# Patient Record
Sex: Female | Born: 1985
Health system: Southern US, Community
[De-identification: ages and names within clinical notes are randomized; demographics above are authoritative.]

## PROBLEM LIST (undated history)

## (undated) ENCOUNTER — Inpatient Hospital Stay (HOSPITAL_COMMUNITY): Payer: Self-pay

## (undated) DIAGNOSIS — D759 Disease of blood and blood-forming organs, unspecified: Secondary | ICD-10-CM

## (undated) DIAGNOSIS — K219 Gastro-esophageal reflux disease without esophagitis: Secondary | ICD-10-CM

## (undated) DIAGNOSIS — D696 Thrombocytopenia, unspecified: Secondary | ICD-10-CM

## (undated) DIAGNOSIS — E079 Disorder of thyroid, unspecified: Secondary | ICD-10-CM

## (undated) DIAGNOSIS — E039 Hypothyroidism, unspecified: Secondary | ICD-10-CM

## (undated) DIAGNOSIS — O99119 Other diseases of the blood and blood-forming organs and certain disorders involving the immune mechanism complicating pregnancy, unspecified trimester: Secondary | ICD-10-CM

## (undated) DIAGNOSIS — Q379 Unspecified cleft palate with unilateral cleft lip: Secondary | ICD-10-CM

## (undated) HISTORY — DX: Unspecified cleft palate with unilateral cleft lip: Q37.9

## (undated) HISTORY — DX: Disorder of thyroid, unspecified: E07.9

---
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
## 2012-12-25 ENCOUNTER — Other Ambulatory Visit: Payer: Self-pay | Admitting: Obstetrics and Gynecology

## 2012-12-27 ENCOUNTER — Ambulatory Visit (INDEPENDENT_AMBULATORY_CARE_PROVIDER_SITE_OTHER): Payer: Commercial Managed Care - PPO | Admitting: Obstetrics and Gynecology

## 2012-12-27 ENCOUNTER — Encounter: Payer: Self-pay | Admitting: Obstetrics and Gynecology

## 2012-12-27 ENCOUNTER — Encounter (INDEPENDENT_AMBULATORY_CARE_PROVIDER_SITE_OTHER): Payer: Self-pay

## 2012-12-27 ENCOUNTER — Other Ambulatory Visit (HOSPITAL_COMMUNITY)
Admission: RE | Admit: 2012-12-27 | Discharge: 2012-12-27 | Disposition: A | Discharge status: Home / Self Care | Payer: 59 | Source: Ambulatory Visit | Attending: Obstetrics and Gynecology | Admitting: Obstetrics and Gynecology

## 2012-12-27 VITALS — BP 120/82 | Ht 65.0 in | Wt 139.0 lb

## 2012-12-27 DIAGNOSIS — Z3200 Encounter for pregnancy test, result unknown: Secondary | ICD-10-CM

## 2012-12-27 DIAGNOSIS — Z113 Encounter for screening for infections with a predominantly sexual mode of transmission: Secondary | ICD-10-CM | POA: Insufficient documentation

## 2012-12-27 DIAGNOSIS — Z01419 Encounter for gynecological examination (general) (routine) without abnormal findings: Secondary | ICD-10-CM | POA: Insufficient documentation

## 2012-12-27 DIAGNOSIS — Z0000 Encounter for general adult medical examination without abnormal findings: Secondary | ICD-10-CM

## 2012-12-27 DIAGNOSIS — Z3201 Encounter for pregnancy test, result positive: Secondary | ICD-10-CM

## 2012-12-27 NOTE — Progress Notes (Signed)
Patient ID: Kimberly Ramsey, female   DOB: 04/01/1985, 27 y.o.   MRN: [id number] Pt here for annual pap and physical. Pt requested a UPT, it was positive. Pt denies any bleeding or cramping.   Assessment:  Annual Gyn Exam   Plan:  1. pap smear done, 2. return annually or prn 3. If termination done, pt to call for contraception. 3    Annual mammogram advised Subjective:  Kimberly Ramsey is a 27 y.o. female G2P2002 who presents for annual exam. Patient's last menstrual period was 11/13/2012. The patient has complaints today of lmp OCT 6  The following portions of the patient's history were reviewed and updated as appropriate: allergies, current medications, past family history, past medical history, past social history, past surgical history and problem list.  Review of Systems Constitutional: negative Gastrointestinal: negative Genitourinary: neg   Objective:  BP 120/82  Ht 5\' 5"  (1.651 m)  Wt 139 lb (63.05 kg)  BMI 23.13 kg/m2  LMP 11/13/2012   BMI: Body mass index is 23.13 kg/(m^2).  General Appearance: Alert, appropriate appearance for age. No acute distress HEENT: Grossly normal Neck / Thyroid:  Cardiovascular: RRR; normal S1, S2, no murmur Lungs: CTA bilaterally Back: No CVAT Breast Exam: defer by pt,  Gastrointestinal: Soft, non-tender, no masses or organomegaly Pelvic Exam: Cervix: normal appearance Adnexa: normal bimanual exam Uterus: retroverted and 4-6 wk size Rectovaginal: not indicated Lymphatic Exam: Non-palpable nodes in neck, clavicular, axillary, or inguinal regions Skin: no rash or abnormalities Neurologic: Normal gait and speech, no tremor  Psychiatric: Alert and oriented, appropriate affect.  Urinalysis:Not done  Christin Bach. MD Pgr [phone] 2:27 PM

## 2013-01-08 ENCOUNTER — Other Ambulatory Visit: Payer: Self-pay | Admitting: Obstetrics & Gynecology

## 2013-01-08 DIAGNOSIS — O3680X0 Pregnancy with inconclusive fetal viability, not applicable or unspecified: Secondary | ICD-10-CM

## 2013-01-10 ENCOUNTER — Encounter: Payer: Self-pay | Admitting: Obstetrics & Gynecology

## 2013-01-10 ENCOUNTER — Other Ambulatory Visit: Payer: Self-pay | Admitting: Obstetrics & Gynecology

## 2013-01-10 ENCOUNTER — Ambulatory Visit (INDEPENDENT_AMBULATORY_CARE_PROVIDER_SITE_OTHER): Payer: Commercial Managed Care - PPO

## 2013-01-10 DIAGNOSIS — O3680X0 Pregnancy with inconclusive fetal viability, not applicable or unspecified: Secondary | ICD-10-CM

## 2013-01-10 DIAGNOSIS — O26849 Uterine size-date discrepancy, unspecified trimester: Secondary | ICD-10-CM

## 2013-01-10 NOTE — Progress Notes (Signed)
U/S-transabdominal u/s performed, single IUP with +FCA noted, FHR-152 bpm, CRL c/w 7+3wks EDD 08/26/2013,  cx long and closed, bilateral adnexa WNL

## 2013-01-16 ENCOUNTER — Ambulatory Visit: Payer: Commercial Managed Care - PPO | Admitting: Women's Health

## 2013-01-16 ENCOUNTER — Encounter: Payer: Self-pay | Admitting: Women's Health

## 2013-01-16 VITALS — BP 130/68 | Wt 138.0 lb

## 2013-01-16 DIAGNOSIS — Z3480 Encounter for supervision of other normal pregnancy, unspecified trimester: Secondary | ICD-10-CM | POA: Insufficient documentation

## 2013-01-16 DIAGNOSIS — Z1389 Encounter for screening for other disorder: Secondary | ICD-10-CM

## 2013-01-16 DIAGNOSIS — O09291 Supervision of pregnancy with other poor reproductive or obstetric history, first trimester: Secondary | ICD-10-CM

## 2013-01-16 DIAGNOSIS — O09299 Supervision of pregnancy with other poor reproductive or obstetric history, unspecified trimester: Secondary | ICD-10-CM | POA: Insufficient documentation

## 2013-01-16 DIAGNOSIS — O219 Vomiting of pregnancy, unspecified: Secondary | ICD-10-CM

## 2013-01-16 DIAGNOSIS — Z331 Pregnant state, incidental: Secondary | ICD-10-CM

## 2013-01-16 DIAGNOSIS — Z8279 Family history of other congenital malformations, deformations and chromosomal abnormalities: Secondary | ICD-10-CM

## 2013-01-16 DIAGNOSIS — Z87730 Personal history of (corrected) cleft lip and palate: Secondary | ICD-10-CM | POA: Insufficient documentation

## 2013-01-16 DIAGNOSIS — Z3481 Encounter for supervision of other normal pregnancy, first trimester: Secondary | ICD-10-CM

## 2013-01-16 HISTORY — DX: Supervision of pregnancy with other poor reproductive or obstetric history, unspecified trimester: O09.299

## 2013-01-16 LAB — POCT URINALYSIS DIPSTICK
Glucose, UA: NEGATIVE
Leukocytes, UA: NEGATIVE
Nitrite, UA: NEGATIVE
Protein, UA: NEGATIVE

## 2013-01-16 LAB — CBC
HCT: 42.3 % (ref 36.0–46.0)
MCHC: 35.2 g/dL (ref 30.0–36.0)
Platelets: 174 10*3/uL (ref 150–400)
RBC: 5.25 MIL/uL — ABNORMAL HIGH (ref 3.87–5.11)
RDW: 14.5 % (ref 11.5–15.5)
WBC: 9.7 10*3/uL (ref 4.0–10.5)

## 2013-01-16 MED ORDER — DOXYLAMINE-PYRIDOXINE 10-10 MG PO TBEC: 10.0000 mg | DELAYED_RELEASE_TABLET | ORAL | Status: DC

## 2013-01-16 MED ORDER — FOLIC ACID 1 MG PO TABS: 4.0000 mg | ORAL_TABLET | Freq: Every day | ORAL | Status: DC

## 2013-01-16 NOTE — Progress Notes (Signed)
  Subjective:    Kimberly Ramsey is a 27 y.o. G69P2002 African American female at [redacted]w[redacted]d by 7wk u/s, being seen today for her first obstetrical visit.  Her obstetrical history is significant for uncomplicated term svd x 2. 6yo daughter w/ down syndrome- normal echo, no problems. Pt born w/ cleft lip/palate, s/p 3 reconstructive surgeries including removal of bone from Lt hip to repair palate.  She also states she had idiopathic thrombocytopenia during both pregnancies.  Pregnancy history fully reviewed. Had neg pap Nov 2014.   Patient reports daily nausea, occ vomiting. Requests antiemetic.  Denies uti s/s, vb, cramping, abnormal/malodorous vag d/c, vulvovaginal itching irritation.   Filed Vitals:   01/16/13 1522  BP: 130/68  Weight: 138 lb (62.596 kg)    HISTORY: OB History  Gravida Para Term Preterm AB SAB TAB Ectopic Multiple Living  3 2 2       2     # Outcome Date GA Lbr Len/2nd Weight Sex Delivery Anes PTL Lv  3 CUR           2 TRM 2013 [redacted]w[redacted]d  7 lb 11 oz (3.487 kg) F SVD   Y  1 TRM 2008 [redacted]w[redacted]d  7 lb 12 oz (3.515 kg) F SVD   Y     Past Medical History  Diagnosis Date  . Cleft lip and palate    Past Surgical History  Procedure Laterality Date  . Cleft palate repair    . Hip surgery      took bone from hip to fix cleft palate.   Family History  Problem Relation Age of Onset  . Mental illness Mother   . Schizophrenia Mother   . Lupus Father      Exam   System:     Skin: normal coloration and turgor, no rashes    Neurologic: oriented, normal mood   Extremities: normal strength, tone, and muscle mass   HEENT PERRLA   Mouth/Teeth mucous membranes moist   Cardiovascular: regular rate and rhythm   Respiratory:  appears well, vitals normal, no respiratory distress, acyanotic, normal RR   Abdomen: soft, non-tender    Thin prep pap smear not obtained, neg pap Nov 2014    Assessment:    Pregnancy: [id number] Patient Active Problem List   Diagnosis Date Noted  .  Supervision of other normal pregnancy 01/16/2013    Priority: High  . Down syndrome in child of prior pregnancy, currently pregnant 01/16/2013    Priority: High  . H/O cleft lip/palate 01/16/2013    Priority: High      [redacted]w[redacted]d G3P2002 New OB visit Cleft lip/palate s/p 3 reconstructive surgeries Child w/ down syndrome N/V pregnancy     Plan:     Initial labs drawn Continue prenatal vitamins Problem list reviewed and updated Reviewed n/v relief measures and warning s/s to report Rx diclegis, prior auth sent, and 4 samples given.  Reviewed recommended weight gain based on pre-gravid BMI Encouraged well-balanced diet Genetic Screening discussed Integrated Screen: requested Cystic fibrosis screening discussed requested Ultrasound discussed; fetal survey: requested Follow up in 4 weeks for 1st IT/NT and lrob Rx folic acid 4mg  daily   Marge Duncans 01/16/2013 4:04 PM

## 2013-01-16 NOTE — Patient Instructions (Signed)
Nausea & Vomiting  Have saltine crackers or pretzels by your bed and eat a few bites before you raise your head out of bed in the morning  Eat small frequent meals throughout the day instead of large meals  Drink plenty of fluids throughout the day to stay hydrated, just don't drink a lot of fluids with your meals.  This can make your stomach fill up faster making you feel sick  Do not brush your teeth right after you eat  Products with real ginger are good for nausea, like ginger ale and ginger hard candy Make sure it says made with real ginger!  Sucking on sour candy like lemon heads is also good for nausea  If your prenatal vitamins make you nauseated, take them at night so you will sleep through the nausea  If you feel like you need medicine for the nausea & vomiting please let us know  If you are unable to keep any fluids or food down please let us know   Constipation  Drink plenty of fluid, preferably water, throughout the day  Eat foods high in fiber such as fruits, vegetables, and grains  Exercise, such as walking, is a good way to keep your bowels regular  Drink warm fluids, especially warm prune juice, or decaf coffee  Eat a 1/2 cup of real oatmeal (not instant), 1/2 cup applesauce, and 1/2-1 cup warm prune juice every day  If needed, you may take Colace (docusate sodium) stool softener once or twice a day to help keep the stool soft. If you are pregnant, wait until you are out of your first trimester (12-14 weeks of pregnancy)  If you still are having problems with constipation, you may take Miralax once daily as needed to help keep your bowels regular.  If you are pregnant, wait until you are out of your first trimester (12-14 weeks of pregnancy)    Pregnancy - First Trimester During sexual intercourse, millions of sperm go into the vagina. Only 1 sperm will penetrate and fertilize the female egg while it is in the Fallopian tube. One week later, the fertilized egg  implants into the wall of the uterus. An embryo begins to develop into a baby. At 6 to 8 weeks, the eyes and face are formed and the heartbeat can be seen on ultrasound. At the end of 12 weeks (first trimester), all the baby's organs are formed. Now that you are pregnant, you will want to do everything you can to have a healthy baby. Two of the most important things are to get good prenatal care and follow your caregiver's instructions. Prenatal care is all the medical care you receive before the baby's birth. It is given to prevent, find, and treat problems during the pregnancy and childbirth. PRENATAL EXAMS  During prenatal visits, your weight, blood pressure, and urine are checked. This is done to make sure you are healthy and progressing normally during the pregnancy.  A pregnant woman should gain 25 to 35 pounds during the pregnancy. However, if you are overweight or underweight, your caregiver will advise you regarding your weight.  Your caregiver will ask and answer questions for you.  Blood work, cervical cultures, other necessary tests, and a Pap test are done during your prenatal exams. These tests are done to check on your health and the probable health of your baby. Tests are strongly recommended and done for HIV with your permission. This is the virus that causes AIDS. These tests are done because medicines   can be given to help prevent your baby from being born with this infection should you have been infected without knowing it. Blood work is also used to find out your blood type, previous infections, and follow your blood levels (hemoglobin).  Low hemoglobin (anemia) is common during pregnancy. Iron and vitamins are given to help prevent this. Later in the pregnancy, blood tests for diabetes will be done along with any other tests if any problems develop.  You may need other tests to make sure you and the baby are doing well. CHANGES DURING THE FIRST TRIMESTER  Your body goes through  many changes during pregnancy. They vary from person to person. Talk to your caregiver about changes you notice and are concerned about. Changes can include:  Your menstrual period stops.  The egg and sperm carry the genes that determine what you look like. Genes from you and your partner are forming a baby. The female genes determine whether the baby is a boy or a girl.  Your body increases in girth and you may feel bloated.  Feeling sick to your stomach (nauseous) and throwing up (vomiting). If the vomiting is uncontrollable, call your caregiver.  Your breasts will begin to enlarge and become tender.  Your nipples may stick out more and become darker.  The need to urinate more. Painful urination may mean you have a bladder infection.  Tiring easily.  Loss of appetite.  Cravings for certain kinds of food.  At first, you may gain or lose a couple of pounds.  You may have changes in your emotions from day to day (excited to be pregnant or concerned something may go wrong with the pregnancy and baby).  You may have more vivid and strange dreams. HOME CARE INSTRUCTIONS   It is very important to avoid all smoking, alcohol and non-prescribed drugs during your pregnancy. These affect the formation and growth of the baby. Avoid chemicals while pregnant to ensure the delivery of a healthy infant.  Start your prenatal visits by the 12th week of pregnancy. They are usually scheduled monthly at first, then more often in the last 2 months before delivery. Keep your caregiver's appointments. Follow your caregiver's instructions regarding medicine use, blood and lab tests, exercise, and diet.  During pregnancy, you are providing food for you and your baby. Eat regular, well-balanced meals. Choose foods such as meat, fish, milk and other low fat dairy products, vegetables, fruits, and whole-grain breads and cereals. Your caregiver will tell you of the ideal weight gain.  You can help morning  sickness by keeping soda crackers at the bedside. Eat a couple before arising in the morning. You may want to use the crackers without salt on them.  Eating 4 to 5 small meals rather than 3 large meals a day also may help the nausea and vomiting.  Drinking liquids between meals instead of during meals also seems to help nausea and vomiting.  A physical sexual relationship may be continued throughout pregnancy if there are no other problems. Problems may be early (premature) leaking of amniotic fluid from the membranes, vaginal bleeding, or belly (abdominal) pain.  Exercise regularly if there are no restrictions. Check with your caregiver or physical therapist if you are unsure of the safety of some of your exercises. Greater weight gain will occur in the last 2 trimesters of pregnancy. Exercising will help:  Control your weight.  Keep you in shape.  Prepare you for labor and delivery.  Help you lose your pregnancy   weight after you deliver your baby.  Wear a good support or jogging bra for breast tenderness during pregnancy. This may help if worn during sleep too.  Ask when prenatal classes are available. Begin classes when they are offered.  Do not use hot tubs, steam rooms, or saunas.  Wear your seat belt when driving. This protects you and your baby if you are in an accident.  Avoid raw meat, uncooked cheese, cat litter boxes, and soil used by cats throughout the pregnancy. These carry germs that can cause birth defects in the baby.  The first trimester is a good time to visit your dentist for your dental health. Getting your teeth cleaned is okay. Use a softer toothbrush and brush gently during pregnancy.  Ask for help if you have financial, counseling, or nutritional needs during pregnancy. Your caregiver will be able to offer counseling for these needs as well as refer you for other special needs.  Do not take any medicines or herbs unless told by your caregiver.  Inform your  caregiver if there is any mental or physical domestic violence.  Make a list of emergency phone numbers of family, friends, hospital, and police and fire departments.  Write down your questions. Take them to your prenatal visit.  Do not douche.  Do not cross your legs.  If you have to stand for long periods of time, rotate you feet or take small steps in a circle.  You may have more vaginal secretions that may require a sanitary pad. Do not use tampons or scented sanitary pads. MEDICINES AND DRUG USE IN PREGNANCY  Take prenatal vitamins as directed. The vitamin should contain 1 milligram of folic acid. Keep all vitamins out of reach of children. Only a couple vitamins or tablets containing iron may be fatal to a baby or young child when ingested.  Avoid use of all medicines, including herbs, over-the-counter medicines, not prescribed or suggested by your caregiver. Only take over-the-counter or prescription medicines for pain, discomfort, or fever as directed by your caregiver. Do not use aspirin, ibuprofen, or naproxen unless directed by your caregiver.  Let your caregiver also know about herbs you may be using.  Alcohol is related to a number of birth defects. This includes fetal alcohol syndrome. All alcohol, in any form, should be avoided completely. Smoking will cause low birth rate and premature babies.  Street or illegal drugs are very harmful to the baby. They are absolutely forbidden. A baby born to an addicted mother will be addicted at birth. The baby will go through the same withdrawal an adult does.  Let your caregiver know about any medicines that you have to take and for what reason you take them. SEEK MEDICAL CARE IF:  You have any concerns or worries during your pregnancy. It is better to call with your questions if you feel they cannot wait, rather than worry about them. SEEK IMMEDIATE MEDICAL CARE IF:   An unexplained oral temperature above 102 F (38.9 C) develops,  or as your caregiver suggests.  You have leaking of fluid from the vagina (birth canal). If leaking membranes are suspected, take your temperature and inform your caregiver of this when you call.  There is vaginal spotting or bleeding. Notify your caregiver of the amount and how many pads are used.  You develop a bad smelling vaginal discharge with a change in the color.  You continue to feel sick to your stomach (nauseated) and have no relief from remedies suggested. You   vomit blood or coffee ground-like materials.  You lose more than 2 pounds of weight in 1 week.  You gain more than 2 pounds of weight in 1 week and you notice swelling of your face, hands, feet, or legs.  You gain 5 pounds or more in 1 week (even if you do not have swelling of your hands, face, legs, or feet).  You get exposed to German measles and have never had them.  You are exposed to fifth disease or chickenpox.  You develop belly (abdominal) pain. Round ligament discomfort is a common non-cancerous (benign) cause of abdominal pain in pregnancy. Your caregiver still must evaluate this.  You develop headache, fever, diarrhea, pain with urination, or shortness of breath.  You fall or are in a car accident or have any kind of trauma.  There is mental or physical violence in your home. Document Released: 01/19/2001 Document Revised: 10/20/2011 Document Reviewed: 07/23/2008 [employer] Patient Information 2014 [employer].  

## 2013-01-17 LAB — DRUG SCREEN, URINE, NO CONFIRMATION
Barbiturate Quant, Ur: NEGATIVE
Benzodiazepines.: NEGATIVE
Cocaine Metabolites: NEGATIVE
Creatinine,U: 151.1 mg/dL
Methadone: NEGATIVE
Opiate Screen, Urine: NEGATIVE

## 2013-01-17 LAB — SICKLE CELL SCREEN: Sickle Cell Screen: NEGATIVE

## 2013-01-17 LAB — URINALYSIS
Hgb urine dipstick: NEGATIVE
Ketones, ur: 15 mg/dL — AB
Leukocytes, UA: NEGATIVE
Nitrite: NEGATIVE
Protein, ur: NEGATIVE mg/dL
Specific Gravity, Urine: 1.024 (ref 1.005–1.030)
Urobilinogen, UA: 0.2 mg/dL (ref 0.0–1.0)

## 2013-01-17 LAB — OXYCODONE SCREEN, UA, RFLX CONFIRM: Oxycodone Screen, Ur: NEGATIVE ng/mL

## 2013-01-17 LAB — RUBELLA SCREEN: Rubella: 4.21 Index — ABNORMAL HIGH (ref <0.90)

## 2013-01-17 LAB — HEPATITIS B SURFACE ANTIGEN: Hepatitis B Surface Ag: NEGATIVE

## 2013-01-17 LAB — ANTIBODY SCREEN: Antibody Screen: NEGATIVE

## 2013-01-17 LAB — ABO AND RH: Rh Type: POSITIVE

## 2013-01-17 LAB — VARICELLA ZOSTER ANTIBODY, IGG: Varicella IgG: 37.42 Index (ref <135.00)

## 2013-01-17 LAB — RPR

## 2013-01-18 LAB — CYSTIC FIBROSIS DIAGNOSTIC STUDY

## 2013-01-18 LAB — URINE CULTURE: Colony Count: NO GROWTH

## 2013-01-19 ENCOUNTER — Encounter: Payer: Self-pay | Admitting: Women's Health

## 2013-01-19 DIAGNOSIS — O09899 Supervision of other high risk pregnancies, unspecified trimester: Secondary | ICD-10-CM | POA: Insufficient documentation

## 2013-01-19 DIAGNOSIS — Z2839 Other underimmunization status: Secondary | ICD-10-CM | POA: Insufficient documentation

## 2013-01-19 DIAGNOSIS — Z283 Underimmunization status: Secondary | ICD-10-CM

## 2013-01-28 ENCOUNTER — Emergency Department (HOSPITAL_COMMUNITY)
Admission: EM | Admit: 2013-01-28 | Discharge: 2013-01-29 | Disposition: A | Discharge status: Home / Self Care | Payer: 59 | Attending: Emergency Medicine | Admitting: Emergency Medicine

## 2013-01-28 ENCOUNTER — Encounter (HOSPITAL_COMMUNITY): Payer: Self-pay | Admitting: Emergency Medicine

## 2013-01-28 DIAGNOSIS — Z79899 Other long term (current) drug therapy: Secondary | ICD-10-CM | POA: Insufficient documentation

## 2013-01-28 DIAGNOSIS — R42 Dizziness and giddiness: Secondary | ICD-10-CM | POA: Diagnosis not present

## 2013-01-28 DIAGNOSIS — R112 Nausea with vomiting, unspecified: Secondary | ICD-10-CM

## 2013-01-28 DIAGNOSIS — O210 Mild hyperemesis gravidarum: Secondary | ICD-10-CM | POA: Diagnosis present

## 2013-01-28 DIAGNOSIS — R000 Tachycardia, unspecified: Secondary | ICD-10-CM | POA: Insufficient documentation

## 2013-01-28 DIAGNOSIS — Z87798 Personal history of other (corrected) congenital malformations: Secondary | ICD-10-CM | POA: Insufficient documentation

## 2013-01-28 DIAGNOSIS — Z3490 Encounter for supervision of normal pregnancy, unspecified, unspecified trimester: Secondary | ICD-10-CM

## 2013-01-28 LAB — CBC WITH DIFFERENTIAL/PLATELET
Eosinophils Absolute: 0 10*3/uL (ref 0.0–0.7)
HCT: 43.8 % (ref 36.0–46.0)
Hemoglobin: 15.1 g/dL — ABNORMAL HIGH (ref 12.0–15.0)
Lymphocytes Relative: 12 % (ref 12–46)
Lymphs Abs: 1.2 10*3/uL (ref 0.7–4.0)
MCH: 28.9 pg (ref 26.0–34.0)
MCV: 83.7 fL (ref 78.0–100.0)
Monocytes Absolute: 0.6 10*3/uL (ref 0.1–1.0)
Monocytes Relative: 6 % (ref 3–12)
Neutrophils Relative %: 82 % — ABNORMAL HIGH (ref 43–77)
Platelets: 150 10*3/uL (ref 150–400)
RBC: 5.23 MIL/uL — ABNORMAL HIGH (ref 3.87–5.11)
WBC: 10.2 10*3/uL (ref 4.0–10.5)

## 2013-01-28 LAB — URINALYSIS, ROUTINE W REFLEX MICROSCOPIC
Bilirubin Urine: NEGATIVE
Glucose, UA: NEGATIVE mg/dL
Hgb urine dipstick: NEGATIVE
Ketones, ur: >80 mg/dL — AB
Nitrite: NEGATIVE
Specific Gravity, Urine: 1.025 (ref 1.005–1.030)
Urobilinogen, UA: 0.2 mg/dL (ref 0.0–1.0)
pH: 6 (ref 5.0–8.0)

## 2013-01-28 LAB — BASIC METABOLIC PANEL
BUN: 11 mg/dL (ref 6–23)
CO2: 23 mEq/L (ref 19–32)
Chloride: 96 mEq/L (ref 96–112)
Creatinine, Ser: 0.5 mg/dL (ref 0.50–1.10)
Glucose, Bld: 76 mg/dL (ref 70–99)
Potassium: 3.4 mEq/L — ABNORMAL LOW (ref 3.5–5.1)
Sodium: 135 mEq/L (ref 135–145)

## 2013-01-28 MED ORDER — PROMETHAZINE HCL 25 MG PO TABS: 25.0000 mg | ORAL_TABLET | Freq: Four times a day (QID) | ORAL | Status: DC | PRN

## 2013-01-28 MED ORDER — SODIUM CHLORIDE 0.9 % IV SOLN
Freq: Once | INTRAVENOUS | Status: AC
  Administered 2013-01-28: 1000 mL via INTRAVENOUS

## 2013-01-28 MED ORDER — ACETAMINOPHEN 500 MG PO TABS
ORAL_TABLET | ORAL | Status: AC
  Administered 2013-01-28: 1000 mg
  Filled 2013-01-28: qty 2

## 2013-01-28 MED ORDER — ONDANSETRON HCL 4 MG/2ML IJ SOLN
INTRAMUSCULAR | Status: AC
  Administered 2013-01-28: 4 mg
  Filled 2013-01-28: qty 2

## 2013-01-28 MED ORDER — SODIUM CHLORIDE 0.9 % IV SOLN
INTRAVENOUS | Status: DC
  Administered 2013-01-28: 500 mL via INTRAVENOUS

## 2013-01-28 NOTE — ED Notes (Signed)
Heart rate still 120's with any movement on stretcher. ERMD aware and 4th liter up. Pt c/o headache, 4/10

## 2013-01-28 NOTE — ED Provider Notes (Signed)
CSN: [id number]     Arrival date & time 01/28/13  1908 History   First MD Initiated Contact with Patient 01/28/13 1930     Chief Complaint  Patient presents with  . Emesis   (Consider location/radiation/quality/duration/timing/severity/associated sxs/prior Treatment) HPI.... G3 P2 approximately 3 months pregnant presents with vomiting at work today.   She felt lightheaded.  No syncope, dysuria, chest pain, dyspnea. She has had an uncomplicated maternal course thus far. No vaginal bleeding, vaginal discharge.  Recent ultrasound reveals an intrauterine pregnancy.  She is alert and talkative in emergency department.  Past Medical History  Diagnosis Date  . Cleft lip and palate    Past Surgical History  Procedure Laterality Date  . Cleft palate repair    . Hip surgery      took bone from hip to fix cleft palate.   Family History  Problem Relation Age of Onset  . Mental illness Mother   . Schizophrenia Mother   . Lupus Father    History  Substance Use Topics  . Smoking status: Never Smoker   . Smokeless tobacco: Never Used  . Alcohol Use: No   OB History   Grav Para Term Preterm Abortions TAB SAB Ect Mult Living   3 2 2       2      Review of Systems  All other systems reviewed and are negative.    Allergies  Codeine  Home Medications   Current Outpatient Rx  Name  Route  Sig  Dispense  Refill  . Doxylamine-Pyridoxine (DICLEGIS) 10-10 MG TBEC   Oral   Take 10 mg by mouth See admin instructions.   100 tablet   4     2 tabs q hs, if sx persist add 1 tab q am on day 3 ...   . Prenatal Vit-Iron Carbonyl-FA (OB COMPLETE PO)   Oral   Take by mouth daily.         . folic acid (FOLVITE) 1 MG tablet   Oral   Take 4 tablets (4 mg total) by mouth daily.   120 tablet   12   . promethazine (PHENERGAN) 25 MG tablet   Oral   Take 1 tablet (25 mg total) by mouth every 6 (six) hours as needed for nausea or vomiting.   15 tablet   0    BP 130/67  Pulse 123   Temp(Src) 98.2 F (36.8 C)  Ht 5\' 5"  (1.651 m)  Wt 138 lb (62.596 kg)  BMI 22.96 kg/m2  SpO2 100%  LMP 11/13/2012 Physical Exam  Nursing note and vitals reviewed. Constitutional: She is oriented to person, place, and time. She appears well-developed and well-nourished.  Tachycardic, looks well  HENT:  Head: Normocephalic and atraumatic.  Eyes: Conjunctivae and EOM are normal. Pupils are equal, round, and reactive to light.  Neck: Normal range of motion. Neck supple.  Cardiovascular: Regular rhythm and normal heart sounds.   Tachycardic  Pulmonary/Chest: Effort normal and breath sounds normal.  Abdominal: Soft. Bowel sounds are normal.  Musculoskeletal: Normal range of motion.  Neurological: She is alert and oriented to person, place, and time.  Skin: Skin is warm and dry.  Psychiatric: She has a normal mood and affect. Her behavior is normal.    ED Course  Procedures (including critical care time) Labs Review Labs Reviewed - No data to display Imaging Review No results found.  EKG Interpretation   None       MDM   1.  Nausea and vomiting    Patient is alert and talkative. Good color. No acute distress. She feels better after multiple liters of hydration with normal saline. Blood pressures remain normal. Oxygenation 100%. Pulse still in low 100s. No clinical evidence of pulmonary embolism. Recent  normal intrauterine pregnancy via ultrasound    Donnetta Hutching, MD 01/28/13 2158

## 2013-01-28 NOTE — ED Notes (Signed)
Patient states she is less than 3 months pregnant and states that she came to work and began vomiting.  Patient states had a feeling that she was going to pass out.

## 2013-01-28 NOTE — ED Notes (Signed)
Up to bathroom, able to urinate

## 2013-01-29 MED ORDER — SODIUM CHLORIDE 0.9 % IV SOLN
Freq: Once | INTRAVENOUS | Status: AC
  Administered 2013-01-28: 1000 mL via INTRAVENOUS

## 2013-01-29 NOTE — ED Provider Notes (Signed)
Care assumed from Dr. Adriana Simas at shift change. Patient is [redacted] weeks pregnant and is exhibiting what appears to be hyperemesis. She was given several liters of normal saline and antibiotics in the emergency department. She is feeling better however has remained somewhat tachycardic. I am unsure of the significance of this as she feels well and has urinated twice while in the ER. She is having no chest pain and no shortness of breath that makes me think pulmonary embolism. She was ambulated in the emergency department and her oxygen saturations were adequate and heart rate increased to the 120s from her baseline tachycardia of 100-110. At this point I feel as though she is stable for discharge. Her laboratory studies are unremarkable and she agrees to return if she develops any new or concerning symptoms.  Geoffery Lyons, MD 01/29/13 [phone]

## 2013-01-29 NOTE — ED Notes (Signed)
Tolerated walk around nurses station, denies SOB or dizziness, weakness. Heart rate not above 129. Dr Judd Lien aware and pt now ready for discharge.

## 2013-02-08 NOTE — L&D Delivery Note (Signed)
Delivery Note At 6:06 PM a viable female was delivered via Vaginal, Spontaneous Delivery (Presentation: ; Occiput Anterior).  APGAR: 7, 9; weight TBD.   Placenta status: Intact, Spontaneous.  Cord: 3 vessels with the following complications: none.   Anesthesia: None  Episiotomy: None Lacerations: None Suture Repair: na Est. Blood Loss (mL): 300  Mom to postpartum.  Baby to Couplet care / Skin to Skin.  Pt pushed with good maternal effort to deliver a liveborn female via NSVD with spontaneous cry.   Baby placed on maternal abdomen.  Delayed cord clamping performed.  Cord cut by FOB.  Placenta delivered intact with 3V cord via traction and pitocin.  no tears. No complications.  Mom and baby to postpartum.   Tanner Yeley L 08/17/2013, 6:20 PM

## 2013-02-13 ENCOUNTER — Other Ambulatory Visit: Payer: Self-pay | Admitting: Women's Health

## 2013-02-13 ENCOUNTER — Other Ambulatory Visit: Payer: Self-pay | Admitting: Advanced Practice Midwife

## 2013-02-13 ENCOUNTER — Encounter (INDEPENDENT_AMBULATORY_CARE_PROVIDER_SITE_OTHER): Payer: Self-pay

## 2013-02-13 ENCOUNTER — Encounter: Payer: Self-pay | Admitting: Advanced Practice Midwife

## 2013-02-13 ENCOUNTER — Ambulatory Visit (INDEPENDENT_AMBULATORY_CARE_PROVIDER_SITE_OTHER): Payer: Commercial Managed Care - PPO | Admitting: Advanced Practice Midwife

## 2013-02-13 ENCOUNTER — Ambulatory Visit (INDEPENDENT_AMBULATORY_CARE_PROVIDER_SITE_OTHER): Payer: Commercial Managed Care - PPO

## 2013-02-13 VITALS — BP 126/62 | Wt 133.0 lb

## 2013-02-13 DIAGNOSIS — Z331 Pregnant state, incidental: Secondary | ICD-10-CM

## 2013-02-13 DIAGNOSIS — Z8279 Family history of other congenital malformations, deformations and chromosomal abnormalities: Secondary | ICD-10-CM

## 2013-02-13 DIAGNOSIS — O09291 Supervision of pregnancy with other poor reproductive or obstetric history, first trimester: Secondary | ICD-10-CM

## 2013-02-13 DIAGNOSIS — O09299 Supervision of pregnancy with other poor reproductive or obstetric history, unspecified trimester: Secondary | ICD-10-CM

## 2013-02-13 DIAGNOSIS — Z87730 Personal history of (corrected) cleft lip and palate: Secondary | ICD-10-CM

## 2013-02-13 DIAGNOSIS — Z36 Encounter for antenatal screening of mother: Secondary | ICD-10-CM

## 2013-02-13 DIAGNOSIS — Z3481 Encounter for supervision of other normal pregnancy, first trimester: Secondary | ICD-10-CM

## 2013-02-13 DIAGNOSIS — Z1389 Encounter for screening for other disorder: Secondary | ICD-10-CM

## 2013-02-13 LAB — POCT URINALYSIS DIPSTICK
Glucose, UA: NEGATIVE
Ketones, UA: NEGATIVE
Leukocytes, UA: NEGATIVE
NITRITE UA: NEGATIVE
RBC UA: NEGATIVE

## 2013-02-13 NOTE — Progress Notes (Signed)
U/S(12+2wks)-single IUP with +FCA noted, FHR-167 bpm, cx long and closed (3.5cm), bilateral adnexa wnl, CRL c/w dates, NB present,NT-1.3644mm, anterior GR 0 placenta

## 2013-02-13 NOTE — Progress Notes (Signed)
Had IT/NT today.    No c/o at this time.  Routine questions about pregnancy answered.  F/U in 4 weeks for 2nd IT/NT.

## 2013-02-17 LAB — MATERNAL SCREEN, INTEGRATED #1

## 2013-03-14 ENCOUNTER — Other Ambulatory Visit: Payer: Self-pay | Admitting: Women's Health

## 2013-03-14 ENCOUNTER — Encounter (INDEPENDENT_AMBULATORY_CARE_PROVIDER_SITE_OTHER): Payer: Self-pay

## 2013-03-14 ENCOUNTER — Ambulatory Visit (INDEPENDENT_AMBULATORY_CARE_PROVIDER_SITE_OTHER): Payer: Medicaid Other | Admitting: Women's Health

## 2013-03-14 VITALS — BP 110/60 | Wt 134.5 lb

## 2013-03-14 DIAGNOSIS — O09299 Supervision of pregnancy with other poor reproductive or obstetric history, unspecified trimester: Secondary | ICD-10-CM

## 2013-03-14 DIAGNOSIS — Z1389 Encounter for screening for other disorder: Secondary | ICD-10-CM

## 2013-03-14 DIAGNOSIS — Z331 Pregnant state, incidental: Secondary | ICD-10-CM

## 2013-03-14 DIAGNOSIS — Z8279 Family history of other congenital malformations, deformations and chromosomal abnormalities: Secondary | ICD-10-CM

## 2013-03-14 DIAGNOSIS — Z3480 Encounter for supervision of other normal pregnancy, unspecified trimester: Secondary | ICD-10-CM

## 2013-03-14 LAB — POCT URINALYSIS DIPSTICK
Blood, UA: NEGATIVE
Glucose, UA: NEGATIVE
Leukocytes, UA: NEGATIVE
Nitrite, UA: NEGATIVE
Protein, UA: NEGATIVE

## 2013-03-14 MED ORDER — OB COMPLETE 50-1.25 MG PO TABS: ORAL_TABLET | ORAL | Status: DC

## 2013-03-14 NOTE — Progress Notes (Signed)
Denies cramping, lof, vb, uti s/s.  Constipation, reviewed prevention/relief measures.  Reviewed warning s/s to report.  All questions answered. F/U in 4wks for anatomy u/s and visit. 2nd IT today.

## 2013-03-14 NOTE — Patient Instructions (Addendum)
Constipation  Drink plenty of fluid, preferably water, throughout the day  Eat foods high in fiber such as fruits, vegetables, and grains  Exercise, such as walking, is a good way to keep your bowels regular  Drink warm fluids, especially warm prune juice, or decaf coffee  Eat a 1/2 cup of real oatmeal (not instant), 1/2 cup applesauce, and 1/2-1 cup warm prune juice every day  If needed, you may take Colace (docusate sodium) stool softener once or twice a day to help keep the stool soft. If you are pregnant, wait until you are out of your first trimester (12-14 weeks of pregnancy)  If you still are having problems with constipation, you may take Miralax once daily as needed to help keep your bowels regular.  If you are pregnant, wait until you are out of your first trimester (12-14 weeks of pregnancy)    Second Trimester of Pregnancy The second trimester is from week 13 through week 28, months 4 through 6. The second trimester is often a time when you feel your best. Your body has also adjusted to being pregnant, and you begin to feel better physically. Usually, morning sickness has lessened or quit completely, you may have more energy, and you may have an increase in appetite. The second trimester is also a time when the fetus is growing rapidly. At the end of the sixth month, the fetus is about 9 inches long and weighs about 1 pounds. You will likely begin to feel the baby move (quickening) between 18 and 20 weeks of the pregnancy. BODY CHANGES Your body goes through many changes during pregnancy. The changes vary from woman to woman.   Your weight will continue to increase. You will notice your lower abdomen bulging out.  You may begin to get stretch marks on your hips, abdomen, and breasts.  You may develop headaches that can be relieved by medicines approved by your caregiver.  You may urinate more often because the fetus is pressing on your bladder.  You may develop or continue  to have heartburn as a result of your pregnancy.  You may develop constipation because certain hormones are causing the muscles that push waste through your intestines to slow down.  You may develop hemorrhoids or swollen, bulging veins (varicose veins).  You may have back pain because of the weight gain and pregnancy hormones relaxing your joints between the bones in your pelvis and as a result of a shift in weight and the muscles that support your balance.  Your breasts will continue to grow and be tender.  Your gums may bleed and may be sensitive to brushing and flossing.  Dark spots or blotches (chloasma, mask of pregnancy) may develop on your face. This will likely fade after the baby is born.  A dark line from your belly button to the pubic area (linea nigra) may appear. This will likely fade after the baby is born. WHAT TO EXPECT AT YOUR PRENATAL VISITS During a routine prenatal visit:  You will be weighed to make sure you and the fetus are growing normally.  Your blood pressure will be taken.  Your abdomen will be measured to track your baby's growth.  The fetal heartbeat will be listened to.  Any test results from the previous visit will be discussed. Your caregiver may ask you:  How you are feeling.  If you are feeling the baby move.  If you have had any abnormal symptoms, such as leaking fluid, bleeding, severe headaches, or  abdominal cramping.  If you have any questions. Other tests that may be performed during your second trimester include:  Blood tests that check for:  Low iron levels (anemia).  Gestational diabetes (between 24 and 28 weeks).  Rh antibodies.  Urine tests to check for infections, diabetes, or protein in the urine.  An ultrasound to confirm the proper growth and development of the baby.  An amniocentesis to check for possible genetic problems.  Fetal screens for spina bifida and Down syndrome. HOME CARE INSTRUCTIONS   Avoid all  smoking, herbs, alcohol, and unprescribed drugs. These chemicals affect the formation and growth of the baby.  Follow your caregiver's instructions regarding medicine use. There are medicines that are either safe or unsafe to take during pregnancy.  Exercise only as directed by your caregiver. Experiencing uterine cramps is a good sign to stop exercising.  Continue to eat regular, healthy meals.  Wear a good support bra for breast tenderness.  Do not use hot tubs, steam rooms, or saunas.  Wear your seat belt at all times when driving.  Avoid raw meat, uncooked cheese, cat litter boxes, and soil used by cats. These carry germs that can cause birth defects in the baby.  Take your prenatal vitamins.  Try taking a stool softener (if your caregiver approves) if you develop constipation. Eat more high-fiber foods, such as fresh vegetables or fruit and whole grains. Drink plenty of fluids to keep your urine clear or pale yellow.  Take warm sitz baths to soothe any pain or discomfort caused by hemorrhoids. Use hemorrhoid cream if your caregiver approves.  If you develop varicose veins, wear support hose. Elevate your feet for 15 minutes, 3 4 times a day. Limit salt in your diet.  Avoid heavy lifting, wear low heel shoes, and practice good posture.  Rest with your legs elevated if you have leg cramps or low back pain.  Visit your dentist if you have not gone yet during your pregnancy. Use a soft toothbrush to brush your teeth and be gentle when you floss.  A sexual relationship may be continued unless your caregiver directs you otherwise.  Continue to go to all your prenatal visits as directed by your caregiver. SEEK MEDICAL CARE IF:   You have dizziness.  You have mild pelvic cramps, pelvic pressure, or nagging pain in the abdominal area.  You have persistent nausea, vomiting, or diarrhea.  You have a bad smelling vaginal discharge.  You have pain with urination. SEEK IMMEDIATE  MEDICAL CARE IF:   You have a fever.  You are leaking fluid from your vagina.  You have spotting or bleeding from your vagina.  You have severe abdominal cramping or pain.  You have rapid weight gain or loss.  You have shortness of breath with chest pain.  You notice sudden or extreme swelling of your face, hands, ankles, feet, or legs.  You have not felt your baby move in over an hour.  You have severe headaches that do not go away with medicine.  You have vision changes. Document Released: 01/19/2001 Document Revised: 09/27/2012 Document Reviewed: 03/28/2012 [facility] Patient Information 2014 [place], [place].

## 2013-03-19 LAB — MATERNAL SCREEN, INTEGRATED #2
AFP MOM MAT SCREEN: 1.05
AFP, Serum: 46.3 ng/mL
Calculated Gestational Age: 16.7
Crown Rump Length: 62.5 mm
ESTRIOL MOM MAT SCREEN: 1.68
Estriol, Free: 1.73 ng/mL
INHIBIN A MOM MAT SCREEN: 1.09
Inhibin A Dimeric: 188 pg/mL
NT MoM: 1.01
NUCHAL TRANSLUCENCY MAT SCREEN 2: 1.44 mm
NUMBER OF FETUSES MAT SCREEN 2: 1
PAPP-A MAT SCREEN: 1541 ng/mL
PAPP-A MOM MAT SCREEN: 1.02
Rish for ONTD: <1:5000 {titer}
hCG MoM: 1.34
hCG, Serum: 52 IU/mL

## 2013-03-20 ENCOUNTER — Encounter: Payer: Self-pay | Admitting: Women's Health

## 2013-04-10 ENCOUNTER — Ambulatory Visit (INDEPENDENT_AMBULATORY_CARE_PROVIDER_SITE_OTHER): Payer: Medicaid Other

## 2013-04-10 ENCOUNTER — Other Ambulatory Visit: Payer: Self-pay | Admitting: Women's Health

## 2013-04-10 ENCOUNTER — Ambulatory Visit (INDEPENDENT_AMBULATORY_CARE_PROVIDER_SITE_OTHER): Payer: Medicaid Other | Admitting: Women's Health

## 2013-04-10 ENCOUNTER — Encounter: Payer: Self-pay | Admitting: Women's Health

## 2013-04-10 VITALS — BP 108/54 | Wt 140.6 lb

## 2013-04-10 DIAGNOSIS — Z1389 Encounter for screening for other disorder: Secondary | ICD-10-CM

## 2013-04-10 DIAGNOSIS — Z3480 Encounter for supervision of other normal pregnancy, unspecified trimester: Secondary | ICD-10-CM

## 2013-04-10 DIAGNOSIS — Z331 Pregnant state, incidental: Secondary | ICD-10-CM

## 2013-04-10 DIAGNOSIS — O09299 Supervision of pregnancy with other poor reproductive or obstetric history, unspecified trimester: Secondary | ICD-10-CM

## 2013-04-10 DIAGNOSIS — Z87798 Personal history of other (corrected) congenital malformations: Secondary | ICD-10-CM

## 2013-04-10 DIAGNOSIS — O09292 Supervision of pregnancy with other poor reproductive or obstetric history, second trimester: Secondary | ICD-10-CM

## 2013-04-10 DIAGNOSIS — Z8279 Family history of other congenital malformations, deformations and chromosomal abnormalities: Secondary | ICD-10-CM

## 2013-04-10 LAB — POCT URINALYSIS DIPSTICK
Glucose, UA: NEGATIVE
KETONES UA: NEGATIVE
Leukocytes, UA: NEGATIVE
Nitrite, UA: NEGATIVE
PROTEIN UA: NEGATIVE
RBC UA: NEGATIVE

## 2013-04-10 NOTE — Patient Instructions (Signed)
Second Trimester of Pregnancy The second trimester is from week 13 through week 28, months 4 through 6. The second trimester is often a time when you feel your best. Your body has also adjusted to being pregnant, and you begin to feel better physically. Usually, morning sickness has lessened or quit completely, you may have more energy, and you may have an increase in appetite. The second trimester is also a time when the fetus is growing rapidly. At the end of the sixth month, the fetus is about 9 inches long and weighs about 1 pounds. You will likely begin to feel the baby move (quickening) between 18 and 20 weeks of the pregnancy. BODY CHANGES Your body goes through many changes during pregnancy. The changes vary from woman to woman.   Your weight will continue to increase. You will notice your lower abdomen bulging out.  You may begin to get stretch marks on your hips, abdomen, and breasts.  You may develop headaches that can be relieved by medicines approved by your caregiver.  You may urinate more often because the fetus is pressing on your bladder.  You may develop or continue to have heartburn as a result of your pregnancy.  You may develop constipation because certain hormones are causing the muscles that push waste through your intestines to slow down.  You may develop hemorrhoids or swollen, bulging veins (varicose veins).  You may have back pain because of the weight gain and pregnancy hormones relaxing your joints between the bones in your pelvis and as a result of a shift in weight and the muscles that support your balance.  Your breasts will continue to grow and be tender.  Your gums may bleed and may be sensitive to brushing and flossing.  Dark spots or blotches (chloasma, mask of pregnancy) may develop on your face. This will likely fade after the baby is born.  A dark line from your belly button to the pubic area (linea nigra) may appear. This will likely fade after the  baby is born. WHAT TO EXPECT AT YOUR PRENATAL VISITS During a routine prenatal visit:  You will be weighed to make sure you and the fetus are growing normally.  Your blood pressure will be taken.  Your abdomen will be measured to track your baby's growth.  The fetal heartbeat will be listened to.  Any test results from the previous visit will be discussed. Your caregiver may ask you:  How you are feeling.  If you are feeling the baby move.  If you have had any abnormal symptoms, such as leaking fluid, bleeding, severe headaches, or abdominal cramping.  If you have any questions. Other tests that may be performed during your second trimester include:  Blood tests that check for:  Low iron levels (anemia).  Gestational diabetes (between 24 and 28 weeks).  Rh antibodies.  Urine tests to check for infections, diabetes, or protein in the urine.  An ultrasound to confirm the proper growth and development of the baby.  An amniocentesis to check for possible genetic problems.  Fetal screens for spina bifida and Down syndrome. HOME CARE INSTRUCTIONS   Avoid all smoking, herbs, alcohol, and unprescribed drugs. These chemicals affect the formation and growth of the baby.  Follow your caregiver's instructions regarding medicine use. There are medicines that are either safe or unsafe to take during pregnancy.  Exercise only as directed by your caregiver. Experiencing uterine cramps is a good sign to stop exercising.  Continue to eat regular,   healthy meals.  Wear a good support bra for breast tenderness.  Do not use hot tubs, steam rooms, or saunas.  Wear your seat belt at all times when driving.  Avoid raw meat, uncooked cheese, cat litter boxes, and soil used by cats. These carry germs that can cause birth defects in the baby.  Take your prenatal vitamins.  Try taking a stool softener (if your caregiver approves) if you develop constipation. Eat more high-fiber foods,  such as fresh vegetables or fruit and whole grains. Drink plenty of fluids to keep your urine clear or pale yellow.  Take warm sitz baths to soothe any pain or discomfort caused by hemorrhoids. Use hemorrhoid cream if your caregiver approves.  If you develop varicose veins, wear support hose. Elevate your feet for 15 minutes, 3 4 times a day. Limit salt in your diet.  Avoid heavy lifting, wear low heel shoes, and practice good posture.  Rest with your legs elevated if you have leg cramps or low back pain.  Visit your dentist if you have not gone yet during your pregnancy. Use a soft toothbrush to brush your teeth and be gentle when you floss.  A sexual relationship may be continued unless your caregiver directs you otherwise.  Continue to go to all your prenatal visits as directed by your caregiver. SEEK MEDICAL CARE IF:   You have dizziness.  You have mild pelvic cramps, pelvic pressure, or nagging pain in the abdominal area.  You have persistent nausea, vomiting, or diarrhea.  You have a bad smelling vaginal discharge.  You have pain with urination. SEEK IMMEDIATE MEDICAL CARE IF:   You have a fever.  You are leaking fluid from your vagina.  You have spotting or bleeding from your vagina.  You have severe abdominal cramping or pain.  You have rapid weight gain or loss.  You have shortness of breath with chest pain.  You notice sudden or extreme swelling of your face, hands, ankles, feet, or legs.  You have not felt your baby move in over an hour.  You have severe headaches that do not go away with medicine.  You have vision changes. Document Released: 01/19/2001 Document Revised: 09/27/2012 Document Reviewed: 03/28/2012 [employer] Patient Information 2014 [employer].  
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
## 2013-04-10 NOTE — Progress Notes (Signed)
U/S(20+2wks)-active fetus, meas c/w dates, FHR-162 bpm, cx appears closed (3.5cm), bilateral adnexa appears wnl with moderate vascularity noted in Rt adnexa +Doppler flow noted within, anterior Gr 0 placenta, prominent adrenal glands noted cephalic to fetal kidneys bilaterally no prominent Doppler flow noted within, no other obvious abnl noted although would like to reck anatomy at 26-28wks, female fetus, no clubbing of LE, NF WNL (4.4185mm), FL and HL meas c/w dates noted

## 2013-04-11 ENCOUNTER — Other Ambulatory Visit: Payer: Medicaid Other

## 2013-04-11 ENCOUNTER — Encounter: Payer: Medicaid Other | Admitting: Women's Health

## 2013-05-08 ENCOUNTER — Encounter: Payer: Medicaid Other | Admitting: Advanced Practice Midwife

## 2013-05-10 ENCOUNTER — Ambulatory Visit (INDEPENDENT_AMBULATORY_CARE_PROVIDER_SITE_OTHER): Payer: Medicaid Other | Admitting: Advanced Practice Midwife

## 2013-05-10 ENCOUNTER — Encounter: Payer: Self-pay | Admitting: Advanced Practice Midwife

## 2013-05-10 VITALS — BP 120/60 | Wt 146.0 lb

## 2013-05-10 DIAGNOSIS — Z331 Pregnant state, incidental: Secondary | ICD-10-CM

## 2013-05-10 DIAGNOSIS — Z8279 Family history of other congenital malformations, deformations and chromosomal abnormalities: Secondary | ICD-10-CM

## 2013-05-10 DIAGNOSIS — O09299 Supervision of pregnancy with other poor reproductive or obstetric history, unspecified trimester: Secondary | ICD-10-CM

## 2013-05-10 DIAGNOSIS — Z1389 Encounter for screening for other disorder: Secondary | ICD-10-CM

## 2013-05-10 LAB — POCT URINALYSIS DIPSTICK
Blood, UA: NEGATIVE
Glucose, UA: NEGATIVE
Ketones, UA: NEGATIVE
Leukocytes, UA: NEGATIVE
Nitrite, UA: NEGATIVE

## 2013-05-10 NOTE — Progress Notes (Signed)
No c/o at this time. Husband thinks her discharg ehas a "twang" to it.  SSE: normal appearing d/c, no odor.  Routine questions about pregnancy answered.  F/U in 3 weeks for Low-risk ob appt PN2.

## 2013-05-10 NOTE — Patient Instructions (Signed)
1. Before your test, do not eat or drink anything for 8-10 hours prior to your  appointment (a small amount of water is allowed and you may take any medicines you normally take). 2. When you arrive, your blood will be drawn for a 'fasting' blood sugar level.  Then you will be given a sweetened carbonated beverage to drink. You should  complete drinking this beverage within five minutes. After finishing the  beverage, you will have your blood drawn exactly 1 and 2 hours later. Having  your blood drawn on time is an important part of this test. A total of three blood  samples will be done. 3. The test takes approximately 2  hours. During the test, do not have anything to  eat or drink. Do not smoke, chew gum (not even sugarless gum) or use breath mints.  4. During the test you should remain close by and seated as much as possible and  avoid walking around. You may want to bring a book or something else to  occupy your time.  5. After your test, you may eat and drink as normal. You may want to bring a snack  to eat after the test is finished. Your provider will advise you as to the results of  this test and any follow-up if necessary  You will also be retested for syphilis, HIV and blood levels (anemia):  You were already tested in the first trimester, but [facility] recommends retesting.  Additionally, you will be tested for Type 2 Herpes. MOST people do not know that they have genital herpes, as only around 15% of people have outbreaks.  However, it is still transmittable to other people, including the baby (but only during the birth).  If you test positive for Type 2 Herpes, we place you on a medicine called acyclovir the last 6 weeks of your pregnancy to prevent transmission of the virus to the baby during the birth.    If your sugar test is positive for gestational diabetes, you will be given an phone call and further instructions discussed.  We typically do not call patients with positive  herpes results, but will discuss it at your next appointment.  If you wish to know all of your test results before your next appointment, feel free to call the office, or look up your test results on Mychart.  (The range that the lab uses for normal values of the sugar test are not necessarily the range that is used for pregnant women; if your results are within the range, they are definitely normal.  However, if a value is deemed "high" by the lab, it may not be too high for a pregnant woman.  We will need to discuss the normal range if your value(s) fall in the "high" category).     

## 2013-05-15 ENCOUNTER — Encounter (HOSPITAL_COMMUNITY): Payer: Self-pay | Admitting: *Deleted

## 2013-05-15 ENCOUNTER — Inpatient Hospital Stay (HOSPITAL_COMMUNITY): Payer: No Typology Code available for payment source

## 2013-05-15 ENCOUNTER — Inpatient Hospital Stay (HOSPITAL_COMMUNITY)
Admission: AD | Admit: 2013-05-15 | Discharge: 2013-05-15 | Disposition: A | Discharge status: Home / Self Care | Payer: No Typology Code available for payment source | Source: Ambulatory Visit | Attending: Family Medicine | Admitting: Family Medicine

## 2013-05-15 DIAGNOSIS — Z3480 Encounter for supervision of other normal pregnancy, unspecified trimester: Secondary | ICD-10-CM

## 2013-05-15 DIAGNOSIS — O99891 Other specified diseases and conditions complicating pregnancy: Secondary | ICD-10-CM | POA: Insufficient documentation

## 2013-05-15 DIAGNOSIS — Z789 Other specified health status: Secondary | ICD-10-CM

## 2013-05-15 DIAGNOSIS — O9A213 Injury, poisoning and certain other consequences of external causes complicating pregnancy, third trimester: Secondary | ICD-10-CM

## 2013-05-15 DIAGNOSIS — O09299 Supervision of pregnancy with other poor reproductive or obstetric history, unspecified trimester: Secondary | ICD-10-CM

## 2013-05-15 DIAGNOSIS — Z283 Underimmunization status: Secondary | ICD-10-CM

## 2013-05-15 DIAGNOSIS — Y92009 Unspecified place in unspecified non-institutional (private) residence as the place of occurrence of the external cause: Secondary | ICD-10-CM | POA: Insufficient documentation

## 2013-05-15 DIAGNOSIS — O09899 Supervision of other high risk pregnancies, unspecified trimester: Secondary | ICD-10-CM

## 2013-05-15 DIAGNOSIS — Z2839 Other underimmunization status: Secondary | ICD-10-CM

## 2013-05-15 DIAGNOSIS — S5010XA Contusion of unspecified forearm, initial encounter: Secondary | ICD-10-CM | POA: Insufficient documentation

## 2013-05-15 DIAGNOSIS — O9989 Other specified diseases and conditions complicating pregnancy, childbirth and the puerperium: Principal | ICD-10-CM
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
## 2013-05-18 NOTE — MAU Provider Note (Signed)
Attestation of Attending Supervision of Advanced Practitioner (PA/CNM/NP): Evaluation and management procedures were performed by the Advanced Practitioner under my supervision and collaboration.  I have reviewed the Advanced Practitioner's note and chart, and I agree with the management and plan.  Jacob Stinson, DO Attending Physician Faculty Practice, [facility]   

## 2013-05-18 NOTE — SANE Note (Signed)
REFERRAL MADE TO [employer] ON Friday, May 18, 2013, VIA EMAIL.

## 2013-05-29 ENCOUNTER — Other Ambulatory Visit: Payer: Self-pay | Admitting: Obstetrics & Gynecology

## 2013-05-29 DIAGNOSIS — O358XX0 Maternal care for other (suspected) fetal abnormality and damage, not applicable or unspecified: Secondary | ICD-10-CM

## 2013-05-31 ENCOUNTER — Ambulatory Visit (INDEPENDENT_AMBULATORY_CARE_PROVIDER_SITE_OTHER): Payer: Medicaid Other | Admitting: Obstetrics & Gynecology

## 2013-05-31 ENCOUNTER — Ambulatory Visit (INDEPENDENT_AMBULATORY_CARE_PROVIDER_SITE_OTHER): Payer: Medicaid Other

## 2013-05-31 ENCOUNTER — Encounter: Payer: Self-pay | Admitting: Obstetrics & Gynecology

## 2013-05-31 ENCOUNTER — Other Ambulatory Visit: Payer: Medicaid Other

## 2013-05-31 VITALS — BP 100/62 | Wt 145.0 lb

## 2013-05-31 DIAGNOSIS — Z3480 Encounter for supervision of other normal pregnancy, unspecified trimester: Secondary | ICD-10-CM

## 2013-05-31 DIAGNOSIS — Z1389 Encounter for screening for other disorder: Secondary | ICD-10-CM

## 2013-05-31 DIAGNOSIS — Z331 Pregnant state, incidental: Secondary | ICD-10-CM

## 2013-05-31 DIAGNOSIS — O09299 Supervision of pregnancy with other poor reproductive or obstetric history, unspecified trimester: Secondary | ICD-10-CM

## 2013-05-31 DIAGNOSIS — Z8279 Family history of other congenital malformations, deformations and chromosomal abnormalities: Secondary | ICD-10-CM

## 2013-05-31 DIAGNOSIS — O358XX0 Maternal care for other (suspected) fetal abnormality and damage, not applicable or unspecified: Secondary | ICD-10-CM

## 2013-05-31 LAB — CBC
HEMATOCRIT: 39.2 % (ref 36.0–46.0)
Hemoglobin: 13.7 g/dL (ref 12.0–15.0)
MCH: 28.9 pg (ref 26.0–34.0)
MCHC: 34.9 g/dL (ref 30.0–36.0)
MCV: 82.7 fL (ref 78.0–100.0)
Platelets: 101 10*3/uL — ABNORMAL LOW (ref 150–400)
RBC: 4.74 MIL/uL (ref 3.87–5.11)
RDW: 16 % — AB (ref 11.5–15.5)
WBC: 6.8 10*3/uL (ref 4.0–10.5)

## 2013-05-31 LAB — POCT URINALYSIS DIPSTICK
Glucose, UA: NEGATIVE
Ketones, UA: NEGATIVE
Leukocytes, UA: NEGATIVE
Nitrite, UA: NEGATIVE
Protein, UA: NEGATIVE
RBC UA: NEGATIVE

## 2013-05-31 NOTE — Progress Notes (Signed)
Sonogram is reviewed and report done.  Adrenal glands are no longer disproportionate.  PN2 today  BP weight and urine results all reviewed and noted. Patient reports good fetal movement, denies any bleeding and no rupture of membranes symptoms or regular contractions. Patient is without complaints. All questions were answered.

## 2013-05-31 NOTE — Progress Notes (Signed)
U/S(27+4wks)-active fetus, breech presentation, FHR-143 bpm, fluid wnl AFI-12.2cm SDP-4.5cm, female fetus, anterior Gr 2 placenta with multiple lakes noted with flow noted within, again bilateral fetal adrenal glands noted

## 2013-06-01 LAB — RPR

## 2013-06-01 LAB — HIV ANTIBODY (ROUTINE TESTING W REFLEX): HIV: NONREACTIVE

## 2013-06-01 LAB — GLUCOSE TOLERANCE, 2 HOURS W/ 1HR
GLUCOSE, FASTING: 63 mg/dL — AB (ref 70–99)
GLUCOSE: 77 mg/dL (ref 70–170)
Glucose, 2 hour: 58 mg/dL — ABNORMAL LOW (ref 70–139)

## 2013-06-01 LAB — ANTIBODY SCREEN: Antibody Screen: NEGATIVE

## 2013-06-01 LAB — HSV 2 ANTIBODY, IGG: HSV 2 Glycoprotein G Ab, IgG: 0.53 IV

## 2013-06-03 ENCOUNTER — Encounter: Payer: Self-pay | Admitting: Obstetrics & Gynecology

## 2013-06-21 ENCOUNTER — Ambulatory Visit (INDEPENDENT_AMBULATORY_CARE_PROVIDER_SITE_OTHER): Payer: Medicaid Other | Admitting: Advanced Practice Midwife

## 2013-06-21 ENCOUNTER — Encounter: Payer: Self-pay | Admitting: Advanced Practice Midwife

## 2013-06-21 VITALS — BP 100/52 | Wt 150.0 lb

## 2013-06-21 DIAGNOSIS — Z331 Pregnant state, incidental: Secondary | ICD-10-CM

## 2013-06-21 DIAGNOSIS — Z3480 Encounter for supervision of other normal pregnancy, unspecified trimester: Secondary | ICD-10-CM

## 2013-06-21 DIAGNOSIS — Z1389 Encounter for screening for other disorder: Secondary | ICD-10-CM

## 2013-06-21 LAB — POCT URINALYSIS DIPSTICK
Blood, UA: NEGATIVE
Glucose, UA: NEGATIVE
KETONES UA: NEGATIVE
Leukocytes, UA: NEGATIVE
Nitrite, UA: NEGATIVE
Protein, UA: NEGATIVE

## 2013-06-21 NOTE — Progress Notes (Signed)
Passed gtt.  C/O heartburn.  Hasn't tried anything yet.  Tums/rolaids.  Routine questions about pregnancy answered.  F/U in 2 weeks for Low-risk ob appt

## 2013-07-07 ENCOUNTER — Encounter: Payer: Self-pay | Admitting: Women's Health

## 2013-07-07 DIAGNOSIS — O9A319 Physical abuse complicating pregnancy, unspecified trimester: Secondary | ICD-10-CM | POA: Insufficient documentation

## 2013-07-09 ENCOUNTER — Ambulatory Visit (INDEPENDENT_AMBULATORY_CARE_PROVIDER_SITE_OTHER): Payer: Medicaid Other | Admitting: Women's Health

## 2013-07-09 ENCOUNTER — Encounter: Payer: Self-pay | Admitting: Women's Health

## 2013-07-09 VITALS — BP 100/58 | Wt 152.0 lb

## 2013-07-09 DIAGNOSIS — Z1389 Encounter for screening for other disorder: Secondary | ICD-10-CM

## 2013-07-09 DIAGNOSIS — Z3480 Encounter for supervision of other normal pregnancy, unspecified trimester: Secondary | ICD-10-CM

## 2013-07-09 DIAGNOSIS — Z331 Pregnant state, incidental: Secondary | ICD-10-CM

## 2013-07-09 LAB — POCT URINALYSIS DIPSTICK
Glucose, UA: NEGATIVE
KETONES UA: NEGATIVE
Leukocytes, UA: NEGATIVE
Nitrite, UA: NEGATIVE
PROTEIN UA: NEGATIVE
RBC UA: NEGATIVE
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
## 2013-07-09 NOTE — Patient Instructions (Addendum)
Constipation  Drink plenty of fluid, preferably water, throughout the day  Eat foods high in fiber such as fruits, vegetables, and grains  Exercise, such as walking, is a good way to keep your bowels regular  Drink warm fluids, especially warm prune juice, or decaf coffee  Eat a 1/2 cup of real oatmeal (not instant), 1/2 cup applesauce, and 1/2-1 cup warm prune juice every day  If needed, you may take Colace (docusate sodium) stool softener once or twice a day to help keep the stool soft. If you are pregnant, wait until you are out of your first trimester (12-14 weeks of pregnancy)  If you still are having problems with constipation, you may take Miralax once daily as needed to help keep your bowels regular.  If you are pregnant, wait until you are out of your first trimester (12-14 weeks of pregnancy)    Third Trimester of Pregnancy The third trimester is from week 29 through week 42, months 7 through 9. The third trimester is a time when the fetus is growing rapidly. At the end of the ninth month, the fetus is about 20 inches in length and weighs 6 10 pounds.  BODY CHANGES Your body goes through many changes during pregnancy. The changes vary from woman to woman.   Your weight will continue to increase. You can expect to gain 25 35 pounds (11 16 kg) by the end of the pregnancy.  You may begin to get stretch marks on your hips, abdomen, and breasts.  You may urinate more often because the fetus is moving lower into your pelvis and pressing on your bladder.  You may develop or continue to have heartburn as a result of your pregnancy.  You may develop constipation because certain hormones are causing the muscles that push waste through your intestines to slow down.  You may develop hemorrhoids or swollen, bulging veins (varicose veins).  You may have pelvic pain because of the weight gain and pregnancy hormones relaxing your joints between the bones in your pelvis. Back aches may  result from over exertion of the muscles supporting your posture.  Your breasts will continue to grow and be tender. A yellow discharge may leak from your breasts called colostrum.  Your belly button may stick out.  You may feel short of breath because of your expanding uterus.  You may notice the fetus "dropping," or moving lower in your abdomen.  You may have a bloody mucus discharge. This usually occurs a few days to a week before labor begins.  Your cervix becomes thin and soft (effaced) near your due date. WHAT TO EXPECT AT YOUR PRENATAL EXAMS  You will have prenatal exams every 2 weeks until week 36. Then, you will have weekly prenatal exams. During a routine prenatal visit:  You will be weighed to make sure you and the fetus are growing normally.  Your blood pressure is taken.  Your abdomen will be measured to track your baby's growth.  The fetal heartbeat will be listened to.  Any test results from the previous visit will be discussed.  You may have a cervical check near your due date to see if you have effaced. At around 36 weeks, your caregiver will check your cervix. At the same time, your caregiver will also perform a test on the secretions of the vaginal tissue. This test is to determine if a type of bacteria, Group B streptococcus, is present. Your caregiver will explain this further. Your caregiver may ask you:  What your birth plan is.  How you are feeling.  If you are feeling the baby move.  If you have had any abnormal symptoms, such as leaking fluid, bleeding, severe headaches, or abdominal cramping.  If you have any questions. Other tests or screenings that may be performed during your third trimester include:  Blood tests that check for low iron levels (anemia).  Fetal testing to check the health, activity level, and growth of the fetus. Testing is done if you have certain medical conditions or if there are problems during the pregnancy. FALSE  LABOR You may feel small, irregular contractions that eventually go away. These are called Braxton Hicks contractions, or false labor. Contractions may last for hours, days, or even weeks before true labor sets in. If contractions come at regular intervals, intensify, or become painful, it is best to be seen by your caregiver.  SIGNS OF LABOR   Menstrual-like cramps.  Contractions that are 5 minutes apart or less.  Contractions that start on the top of the uterus and spread down to the lower abdomen and back.  A sense of increased pelvic pressure or back pain.  A watery or bloody mucus discharge that comes from the vagina. If you have any of these signs before the 37th week of pregnancy, call your caregiver right away. You need to go to the hospital to get checked immediately. HOME CARE INSTRUCTIONS   Avoid all smoking, herbs, alcohol, and unprescribed drugs. These chemicals affect the formation and growth of the baby.  Follow your caregiver's instructions regarding medicine use. There are medicines that are either safe or unsafe to take during pregnancy.  Exercise only as directed by your caregiver. Experiencing uterine cramps is a good sign to stop exercising.  Continue to eat regular, healthy meals.  Wear a good support bra for breast tenderness.  Do not use hot tubs, steam rooms, or saunas.  Wear your seat belt at all times when driving.  Avoid raw meat, uncooked cheese, cat litter boxes, and soil used by cats. These carry germs that can cause birth defects in the baby.  Take your prenatal vitamins.  Try taking a stool softener (if your caregiver approves) if you develop constipation. Eat more high-fiber foods, such as fresh vegetables or fruit and whole grains. Drink plenty of fluids to keep your urine clear or pale yellow.  Take warm sitz baths to soothe any pain or discomfort caused by hemorrhoids. Use hemorrhoid cream if your caregiver approves.  If you develop varicose  veins, wear support hose. Elevate your feet for 15 minutes, 3 4 times a day. Limit salt in your diet.  Avoid heavy lifting, wear low heal shoes, and practice good posture.  Rest a lot with your legs elevated if you have leg cramps or low back pain.  Visit your dentist if you have not gone during your pregnancy. Use a soft toothbrush to brush your teeth and be gentle when you floss.  A sexual relationship may be continued unless your caregiver directs you otherwise.  Do not travel far distances unless it is absolutely necessary and only with the approval of your caregiver.  Take prenatal classes to understand, practice, and ask questions about the labor and delivery.  Make a trial run to the hospital.  Pack your hospital bag.  Prepare the baby's nursery.  Continue to go to all your prenatal visits as directed by your caregiver. SEEK MEDICAL CARE IF:  You are unsure if you are in labor or  if your water has broken.  You have dizziness.  You have mild pelvic cramps, pelvic pressure, or nagging pain in your abdominal area.  You have persistent nausea, vomiting, or diarrhea.  You have a bad smelling vaginal discharge.  You have pain with urination. SEEK IMMEDIATE MEDICAL CARE IF:   You have a fever.  You are leaking fluid from your vagina.  You have spotting or bleeding from your vagina.  You have severe abdominal cramping or pain.  You have rapid weight loss or gain.  You have shortness of breath with chest pain.  You notice sudden or extreme swelling of your face, hands, ankles, feet, or legs.  You have not felt your baby move in over an hour.  You have severe headaches that do not go away with medicine.  You have vision changes. Document Released: 01/19/2001 Document Revised: 09/27/2012 Document Reviewed: 03/28/2012 [facility] Patient Information 2014 [place], [place].

## 2013-07-24 ENCOUNTER — Encounter: Payer: Medicaid Other | Admitting: Advanced Practice Midwife

## 2013-07-25 ENCOUNTER — Inpatient Hospital Stay (HOSPITAL_COMMUNITY)
Admission: AD | Admit: 2013-07-25 | Discharge: 2013-07-25 | Disposition: A | Discharge status: Home / Self Care | Payer: Medicaid Other | Source: Ambulatory Visit | Attending: Obstetrics & Gynecology | Admitting: Obstetrics & Gynecology

## 2013-07-25 ENCOUNTER — Encounter (HOSPITAL_COMMUNITY): Payer: Self-pay

## 2013-07-25 DIAGNOSIS — R109 Unspecified abdominal pain: Secondary | ICD-10-CM | POA: Insufficient documentation

## 2013-07-25 DIAGNOSIS — Z789 Other specified health status: Secondary | ICD-10-CM

## 2013-07-25 DIAGNOSIS — Z3480 Encounter for supervision of other normal pregnancy, unspecified trimester: Secondary | ICD-10-CM

## 2013-07-25 DIAGNOSIS — O212 Late vomiting of pregnancy: Secondary | ICD-10-CM | POA: Insufficient documentation

## 2013-07-25 DIAGNOSIS — O219 Vomiting of pregnancy, unspecified: Secondary | ICD-10-CM | POA: Diagnosis not present

## 2013-07-25 DIAGNOSIS — Z2839 Other underimmunization status: Secondary | ICD-10-CM

## 2013-07-25 DIAGNOSIS — Z283 Underimmunization status: Secondary | ICD-10-CM

## 2013-07-25 DIAGNOSIS — O4700 False labor before 37 completed weeks of gestation, unspecified trimester: Secondary | ICD-10-CM | POA: Insufficient documentation

## 2013-07-25 DIAGNOSIS — O09899 Supervision of other high risk pregnancies, unspecified trimester: Secondary | ICD-10-CM

## 2013-07-25 DIAGNOSIS — O09299 Supervision of pregnancy with other poor reproductive or obstetric history, unspecified trimester: Secondary | ICD-10-CM

## 2013-07-25 HISTORY — DX: Thrombocytopenia, unspecified: D69.6

## 2013-07-25 HISTORY — DX: Other diseases of the blood and blood-forming organs and certain disorders involving the immune mechanism complicating pregnancy, unspecified trimester: O99.119

## 2013-07-25 HISTORY — DX: Other diseases of the blood and blood-forming organs and certain disorders involving the immune mechanism complicating pregnancy, unspecified trimester: D69.6

## 2013-07-25 LAB — URINALYSIS, ROUTINE W REFLEX MICROSCOPIC
GLUCOSE, UA: NEGATIVE mg/dL
HGB URINE DIPSTICK: NEGATIVE
NITRITE: NEGATIVE
PH: 6.5 (ref 5.0–8.0)
Protein, ur: 30 mg/dL — AB
SPECIFIC GRAVITY, URINE: 1.01 (ref 1.005–1.030)
Urobilinogen, UA: 4 mg/dL — ABNORMAL HIGH (ref 0.0–1.0)

## 2013-07-25 LAB — URINE MICROSCOPIC-ADD ON
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
## 2013-07-25 NOTE — MAU Note (Signed)
Abdominal pain, feels like contractions, 5x per hours since 630am yesterday. Denies VB/LOF/urinary complaints. Positive fetal movement. Vomited x 1 at 230am this morning. Denies nausea.

## 2013-07-25 NOTE — MAU Provider Note (Signed)
None     Chief Complaint:  Abdominal Pain and Emesis During Pregnancy   Kimberly Ramsey is  28 y.o. G3P2002 at 3816w3d presents complaining of Abdominal Pain and Emesis During Pregnancy .  She states irregular, every 15 minutes contractions since yesterday morning associated with no vaginal bleeding, intact membranes, along with active fetal movement. Pt had emesis x1. NO nausea. No other complaints  Obstetrical/Gynecological History: OB History   Grav Para Term Preterm Abortions TAB SAB Ect Mult Living   3 2 2       2      Past Medical History: Past Medical History  Diagnosis Date  . Cleft lip and palate   . Thrombocytopenia complicating pregnancy     Past Surgical History: Past Surgical History  Procedure Laterality Date  . Cleft palate repair    . Hip surgery      took bone from hip to fix cleft palate.    Family History: Family History  Problem Relation Age of Onset  . Mental illness Mother   . Schizophrenia Mother   . Lupus Father     Social History: History  Substance Use Topics  . Smoking status: Never Smoker   . Smokeless tobacco: Never Used  . Alcohol Use: No    Allergies:  Allergies  Allergen Reactions  . Codeine Nausea And Vomiting    Meds:  Prescriptions prior to admission  Medication Sig Dispense Refill  . Prenatal Vit-Fe Fumarate-FA (PRENATAL MULTIVITAMIN) TABS tablet Take 1 tablet by mouth daily at 12 noon.        Review of Systems -   Review of Systems  no complaints other than ctx  Physical Exam  Blood pressure 82/47, pulse 114, temperature 98.2 F (36.8 C), temperature source Oral, resp. rate 18, height 5\' 5"  (1.651 m), weight 68.584 kg (151 lb 3.2 oz), last menstrual period 11/13/2012, SpO2 99.00%. GENERAL: Well-developed, well-nourished female in no acute distress.  ABDOMEN: Soft, nontender, nondistended, gravid.  EXTREMITIES: Nontender, no edema, 2+ distal pulses.   Presentation: cephalic FHT:  Baseline rate 150 bpm    Variability moderate  Accelerations present   Decelerations none Contractions: None seen   Labs: Results for orders placed during the hospital encounter of 07/25/13 (from the past 24 hour(s))  URINALYSIS, ROUTINE W REFLEX MICROSCOPIC   Collection Time    07/25/13  3:32 AM      Result Value Ref Range   Color, Urine YELLOW  YELLOW   APPearance CLEAR  CLEAR   Specific Gravity, Urine 1.010  1.005 - 1.030   pH 6.5  5.0 - 8.0   Glucose, UA NEGATIVE  NEGATIVE mg/dL   Hgb urine dipstick NEGATIVE  NEGATIVE   Bilirubin Urine SMALL (*) NEGATIVE   Ketones, ur >80 (*) NEGATIVE mg/dL   Protein, ur 30 (*) NEGATIVE mg/dL   Urobilinogen, UA 4.0 (*) 0.0 - 1.0 mg/dL   Nitrite NEGATIVE  NEGATIVE   Leukocytes, UA SMALL (*) NEGATIVE  URINE MICROSCOPIC-ADD ON   Collection Time    07/25/13  3:32 AM      Result Value Ref Range   Squamous Epithelial / LPF FEW (*) RARE   WBC, UA 7-10  <3 WBC/hpf   RBC / HPF 0-2  <3 RBC/hpf   Bacteria, UA FEW (*) RARE   Urine-Other AMORPHOUS URATES/PHOSPHATES     Imaging Studies:  No results found.  Assessment: Kimberly Ramsey is  28 y.o. G3P2002 at 11016w3d presents with likely contractions given hx of and presentation. Otherwise  stable and far apart. Pt given option to stay for extended monitoring vs go home and return for worsening symptoms of labor. Pt elected to go home. Reassuring fetus. PTL precautions reviewed. [facility]  ODOM, MICHAEL RYAN 6/17/20154:19 AM

## 2013-07-26 ENCOUNTER — Encounter: Payer: Self-pay | Admitting: Advanced Practice Midwife

## 2013-07-26 ENCOUNTER — Ambulatory Visit (INDEPENDENT_AMBULATORY_CARE_PROVIDER_SITE_OTHER): Payer: Medicaid Other | Admitting: Advanced Practice Midwife

## 2013-07-26 ENCOUNTER — Telehealth: Payer: Self-pay | Admitting: *Deleted

## 2013-07-26 VITALS — BP 100/60 | Wt 147.0 lb

## 2013-07-26 DIAGNOSIS — Z331 Pregnant state, incidental: Secondary | ICD-10-CM

## 2013-07-26 DIAGNOSIS — Z1389 Encounter for screening for other disorder: Secondary | ICD-10-CM

## 2013-07-26 DIAGNOSIS — Z3480 Encounter for supervision of other normal pregnancy, unspecified trimester: Secondary | ICD-10-CM

## 2013-07-26 DIAGNOSIS — Z3483 Encounter for supervision of other normal pregnancy, third trimester: Secondary | ICD-10-CM

## 2013-07-26 MED ORDER — ONDANSETRON 4 MG PO TBDP: 4.0000 mg | ORAL_TABLET | Freq: Four times a day (QID) | ORAL | Status: DC | PRN

## 2013-07-26 NOTE — Progress Notes (Signed)
Pt unable to void, Pt states that she has been having contractions every 12 minutes since Tuesday at work. Pt states that the contractions will stop for a little while but then come right back. Pt states that she has been having nausea, vomiting and diarrhea that started last night. Pt denies any gush of fluid.

## 2013-07-26 NOTE — Telephone Encounter (Signed)
Pt states missed her appt this past Tuesday, now having multiple complaints, contractions, dizziness, diarrhea, N/V. Pt told to be here today at 1:30 to be seen by provider. Encouraged pt to have someone drive her to her appt due to dizzy spells.

## 2013-07-26 NOTE — Progress Notes (Signed)
[id number] 5313w4d Estimated Date of Delivery: 08/26/13  Blood pressure 100/60, weight 147 lb (66.679 kg), last menstrual period 11/13/2012.   BP weight and urine results all reviewed and noted.  Please refer to the obstetrical flow sheet for the fundal height and fetal heart rate documentation:  Patient reports good fetal movement, denies any bleeding and no rupture of membranes symptoms or regular contractions. Patient has been having off and on contractions since Tuesday night,  [facility]  Having vomiting and diarrhea.  All questions were answered.  Plan:  Continued routine obstetrical care, Zofran and immodium  Follow up in 1 weeks for OB appointment, GBS/GC/CHL

## 2013-07-27 NOTE — MAU Provider Note (Signed)
Attestation of Attending Supervision of Obstetric Fellow: Evaluation and management procedures were performed by the Obstetric Fellow under my supervision and collaboration.  I have reviewed the Obstetric Fellow's note and chart, and I agree with the management and plan.  UGONNA  ANYANWU, MD, FACOG Attending Obstetrician & Gynecologist Faculty Practice, [facility]   

## 2013-07-31 ENCOUNTER — Ambulatory Visit (INDEPENDENT_AMBULATORY_CARE_PROVIDER_SITE_OTHER): Payer: Medicaid Other | Admitting: Women's Health

## 2013-07-31 VITALS — BP 110/60 | Wt 152.0 lb

## 2013-07-31 DIAGNOSIS — D696 Thrombocytopenia, unspecified: Secondary | ICD-10-CM

## 2013-07-31 DIAGNOSIS — Z3483 Encounter for supervision of other normal pregnancy, third trimester: Secondary | ICD-10-CM

## 2013-07-31 DIAGNOSIS — Z1389 Encounter for screening for other disorder: Secondary | ICD-10-CM

## 2013-07-31 DIAGNOSIS — Z331 Pregnant state, incidental: Secondary | ICD-10-CM

## 2013-07-31 DIAGNOSIS — D689 Coagulation defect, unspecified: Secondary | ICD-10-CM

## 2013-07-31 DIAGNOSIS — O99119 Other diseases of the blood and blood-forming organs and certain disorders involving the immune mechanism complicating pregnancy, unspecified trimester: Secondary | ICD-10-CM

## 2013-07-31 DIAGNOSIS — Z3480 Encounter for supervision of other normal pregnancy, unspecified trimester: Secondary | ICD-10-CM

## 2013-07-31 LAB — POCT URINALYSIS DIPSTICK
Glucose, UA: NEGATIVE
Ketones, UA: NEGATIVE
Leukocytes, UA: NEGATIVE
NITRITE UA: NEGATIVE
RBC UA: NEGATIVE

## 2013-07-31 MED ORDER — HYDROCORTISONE ACE-PRAMOXINE 1-1 % RE FOAM: 1.0000 | Freq: Two times a day (BID) | RECTAL | Status: DC | PRN

## 2013-07-31 NOTE — Addendum Note (Signed)
Addended by: Cheral MarkerBOOKER, KIMBERLY R on: 07/31/2013 05:16 PM   Modules accepted: Orders

## 2013-07-31 NOTE — Patient Instructions (Signed)
Preterm Labor Information Preterm labor is when labor starts at less than 37 weeks of pregnancy. The normal length of a pregnancy is 39 to 41 weeks. CAUSES Often, there is no identifiable underlying cause as to why a woman goes into preterm labor. One of the most common known causes of preterm labor is infection. Infections of the uterus, cervix, vagina, amniotic sac, bladder, kidney, or even the lungs (pneumonia) can cause labor to start. Other suspected causes of preterm labor include:   Urogenital infections, such as yeast infections and bacterial vaginosis.   Uterine abnormalities (uterine shape, uterine septum, fibroids, or bleeding from the placenta).   A cervix that has been operated on (it may fail to stay closed).   Malformations in the fetus.   Multiple gestations (twins, triplets, and so on).   Breakage of the amniotic sac.  RISK FACTORS  Having a previous history of preterm labor.   Having premature rupture of membranes (PROM).   Having a placenta that covers the opening of the cervix (placenta previa).   Having a placenta that separates from the uterus (placental abruption).   Having a cervix that is too weak to hold the fetus in the uterus (incompetent cervix).   Having too much fluid in the amniotic sac (polyhydramnios).   Taking illegal drugs or smoking while pregnant.   Not gaining enough weight while pregnant.   Being younger than 21 and older than 26 years old.   Having a low socioeconomic status.   Being African American. SYMPTOMS Signs and symptoms of preterm labor include:   Menstrual-like cramps, abdominal pain, or back pain.  Uterine contractions that are regular, as frequent as six in an hour, regardless of their intensity (may be mild or painful).  Contractions that start on the top of the uterus and spread down to the lower abdomen and back.   A sense of increased pelvic pressure.   A watery or bloody mucus discharge that  comes from the vagina.  TREATMENT Depending on the length of the pregnancy and other circumstances, your health care provider may suggest bed rest. If necessary, there are medicines that can be given to stop contractions and to mature the fetal lungs. If labor happens before 34 weeks of pregnancy, a prolonged hospital stay may be recommended. Treatment depends on the condition of both you and the fetus.  WHAT SHOULD YOU DO IF YOU THINK YOU ARE IN PRETERM LABOR? Call your health care provider right away. You will need to go to the hospital to get checked immediately. HOW CAN YOU PREVENT PRETERM LABOR IN FUTURE PREGNANCIES? You should:   Stop smoking if you smoke.  Maintain healthy weight gain and avoid chemicals and drugs that are not necessary.  Be watchful for any type of infection.  Inform your health care provider if you have a known history of preterm labor. Document Released: 04/17/2003 Document Revised: 09/27/2012 Document Reviewed: 02/28/2012 [facility] Patient Information 2015 [place], [place]. This information is not intended to replace advice given to you by your health care provider. Make sure you discuss any questions you have with your health care provider.  Levonorgestrel intrauterine device (IUD)-Mirena What is this medicine? LEVONORGESTREL IUD (LEE voe nor jes trel) is a contraceptive (birth control) device. The device is placed inside the uterus by a healthcare professional. It is used to prevent pregnancy and can also be used to treat heavy bleeding that occurs during your period. Depending on the device, it can be used for 3 to 5  years. This medicine may be used for other purposes; ask your health care provider or pharmacist if you have questions. COMMON BRAND NAME(S): Gretta CoolMirena, Skyla What should I tell my health care provider before I take this medicine? They need to know if you have any of these conditions: -abnormal Pap smear -cancer of the breast, uterus, or  cervix -diabetes -endometritis -genital or pelvic infection now or in the past -have more than one sexual partner or your partner has more than one partner -heart disease -history of an ectopic or tubal pregnancy -immune system problems -IUD in place -liver disease or tumor -problems with blood clots or take blood-thinners -use intravenous drugs -uterus of unusual shape -vaginal bleeding that has not been explained -an unusual or allergic reaction to levonorgestrel, other hormones, silicone, or polyethylene, medicines, foods, dyes, or preservatives -pregnant or trying to get pregnant -breast-feeding How should I use this medicine? This device is placed inside the uterus by a health care professional. Talk to your pediatrician regarding the use of this medicine in children. Special care may be needed. Overdosage: If you think you have taken too much of this medicine contact a poison control center or emergency room at once. NOTE: This medicine is only for you. Do not share this medicine with others. What if I miss a dose? This does not apply. What may interact with this medicine? Do not take this medicine with any of the following medications: -amprenavir -bosentan -fosamprenavir This medicine may also interact with the following medications: -aprepitant -barbiturate medicines for inducing sleep or treating seizures -bexarotene -griseofulvin -medicines to treat seizures like carbamazepine, ethotoin, felbamate, oxcarbazepine, phenytoin, topiramate -modafinil -pioglitazone -rifabutin -rifampin -rifapentine -some medicines to treat HIV infection like atazanavir, indinavir, lopinavir, nelfinavir, tipranavir, ritonavir -St. John's wort -warfarin This list may not describe all possible interactions. Give your health care provider a list of all the medicines, herbs, non-prescription drugs, or dietary supplements you use. Also tell them if you smoke, drink alcohol, or use illegal  drugs. Some items may interact with your medicine. What should I watch for while using this medicine? Visit your doctor or health care professional for regular check ups. See your doctor if you or your partner has sexual contact with others, becomes HIV positive, or gets a sexual transmitted disease. This product does not protect you against HIV infection (AIDS) or other sexually transmitted diseases. You can check the placement of the IUD yourself by reaching up to the top of your vagina with clean fingers to feel the threads. Do not pull on the threads. It is a good habit to check placement after each menstrual period. Call your doctor right away if you feel more of the IUD than just the threads or if you cannot feel the threads at all. The IUD may come out by itself. You may become pregnant if the device comes out. If you notice that the IUD has come out use a backup birth control method like condoms and call your health care provider. Using tampons will not change the position of the IUD and are okay to use during your period. What side effects may I notice from receiving this medicine? Side effects that you should report to your doctor or health care professional as soon as possible: -allergic reactions like skin rash, itching or hives, swelling of the face, lips, or tongue -fever, flu-like symptoms -genital sores -high blood pressure -no menstrual period for 6 weeks during use -pain, swelling, warmth in the leg -pelvic pain or tenderness -  severe or sudden headache -signs of pregnancy -stomach cramping -sudden shortness of breath -trouble with balance, talking, or walking -unusual vaginal bleeding, discharge -yellowing of the eyes or skin Side effects that usually do not require medical attention (report to your doctor or health care professional if they continue or are bothersome): -acne -breast pain -change in sex drive or performance -changes in weight -cramping, dizziness, or  faintness while the device is being inserted -headache -irregular menstrual bleeding within first 3 to 6 months of use -nausea This list may not describe all possible side effects. Call your doctor for medical advice about side effects. You may report side effects to FDA at 1-800-FDA-1088. Where should I keep my medicine? This does not apply. NOTE: This sheet is a summary. It may not cover all possible information. If you have questions about this medicine, talk to your doctor, pharmacist, or health care provider.  2015, Elsevier/Gold Standard. (2011-02-25 13:54:04)  Etonogestrel implant- Nexplanon What is this medicine? ETONOGESTREL (et oh noe JES trel) is a contraceptive (birth control) device. It is used to prevent pregnancy. It can be used for up to 3 years. This medicine may be used for other purposes; ask your health care provider or pharmacist if you have questions. COMMON BRAND NAME(S): Implanon, Nexplanon What should I tell my health care provider before I take this medicine? They need to know if you have any of these conditions: -abnormal vaginal bleeding -blood vessel disease or blood clots -cancer of the breast, cervix, or liver -depression -diabetes -gallbladder disease -headaches -heart disease or recent heart attack -high blood pressure -high cholesterol -kidney disease -liver disease -renal disease -seizures -tobacco smoker -an unusual or allergic reaction to etonogestrel, other hormones, anesthetics or antiseptics, medicines, foods, dyes, or preservatives -pregnant or trying to get pregnant -breast-feeding How should I use this medicine? This device is inserted just under the skin on the inner side of your upper arm by a health care professional. Talk to your pediatrician regarding the use of this medicine in children. Special care may be needed. Overdosage: If you think you've taken too much of this medicine contact a poison control center or emergency room at  once. Overdosage: If you think you have taken too much of this medicine contact a poison control center or emergency room at once. NOTE: This medicine is only for you. Do not share this medicine with others. What if I miss a dose? This does not apply. What may interact with this medicine? Do not take this medicine with any of the following medications: -amprenavir -bosentan -fosamprenavir This medicine may also interact with the following medications: -barbiturate medicines for inducing sleep or treating seizures -certain medicines for fungal infections like ketoconazole and itraconazole -griseofulvin -medicines to treat seizures like carbamazepine, felbamate, oxcarbazepine, phenytoin, topiramate -modafinil -phenylbutazone -rifampin -some medicines to treat HIV infection like atazanavir, indinavir, lopinavir, nelfinavir, tipranavir, ritonavir -St. John's wort This list may not describe all possible interactions. Give your health care provider a list of all the medicines, herbs, non-prescription drugs, or dietary supplements you use. Also tell them if you smoke, drink alcohol, or use illegal drugs. Some items may interact with your medicine. What should I watch for while using this medicine? This product does not protect you against HIV infection (AIDS) or other sexually transmitted diseases. You should be able to feel the implant by pressing your fingertips over the skin where it was inserted. Tell your doctor if you cannot feel the implant. What side effects may I notice  from receiving this medicine? Side effects that you should report to your doctor or health care professional as soon as possible: -allergic reactions like skin rash, itching or hives, swelling of the face, lips, or tongue -breast lumps -changes in vision -confusion, trouble speaking or understanding -dark urine -depressed mood -general ill feeling or flu-like symptoms -light-colored stools -loss of appetite,  nausea -right upper belly pain -severe headaches -severe pain, swelling, or tenderness in the abdomen -shortness of breath, chest pain, swelling in a leg -signs of pregnancy -sudden numbness or weakness of the face, arm or leg -trouble walking, dizziness, loss of balance or coordination -unusual vaginal bleeding, discharge -unusually weak or tired -yellowing of the eyes or skin Side effects that usually do not require medical attention (Report these to your doctor or health care professional if they continue or are bothersome.): -acne -breast pain -changes in weight -cough -fever or chills -headache -irregular menstrual bleeding -itching, burning, and vaginal discharge -pain or difficulty passing urine -sore throat This list may not describe all possible side effects. Call your doctor for medical advice about side effects. You may report side effects to FDA at 1-800-FDA-1088. Where should I keep my medicine? This drug is given in a hospital or clinic and will not be stored at home. NOTE: This sheet is a summary. It may not cover all possible information. If you have questions about this medicine, talk to your doctor, pharmacist, or health care provider.  2015, Elsevier/Gold Standard. (2011-08-02 15:37:45)

## 2013-07-31 NOTE — Progress Notes (Addendum)
Low-risk OB appointment [id number] 2860w2d Estimated Date of Delivery: 08/26/13 Blood pressure 110/60, weight 152 lb (68.947 kg), last menstrual period 11/13/2012.  BP, weight, and urine reviewed.  Refer to obstetrical flow sheet for FH & FHR.  Reports good fm.  Denies regular uc's, lof, vb, or uti s/s. N/V/D lasted about 5d, much better now! Wasn't able to make it to waterbirth class d/t illness, so no longer planning on waterbirth as next class is right before her EDB. Reports tender hemorrhoid. Rx proctofoam.  Mirena vs. Nexplanon, discussed, info given Reviewed ptl s/s, fkc. Plan:  Continue routine obstetrical care, check platelets today- was 101 on 4/23 F/U in 1wk for OB appointment, gbs

## 2013-08-01 ENCOUNTER — Telehealth: Payer: Self-pay | Admitting: Obstetrics and Gynecology

## 2013-08-01 LAB — CBC
HCT: 40.9 % (ref 36.0–46.0)
HEMOGLOBIN: 14.3 g/dL (ref 12.0–15.0)
MCH: 28.7 pg (ref 26.0–34.0)
MCHC: 35 g/dL (ref 30.0–36.0)
MCV: 82.1 fL (ref 78.0–100.0)
RBC: 4.98 MIL/uL (ref 3.87–5.11)
RDW: 16.2 % — ABNORMAL HIGH (ref 11.5–15.5)
WBC: 6.9 10*3/uL (ref 4.0–10.5)

## 2013-08-06 DIAGNOSIS — Z029 Encounter for administrative examinations, unspecified: Secondary | ICD-10-CM

## 2013-08-07 ENCOUNTER — Ambulatory Visit (INDEPENDENT_AMBULATORY_CARE_PROVIDER_SITE_OTHER): Payer: Medicaid Other | Admitting: Obstetrics & Gynecology

## 2013-08-07 ENCOUNTER — Encounter: Payer: Self-pay | Admitting: Obstetrics & Gynecology

## 2013-08-07 VITALS — BP 110/60 | Wt 149.0 lb

## 2013-08-07 DIAGNOSIS — Z3483 Encounter for supervision of other normal pregnancy, third trimester: Secondary | ICD-10-CM

## 2013-08-07 DIAGNOSIS — Z331 Pregnant state, incidental: Secondary | ICD-10-CM

## 2013-08-07 DIAGNOSIS — Z1389 Encounter for screening for other disorder: Secondary | ICD-10-CM

## 2013-08-07 DIAGNOSIS — Z3480 Encounter for supervision of other normal pregnancy, unspecified trimester: Secondary | ICD-10-CM

## 2013-08-07 LAB — POCT URINALYSIS DIPSTICK
Blood, UA: NEGATIVE
Glucose, UA: NEGATIVE
KETONES UA: NEGATIVE
Nitrite, UA: NEGATIVE
Protein, UA: NEGATIVE

## 2013-08-07 LAB — OB RESULTS CONSOLE GC/CHLAMYDIA
Chlamydia: NEGATIVE
Gonorrhea: NEGATIVE

## 2013-08-07 NOTE — Addendum Note (Signed)
Addended by: Criss AlvinePULLIAM, CHRYSTAL G on: 08/07/2013 12:19 PM   Modules accepted: Orders

## 2013-08-07 NOTE — Progress Notes (Signed)
[id number] 2958w2d Estimated Date of Delivery: 08/26/13  Blood pressure 110/60, weight 149 lb (67.586 kg), last menstrual period 11/13/2012.   BP weight and urine results all reviewed and noted.  Please refer to the obstetrical flow sheet for the fundal height and fetal heart rate documentation:  Patient reports good fetal movement, denies any bleeding and no rupture of membranes symptoms or regular contractions. Patient is without complaints. All questions were answered.  Plan:  Continued routine obstetrical care,   Follow up in 1 weeks for OB appointment,

## 2013-08-08 LAB — GC/CHLAMYDIA PROBE AMP
CT Probe RNA: NEGATIVE
GC Probe RNA: NEGATIVE

## 2013-08-08 LAB — STREP B DNA PROBE: STREP GROUP B AG: NOT DETECTED

## 2013-08-11 ENCOUNTER — Encounter: Payer: Self-pay | Admitting: Obstetrics & Gynecology

## 2013-08-14 ENCOUNTER — Encounter: Payer: Self-pay | Admitting: Advanced Practice Midwife

## 2013-08-14 ENCOUNTER — Ambulatory Visit (INDEPENDENT_AMBULATORY_CARE_PROVIDER_SITE_OTHER): Payer: Medicaid Other | Admitting: Advanced Practice Midwife

## 2013-08-14 VITALS — BP 112/58 | Wt 153.0 lb

## 2013-08-14 DIAGNOSIS — Z87720 Personal history of (corrected) congenital malformations of eye: Secondary | ICD-10-CM

## 2013-08-14 DIAGNOSIS — Z1389 Encounter for screening for other disorder: Secondary | ICD-10-CM

## 2013-08-14 DIAGNOSIS — Z331 Pregnant state, incidental: Secondary | ICD-10-CM

## 2013-08-14 DIAGNOSIS — Z87790 Personal history of (corrected) congenital malformations of face and neck: Secondary | ICD-10-CM

## 2013-08-14 DIAGNOSIS — Z3483 Encounter for supervision of other normal pregnancy, third trimester: Secondary | ICD-10-CM

## 2013-08-14 DIAGNOSIS — Z87721 Personal history of (corrected) congenital malformations of ear: Secondary | ICD-10-CM

## 2013-08-14 DIAGNOSIS — Z3480 Encounter for supervision of other normal pregnancy, unspecified trimester: Secondary | ICD-10-CM

## 2013-08-14 DIAGNOSIS — D693 Immune thrombocytopenic purpura: Secondary | ICD-10-CM

## 2013-08-14 LAB — POCT URINALYSIS DIPSTICK
Glucose, UA: NEGATIVE
Ketones, UA: NEGATIVE
Leukocytes, UA: NEGATIVE
NITRITE UA: NEGATIVE
Protein, UA: NEGATIVE
RBC UA: NEGATIVE

## 2013-08-14 NOTE — Progress Notes (Signed)
[id number] 4110w2d Estimated Date of Delivery: 08/26/13  Blood pressure 112/58, weight 153 lb (69.4 kg), last menstrual period 11/13/2012.   BP weight and urine results all reviewed and noted.  Please refer to the obstetrical flow sheet for the fundal height and fetal heart rate documentation:  Patient reports good fetal movement, denies any bleeding and no rupture of membranes symptoms or regular contractions. Patient is without complaints. All questions were answered.  Plan:  Continued routine obstetrical care, recheck CBC (idiopathic thrombocytopenia)  Follow up in 1 weeks for OB appointment,

## 2013-08-15 DIAGNOSIS — D693 Immune thrombocytopenic purpura: Secondary | ICD-10-CM | POA: Insufficient documentation

## 2013-08-15 LAB — CBC
HCT: 41.3 % (ref 36.0–46.0)
Hemoglobin: 14.5 g/dL (ref 12.0–15.0)
MCH: 29.2 pg (ref 26.0–34.0)
MCHC: 35.1 g/dL (ref 30.0–36.0)
MCV: 83.1 fL (ref 78.0–100.0)
Platelets: 75 10*3/uL — ABNORMAL LOW (ref 150–400)
RBC: 4.97 MIL/uL (ref 3.87–5.11)
RDW: 16.3 % — AB (ref 11.5–15.5)
WBC: 7.2 10*3/uL (ref 4.0–10.5)

## 2013-08-17 ENCOUNTER — Inpatient Hospital Stay (HOSPITAL_COMMUNITY)
Admission: AD | Admit: 2013-08-17 | Discharge: 2013-08-19 | DRG: 775 | Disposition: A | Discharge status: Home / Self Care | Payer: Medicaid Other | Source: Ambulatory Visit | Attending: Obstetrics & Gynecology | Admitting: Obstetrics & Gynecology

## 2013-08-17 ENCOUNTER — Encounter (HOSPITAL_COMMUNITY): Payer: Self-pay | Admitting: *Deleted

## 2013-08-17 DIAGNOSIS — Z87720 Personal history of (corrected) congenital malformations of eye: Secondary | ICD-10-CM

## 2013-08-17 DIAGNOSIS — Z2839 Other underimmunization status: Secondary | ICD-10-CM

## 2013-08-17 DIAGNOSIS — Z818 Family history of other mental and behavioral disorders: Secondary | ICD-10-CM

## 2013-08-17 DIAGNOSIS — O9912 Other diseases of the blood and blood-forming organs and certain disorders involving the immune mechanism complicating childbirth: Principal | ICD-10-CM

## 2013-08-17 DIAGNOSIS — O09899 Supervision of other high risk pregnancies, unspecified trimester: Secondary | ICD-10-CM

## 2013-08-17 DIAGNOSIS — Z87721 Personal history of (corrected) congenital malformations of ear: Secondary | ICD-10-CM

## 2013-08-17 DIAGNOSIS — Z87790 Personal history of (corrected) congenital malformations of face and neck: Secondary | ICD-10-CM

## 2013-08-17 DIAGNOSIS — O479 False labor, unspecified: Secondary | ICD-10-CM | POA: Diagnosis present

## 2013-08-17 DIAGNOSIS — D689 Coagulation defect, unspecified: Secondary | ICD-10-CM

## 2013-08-17 DIAGNOSIS — IMO0001 Reserved for inherently not codable concepts without codable children: Secondary | ICD-10-CM

## 2013-08-17 DIAGNOSIS — Z283 Underimmunization status: Secondary | ICD-10-CM

## 2013-08-17 DIAGNOSIS — D693 Immune thrombocytopenic purpura: Secondary | ICD-10-CM

## 2013-08-17 DIAGNOSIS — Z3490 Encounter for supervision of normal pregnancy, unspecified, unspecified trimester: Secondary | ICD-10-CM

## 2013-08-17 DIAGNOSIS — Z3483 Encounter for supervision of other normal pregnancy, third trimester: Secondary | ICD-10-CM

## 2013-08-17 LAB — CBC
HCT: 43 % (ref 36.0–46.0)
HEMOGLOBIN: 15 g/dL (ref 12.0–15.0)
MCH: 29.7 pg (ref 26.0–34.0)
MCHC: 34.9 g/dL (ref 30.0–36.0)
MCV: 85.1 fL (ref 78.0–100.0)
Platelets: 67 10*3/uL — ABNORMAL LOW (ref 150–400)
RBC: 5.05 MIL/uL (ref 3.87–5.11)
RDW: 16.3 % — ABNORMAL HIGH (ref 11.5–15.5)
WBC: 6 10*3/uL (ref 4.0–10.5)

## 2013-08-17 LAB — ABO/RH: ABO/RH(D): O POS

## 2013-08-17 LAB — TYPE AND SCREEN
ABO/RH(D): O POS
Antibody Screen: NEGATIVE

## 2013-08-17 LAB — RPR

## 2013-08-17 MED ORDER — SIMETHICONE 80 MG PO CHEW: 80.0000 mg | CHEWABLE_TABLET | ORAL | Status: DC | PRN

## 2013-08-17 MED ORDER — FENTANYL CITRATE 0.05 MG/ML IJ SOLN: 100.0000 ug | INTRAMUSCULAR | Status: DC | PRN

## 2013-08-17 MED ORDER — ONDANSETRON HCL 4 MG/2ML IJ SOLN
4.0000 mg | INTRAMUSCULAR | Status: DC | PRN
Start: 2013-08-17 — End: 2013-08-19

## 2013-08-17 MED ORDER — IBUPROFEN 600 MG PO TABS: 600.0000 mg | ORAL_TABLET | Freq: Four times a day (QID) | ORAL | Status: DC | PRN

## 2013-08-17 MED ORDER — FENTANYL 2.5 MCG/ML BUPIVACAINE 1/10 % EPIDURAL INFUSION (WH - ANES)
14.0000 mL/h | INTRAMUSCULAR | Status: DC | PRN
Start: 2013-08-17 — End: 2013-08-17

## 2013-08-17 MED ORDER — OXYTOCIN BOLUS FROM INFUSION
500.0000 mL | INTRAVENOUS | Status: DC
  Administered 2013-08-17: 500 mL via INTRAVENOUS

## 2013-08-17 MED ORDER — ONDANSETRON HCL 4 MG PO TABS
4.0000 mg | ORAL_TABLET | ORAL | Status: DC | PRN
Start: 2013-08-17 — End: 2013-08-19

## 2013-08-17 MED ORDER — WITCH HAZEL-GLYCERIN EX PADS
1.0000 "application " | MEDICATED_PAD | CUTANEOUS | Status: DC | PRN
  Administered 2013-08-18: 1 via TOPICAL

## 2013-08-17 MED ORDER — TERBUTALINE SULFATE 1 MG/ML IJ SOLN: 0.2500 mg | Freq: Once | INTRAMUSCULAR | Status: DC | PRN

## 2013-08-17 MED ORDER — IBUPROFEN 600 MG PO TABS: 600.0000 mg | ORAL_TABLET | Freq: Four times a day (QID) | ORAL | Status: DC

## 2013-08-17 MED ORDER — FLEET ENEMA 7-19 GM/118ML RE ENEM: 1.0000 | ENEMA | RECTAL | Status: DC | PRN

## 2013-08-17 MED ORDER — LACTATED RINGERS IV SOLN: 500.0000 mL | Freq: Once | INTRAVENOUS | Status: DC

## 2013-08-17 MED ORDER — OXYTOCIN 40 UNITS IN LACTATED RINGERS INFUSION - SIMPLE MED: 62.5000 mL/h | INTRAVENOUS | Status: DC

## 2013-08-17 MED ORDER — LIDOCAINE HCL (PF) 1 % IJ SOLN
30.0000 mL | INTRAMUSCULAR | Status: DC | PRN
  Filled 2013-08-17: qty 30

## 2013-08-17 MED ORDER — ACETAMINOPHEN 325 MG PO TABS: 650.0000 mg | ORAL_TABLET | ORAL | Status: DC | PRN

## 2013-08-17 MED ORDER — PHENYLEPHRINE 40 MCG/ML (10ML) SYRINGE FOR IV PUSH (FOR BLOOD PRESSURE SUPPORT)
80.0000 ug | PREFILLED_SYRINGE | INTRAVENOUS | Status: DC | PRN
  Filled 2013-08-17: qty 2

## 2013-08-17 MED ORDER — LACTATED RINGERS IV SOLN: 500.0000 mL | INTRAVENOUS | Status: DC | PRN

## 2013-08-17 MED ORDER — SENNOSIDES-DOCUSATE SODIUM 8.6-50 MG PO TABS
2.0000 | ORAL_TABLET | ORAL | Status: DC
  Administered 2013-08-18 (×2): 2 via ORAL
  Filled 2013-08-17 (×2): qty 2

## 2013-08-17 MED ORDER — LANOLIN HYDROUS EX OINT: TOPICAL_OINTMENT | CUTANEOUS | Status: DC | PRN

## 2013-08-17 MED ORDER — EPHEDRINE 5 MG/ML INJ
10.0000 mg | INTRAVENOUS | Status: DC | PRN
  Filled 2013-08-17: qty 2

## 2013-08-17 MED ORDER — DIPHENHYDRAMINE HCL 25 MG PO CAPS: 25.0000 mg | ORAL_CAPSULE | Freq: Four times a day (QID) | ORAL | Status: DC | PRN

## 2013-08-17 MED ORDER — FENTANYL CITRATE 0.05 MG/ML IJ SOLN
50.0000 ug | INTRAMUSCULAR | Status: DC | PRN
  Administered 2013-08-17: 50 ug via INTRAVENOUS
  Filled 2013-08-17 (×3): qty 2

## 2013-08-17 MED ORDER — OXYCODONE-ACETAMINOPHEN 5-325 MG PO TABS
1.0000 | ORAL_TABLET | ORAL | Status: DC | PRN
  Administered 2013-08-17 – 2013-08-19 (×4): 1 via ORAL
  Filled 2013-08-17 (×4): qty 1

## 2013-08-17 MED ORDER — TETANUS-DIPHTH-ACELL PERTUSSIS 5-2.5-18.5 LF-MCG/0.5 IM SUSP
0.5000 mL | Freq: Once | INTRAMUSCULAR | Status: AC
  Administered 2013-08-18: 0.5 mL via INTRAMUSCULAR
  Filled 2013-08-17: qty 0.5

## 2013-08-17 MED ORDER — BENZOCAINE-MENTHOL 20-0.5 % EX AERO: 1.0000 "application " | INHALATION_SPRAY | CUTANEOUS | Status: DC | PRN

## 2013-08-17 MED ORDER — OXYTOCIN 40 UNITS IN LACTATED RINGERS INFUSION - SIMPLE MED
1.0000 m[IU]/min | INTRAVENOUS | Status: DC
  Administered 2013-08-17: 2 m[IU]/min via INTRAVENOUS
  Filled 2013-08-17: qty 1000

## 2013-08-17 MED ORDER — MEASLES, MUMPS & RUBELLA VAC ~~LOC~~ INJ
0.5000 mL | INJECTION | Freq: Once | SUBCUTANEOUS | Status: DC
  Filled 2013-08-17: qty 0.5

## 2013-08-17 MED ORDER — CITRIC ACID-SODIUM CITRATE 334-500 MG/5ML PO SOLN: 30.0000 mL | ORAL | Status: DC | PRN

## 2013-08-17 MED ORDER — DIPHENHYDRAMINE HCL 50 MG/ML IJ SOLN: 12.5000 mg | INTRAMUSCULAR | Status: DC | PRN

## 2013-08-17 MED ORDER — LACTATED RINGERS IV SOLN
INTRAVENOUS | Status: DC
  Administered 2013-08-17 (×2): via INTRAVENOUS

## 2013-08-17 MED ORDER — ONDANSETRON HCL 4 MG/2ML IJ SOLN: 4.0000 mg | Freq: Four times a day (QID) | INTRAMUSCULAR | Status: DC | PRN

## 2013-08-17 MED ORDER — ZOLPIDEM TARTRATE 5 MG PO TABS: 5.0000 mg | ORAL_TABLET | Freq: Every evening | ORAL | Status: DC | PRN

## 2013-08-17 MED ORDER — MISOPROSTOL 200 MCG PO TABS
ORAL_TABLET | ORAL | Status: AC
  Filled 2013-08-17: qty 5

## 2013-08-17 MED ORDER — DIBUCAINE 1 % RE OINT
1.0000 "application " | TOPICAL_OINTMENT | RECTAL | Status: DC | PRN
  Administered 2013-08-18: 1 via RECTAL
  Filled 2013-08-17: qty 28

## 2013-08-17 MED ORDER — PRENATAL MULTIVITAMIN CH
1.0000 | ORAL_TABLET | Freq: Every day | ORAL | Status: DC
  Administered 2013-08-18 – 2013-08-19 (×2): 1 via ORAL
  Filled 2013-08-17 (×2): qty 1

## 2013-08-17 NOTE — MAU Note (Signed)
PT  SAYS SHE  W[facility] Tuesday  VE 3-4 CM.  GBS- NEG.  DENIES HSV AND MRSA.   HURT BAD   SINCE 0200

## 2013-08-17 NOTE — Progress Notes (Signed)
Kimberly Ramsey is a 28 y.o. G3P2002 at 373w5d admitted for active labor  Subjective: Pt. Does not desire epidural at this time. She is feeling contractions q5 min. She has agreed to receive pitocin for augmentation of labor.   Objective: BP 99/65  Pulse 99  Temp(Src) 97.4 F (36.3 C) (Axillary)  Resp 20  Ht 5\' 5"  (1.651 m)  Wt 69.4 kg (153 lb)  BMI 25.46 kg/m2  SpO2 98%  LMP 11/13/2012      FHT:  FHR: 140's bpm, variability: moderate,  accelerations:  Present,  decelerations:  Absent UC:   regular, every 5 minutes SVE:   Dilation: 6 Effacement (%): 50 Station: -2 Exam by:: Melancon, MD  Labs: Lab Results  Component Value Date   WBC 6.0 08/17/2013   HGB 15.0 08/17/2013   HCT 43.0 08/17/2013   MCV 85.1 08/17/2013   PLT 67* 08/17/2013    Assessment / Plan: Augmentation of labor, progressing well  Labor: Progressing on Pitocin, will continue to increase then AROM s/p attempted AROM. Will continue pit and then recheck.  Preeclampsia:  no signs or symptoms of toxicity Fetal Wellbeing:  Category I Pain Control:  Fentanyl I/D:  n/a Anticipated MOD:  NSVD Thrombocytopenia: Plts. 66 this am.   Melancon, Caleb G 08/17/2013, 10:52 AM  I have read and agree with above.   Kimberly Ramsey, Redmond BasemanKELI L, MD

## 2013-08-17 NOTE — Progress Notes (Signed)
   Kimberly Ramsey is a 28 y.o. G3P2002 at 3618w5d  admitted for active labor  Subjective: Breathing with contractions  Objective: Filed Vitals:   08/17/13 0304 08/17/13 0355 08/17/13 0400 08/17/13 0635  BP: 113/74 117/68  112/81  Pulse: 103 88  93  Temp: 98.7 F (37.1 C) 98 F (36.7 C)    TempSrc: Oral Oral    Resp: 20 18    Height:   5\' 5"  (1.651 m)   Weight:   69.4 kg (153 lb)   SpO2: 98%         FHT:  FHR: 140 bpm, variability: moderate,  accelerations:  Present,  decelerations:  Absent UC:   irregular, every 1-3 minutes SVE:   Dilation: 6 Effacement (%): 70 Station: -1 Exam by:: Cres-Dishmon,CNM  Labs: Lab Results  Component Value Date   WBC 6.0 08/17/2013   HGB 15.0 08/17/2013   HCT 43.0 08/17/2013   MCV 85.1 08/17/2013   PLT 67* 08/17/2013    Assessment / Plan: Spontaneous labor, progressing normally Thrombocytopenia, does not want epidural. Labor: Progressing normally Fetal Wellbeing:  Category I Pain Control:  Fentanyl Anticipated MOD:  NSVD  CRESENZO-DISHMAN,Katie Moch 08/17/2013, 6:44 AM
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
## 2013-08-17 NOTE — Progress Notes (Signed)
Kimberly Ramsey is a 28 y.o. G3P2002 at 3783w5d admitted for active labor  Subjective: Stalled out labor at last check. Now on pit and in a better pattern. +FM.   Objective: BP 112/65  Pulse 90  Temp(Src) 97.1 F (36.2 C) (Axillary)  Resp 20  Ht 5\' 5"  (1.651 m)  Wt 69.4 kg (153 lb)  BMI 25.46 kg/m2  SpO2 98%  LMP 11/13/2012     FHT:  FHR: 145 bpm, variability: moderate,  accelerations:  Present,  decelerations:  Absent UC:   regular, every 2-3 minutes SVE:   Dilation: 6 Effacement (%): 50 Station: -2 Exam by:: Dr. Reola Ramsey   Labs: Lab Results  Component Value Date   WBC 6.0 08/17/2013   HGB 15.0 08/17/2013   HCT 43.0 08/17/2013   MCV 85.1 08/17/2013   PLT 67* 08/17/2013    Assessment / Plan: Augmentation of protracted active labor  Labor: now on pit. AROM performed with return of scant amount of clear fluid Fetal Wellbeing:  Category I Pain Control:  Labor support without medications I/D:  n/a Anticipated MOD:  NSVD  Kimberly Ramsey L 08/17/2013, 4:33 PM

## 2013-08-18 LAB — CBC
HCT: 41.7 % (ref 36.0–46.0)
Hemoglobin: 14.3 g/dL (ref 12.0–15.0)
MCH: 29.4 pg (ref 26.0–34.0)
MCHC: 34.3 g/dL (ref 30.0–36.0)
MCV: 85.8 fL (ref 78.0–100.0)
Platelets: 76 10*3/uL — ABNORMAL LOW (ref 150–400)
RBC: 4.86 MIL/uL (ref 3.87–5.11)
RDW: 16.4 % — AB (ref 11.5–15.5)
WBC: 11.7 10*3/uL — ABNORMAL HIGH (ref 4.0–10.5)

## 2013-08-18 NOTE — Progress Notes (Signed)
Post Partum Day 1 Subjective: no complaints, up ad lib, voiding and tolerating PO Breast feeding without difficulty   Objective: Blood pressure 96/55, pulse 97, temperature 97.8 F (36.6 C), temperature source Oral, resp. rate 20, height 5\' 5"  (1.651 m), weight 69.4 kg (153 lb), last menstrual period 11/13/2012, SpO2 100.00%, unknown if currently breastfeeding.  Physical Exam:  General: alert, cooperative and no distress Lochia: appropriate Uterine Fundus: firm Incision: n/a DVT Evaluation: No evidence of DVT seen on physical exam. No significant calf/ankle edema.   Recent Labs  08/17/13 0330 08/18/13 0546  HGB 15.0 14.3  HCT 43.0 41.7    Assessment/Plan: Plan for discharge tomorrow, Breastfeeding, Lactation consult and Contraception mirena F/up at [facility] in 4-6 weeks   LOS: 1 day   Anselm LisMarsh, Melanie 08/18/2013, 7:35 AM   I spoke with and examined patient and agree with resident's note and plan of care.  Tawana ScaleMichael Ryan Jakobi Thetford, MD OB Fellow 08/18/2013 11:16 AM

## 2013-08-18 NOTE — Lactation Note (Signed)
This note was copied from the chart of Kimberly Tedi Leabo. Lactation Consultation Note Moms 3rd baby. BF 1st baby for 9 months, BF 2nd baby for 8 months. Denied BF difficulties. Has good everted nipples, hand expression taught. Denies pain during BF, states "its going good". Mom encouraged to feed baby 8-12 times/24 hours and with feeding cues. Mom encouraged to feed baby w/feeding cues. Educated about newborn behavior. Referred to Baby and Me Book in Breastfeeding section Pg. 22-23 for position options and Proper latch demonstration.Encouraged to call for assistance if needed and to verify proper latch.WH/LC brochure given w/resources, support groups and LC services. Patient Name: Kimberly Ramsey Today's Date: 08/18/2013     Maternal Data    Feeding    LATCH Score/Interventions                      Lactation Tools Discussed/Used     Consult Status      Kimberly Ramsey, Kimberly Ramsey G 08/18/2013, 10:44 AM

## 2013-08-19 MED ORDER — ACETAMINOPHEN 325 MG PO TABS: 650.0000 mg | ORAL_TABLET | ORAL | Status: DC | PRN

## 2013-08-19 NOTE — Lactation Note (Signed)
This note was copied from the chart of Kimberly Hinley Uno. Lactation Consultation Note  P3 Ex BF, denies soreness or problems. Mom encouraged to feed baby 8-12 times/24 hours and with feeding cues.  Reviewed engorgement and encouraged her to call if she needs assistance.  Patient Name: Kimberly Ramsey NWGNF'AToday's Date: 08/19/2013 Reason for consult: Follow-up assessment   Maternal Data    Feeding    LATCH Score/Interventions                      Lactation Tools Discussed/Used     Consult Status Consult Status: Complete    Hardie PulleyBerkelhammer, Ruth Boschen 08/19/2013, 12:33 PM

## 2013-08-19 NOTE — Discharge Instructions (Signed)

## 2013-08-19 NOTE — Discharge Summary (Signed)
`````  Attestation of Attending Supervision of Advanced Practitioner: Evaluation and management procedures were performed by the PA/NP/CNM/OB Fellow under my supervision/collaboration. Chart reviewed and agree with management and plan.  Azel Gumina V 08/19/2013 9:08 AM

## 2013-08-19 NOTE — Discharge Summary (Signed)
  Obstetric Discharge Summary Reason for Admission: onset of labor Prenatal Procedures: none Intrapartum Procedures: spontaneous vaginal delivery Postpartum Procedures: none Complications-Operative and Postpartum: none  Hospital Course:  Pt was admitted on 7/10 and slowly progressed to delivery at 1820 that evening. Note below for details of delivery. Pt did well PP. Pt was complicated with ITP and improved to 76. Stable for discharge. Meeting all milestones without bleeding. Pt is breast feeding and plans mirena for contraception at [facility]  Delivery Note  At 6:06 PM a viable female was delivered via Vaginal, Spontaneous Delivery (Presentation: ; Occiput Anterior). APGAR: 7, 9; weight TBD.  Placenta status: Intact, Spontaneous. Cord: 3 vessels with the following complications: none.  Anesthesia: None  Episiotomy: None  Lacerations: None  Suture Repair: na  Est. Blood Loss (mL): 300  Mom to postpartum. Baby to Couplet care / Skin to Skin.  Pt pushed with good maternal effort to deliver a liveborn female via NSVD with spontaneous cry. Baby placed on maternal abdomen. Delayed cord clamping performed. Cord cut by FOB. Placenta delivered intact with 3V cord via traction and pitocin. no tears. No complications. Mom and baby to postpartum.  BECK, KELI L  08/17/2013, 6:20 PM     H/H: Lab Results  Component Value Date/Time   HGB 14.3 08/18/2013  5:46 AM   HCT 41.7 08/18/2013  5:46 AM    Filed Vitals:   08/19/13 0539  BP: 116/76  Pulse: 64  Temp: 98 F (36.7 C)  Resp: 18    Physical Exam: VSS NAD Abd: Appropriately tender, ND, Fundus @U   But very firm, slightly mobile in abdomen No c/c/e, Neg homan's sign, neg cords Lochia Appropriate  Discharge Diagnoses: Term Pregnancy-delivered  Discharge Information: Activity: pelvic rest Diet: routine  Medications: PNV and APAP Breast feeding:  Yes Condition: stable Instructions: refer to handout Discharge to: home      Medication  List         acetaminophen 325 MG tablet  Commonly known as:  TYLENOL  Take 2 tablets (650 mg total) by mouth every 4 (four) hours as needed for mild pain.     prenatal multivitamin Tabs tablet  Take 1 tablet by mouth daily at 12 noon.           Follow-up Information   [facility] In 4 weeks. (For Postpartum Visit and mirena placement)    Specialty:  Obstetrics and Gynecology   Contact information:   [address] [place] [address] [phone]      Tawana ScaleODOM, Lynessa Almanzar RYAN 08/19/2013,8:45 AM
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
## 2013-08-21 ENCOUNTER — Encounter: Payer: Medicaid Other | Admitting: Advanced Practice Midwife

## 2013-08-22 NOTE — Progress Notes (Signed)
Post discharge chart review completed.  

## 2013-09-19 ENCOUNTER — Ambulatory Visit (INDEPENDENT_AMBULATORY_CARE_PROVIDER_SITE_OTHER): Payer: Medicaid Other | Admitting: Women's Health

## 2013-09-19 ENCOUNTER — Encounter: Payer: Self-pay | Admitting: Women's Health

## 2013-09-19 VITALS — BP 116/66 | Ht 65.0 in | Wt 131.0 lb

## 2013-09-19 DIAGNOSIS — Z3009 Encounter for other general counseling and advice on contraception: Secondary | ICD-10-CM

## 2013-09-19 DIAGNOSIS — D696 Thrombocytopenia, unspecified: Secondary | ICD-10-CM

## 2013-09-19 DIAGNOSIS — O9279 Other disorders of lactation: Secondary | ICD-10-CM

## 2013-09-19 DIAGNOSIS — L989 Disorder of the skin and subcutaneous tissue, unspecified: Secondary | ICD-10-CM

## 2013-09-19 DIAGNOSIS — O99119 Other diseases of the blood and blood-forming organs and certain disorders involving the immune mechanism complicating pregnancy, unspecified trimester: Principal | ICD-10-CM

## 2013-09-19 LAB — CBC
HCT: 47.7 % — ABNORMAL HIGH (ref 36.0–46.0)
Hemoglobin: 16.6 g/dL — ABNORMAL HIGH (ref 12.0–15.0)
MCH: 28.3 pg (ref 26.0–34.0)
MCHC: 34.8 g/dL (ref 30.0–36.0)
MCV: 81.4 fL (ref 78.0–100.0)
Platelets: 140 10*3/uL — ABNORMAL LOW (ref 150–400)
RBC: 5.86 MIL/uL — AB (ref 3.87–5.11)
RDW: 14.3 % (ref 11.5–15.5)
WBC: 5.3 10*3/uL (ref 4.0–10.5)

## 2013-09-19 MED ORDER — CEPHALEXIN 500 MG PO CAPS: 500.0000 mg | ORAL_CAPSULE | Freq: Four times a day (QID) | ORAL | Status: DC

## 2013-09-19 NOTE — Patient Instructions (Signed)
No sex until after Mirena placed  Tips To Increase Milk Supply  Lots of water! Enough so that your urine is clear  Plenty of calories, if you're not getting enough calories, your milk supply can decrease  Breastfeed/pump often, every 2-3 hours x 20-2630mins  Fenugreek 3 pills 3 times a day, this may make your urine smell like maple syrup  Mother's Milk Tea  Lactation cookies, google for the recipe  Real oatmeal   Levonorgestrel intrauterine device (IUD)-Mirena What is this medicine? LEVONORGESTREL IUD (LEE voe nor jes trel) is a contraceptive (birth control) device. The device is placed inside the uterus by a healthcare professional. It is used to prevent pregnancy and can also be used to treat heavy bleeding that occurs during your period. Depending on the device, it can be used for 3 to 5 years. This medicine may be used for other purposes; ask your health care provider or pharmacist if you have questions. COMMON BRAND NAME(S): Elveria RoyalsLILETTA, Mirena, Skyla What should I tell my health care provider before I take this medicine? They need to know if you have any of these conditions: -abnormal Pap smear -cancer of the breast, uterus, or cervix -diabetes -endometritis -genital or pelvic infection now or in the past -have more than one sexual partner or your partner has more than one partner -heart disease -history of an ectopic or tubal pregnancy -immune system problems -IUD in place -liver disease or tumor -problems with blood clots or take blood-thinners -use intravenous drugs -uterus of unusual shape -vaginal bleeding that has not been explained -an unusual or allergic reaction to levonorgestrel, other hormones, silicone, or polyethylene, medicines, foods, dyes, or preservatives -pregnant or trying to get pregnant -breast-feeding How should I use this medicine? This device is placed inside the uterus by a health care professional. Talk to your pediatrician regarding the use of  this medicine in children. Special care may be needed. Overdosage: If you think you have taken too much of this medicine contact a poison control center or emergency room at once. NOTE: This medicine is only for you. Do not share this medicine with others. What if I miss a dose? This does not apply. What may interact with this medicine? Do not take this medicine with any of the following medications: -amprenavir -bosentan -fosamprenavir This medicine may also interact with the following medications: -aprepitant -barbiturate medicines for inducing sleep or treating seizures -bexarotene -griseofulvin -medicines to treat seizures like carbamazepine, ethotoin, felbamate, oxcarbazepine, phenytoin, topiramate -modafinil -pioglitazone -rifabutin -rifampin -rifapentine -some medicines to treat HIV infection like atazanavir, indinavir, lopinavir, nelfinavir, tipranavir, ritonavir -St. John's wort -warfarin This list may not describe all possible interactions. Give your health care provider a list of all the medicines, herbs, non-prescription drugs, or dietary supplements you use. Also tell them if you smoke, drink alcohol, or use illegal drugs. Some items may interact with your medicine. What should I watch for while using this medicine? Visit your doctor or health care professional for regular check ups. See your doctor if you or your partner has sexual contact with others, becomes HIV positive, or gets a sexual transmitted disease. This product does not protect you against HIV infection (AIDS) or other sexually transmitted diseases. You can check the placement of the IUD yourself by reaching up to the top of your vagina with clean fingers to feel the threads. Do not pull on the threads. It is a good habit to check placement after each menstrual period. Call your doctor right away if you  feel more of the IUD than just the threads or if you cannot feel the threads at all. The IUD may come out by  itself. You may become pregnant if the device comes out. If you notice that the IUD has come out use a backup birth control method like condoms and call your health care provider. Using tampons will not change the position of the IUD and are okay to use during your period. What side effects may I notice from receiving this medicine? Side effects that you should report to your doctor or health care professional as soon as possible: -allergic reactions like skin rash, itching or hives, swelling of the face, lips, or tongue -fever, flu-like symptoms -genital sores -high blood pressure -no menstrual period for 6 weeks during use -pain, swelling, warmth in the leg -pelvic pain or tenderness -severe or sudden headache -signs of pregnancy -stomach cramping -sudden shortness of breath -trouble with balance, talking, or walking -unusual vaginal bleeding, discharge -yellowing of the eyes or skin Side effects that usually do not require medical attention (report to your doctor or health care professional if they continue or are bothersome): -acne -breast pain -change in sex drive or performance -changes in weight -cramping, dizziness, or faintness while the device is being inserted -headache -irregular menstrual bleeding within first 3 to 6 months of use -nausea This list may not describe all possible side effects. Call your doctor for medical advice about side effects. You may report side effects to FDA at 1-800-FDA-1088. Where should I keep my medicine? This does not apply. NOTE: This sheet is a summary. It may not cover all possible information. If you have questions about this medicine, talk to your doctor, pharmacist, or health care provider.  2015, Elsevier/Gold Standard. (2011-02-25 13:54:04)

## 2013-09-19 NOTE — Progress Notes (Signed)
Patient ID: Kimberly Ramsey Koob, female   DOB: 07/06/1985, 28 y.o.   MRN: [id number] Subjective:    Kimberly Ramsey Paiva is a 28 y.o. [id number] African American female who presents for a postpartum visit. She is 4 weeks postpartum following a spontaneous vaginal delivery at 38.5 gestational weeks. Anesthesia: none. I have fully reviewed the prenatal and intrapartum course. Postpartum course has been uncomplicated. Baby's course has been uncomplicated. Baby is feeding by breast and bottle d/t decreased milk supply. Bleeding no bleeding. Bowel function is normal. Bladder function is normal. Patient is not sexually active. Last sexual activity: prior to birth of baby. Contraception method is abstinence and desires mirena. Postpartum depression screening: negative. Score 1.  Last pap Nov 2014 and was neg. 2 open sores, 1 on mons and 1 on upper arm, she thought they were bug bites. One on mons drained foul smelling d/c.   The following portions of the patient's history were reviewed and updated as appropriate: allergies, current medications, past medical history, past surgical history and problem list.  Review of Systems Pertinent items are noted in HPI.   Filed Vitals:   09/19/13 1408  BP: 116/66  Height: 5\' 5"  (1.651 m)  Weight: 131 lb (59.421 kg)   No LMP recorded.  Objective:   General:  alert, cooperative and no distress   Breasts:  deferred, no complaints  Lungs: clear to auscultation bilaterally  Extremities Ulcerated lesion Lt upper arm  Heart:  regular rate and rhythm  Abdomen: soft, nontender   Vulva: Normal, ulcerated lesion on Lt mons, culture obtained  Vagina: normal vagina  Cervix:  closed  Corpus: Well-involuted  Adnexa:  Non-palpable  Rectal Exam: no hemorrhoids        Assessment:   Postpartum exam 4 wks s/p SVD Breast/bottlefeeding Decreased milk supply 2 ulcerated lesions, 1 on mons and 1 on upper arm Depression screening Contraception counseling  Gestational  thrombocytopenia  Plan:  Rx keflex QID x 7d for skin lesions Contraception: abstinence until after mirena Follow up in: asap for mirena placement or earlier if needed Printed info on ways to increase milk supply given Recheck platelets today  Marge DuncansBooker, Keller Mikels Randall CNM, [facility] 09/19/2013 2:51 PM

## 2013-09-22 LAB — WOUND CULTURE
GRAM STAIN: NONE SEEN
GRAM STAIN: NONE SEEN
GRAM STAIN: NONE SEEN
ORGANISM ID, BACTERIA: NO GROWTH

## 2013-10-02 ENCOUNTER — Ambulatory Visit: Payer: Self-pay | Admitting: Advanced Practice Midwife

## 2013-10-03 ENCOUNTER — Ambulatory Visit (INDEPENDENT_AMBULATORY_CARE_PROVIDER_SITE_OTHER): Payer: Medicaid Other | Admitting: Advanced Practice Midwife

## 2013-10-03 ENCOUNTER — Encounter: Payer: Self-pay | Admitting: Advanced Practice Midwife

## 2013-10-03 VITALS — BP 110/64 | Ht 65.0 in | Wt 134.0 lb

## 2013-10-03 DIAGNOSIS — Z3202 Encounter for pregnancy test, result negative: Secondary | ICD-10-CM

## 2013-10-03 DIAGNOSIS — Z30430 Encounter for insertion of intrauterine contraceptive device: Secondary | ICD-10-CM

## 2013-10-03 DIAGNOSIS — Z0000 Encounter for general adult medical examination without abnormal findings: Secondary | ICD-10-CM | POA: Insufficient documentation

## 2013-10-03 HISTORY — DX: Encounter for insertion of intrauterine contraceptive device: Z30.430

## 2013-10-03 LAB — POCT URINE PREGNANCY: PREG TEST UR: NEGATIVE

## 2013-10-03 NOTE — Progress Notes (Addendum)
Kimberly Ramsey is a 28 y.o. year old African American female G3P3 who presents for placement of a Mirena IUD. She has not been sexually active since delivery and her pregnancy test today is negative.  The risks and benefits of the method and placement have been thouroughly reviewed with the patient and all questions were answered.  Specifically the patient is aware of failure rate of 02/998, expulsion of the IUD and of possible perforation.  The patient is aware of irregular bleeding due to the method and understands the incidence of irregular bleeding diminishes with time.  Time out was performed.  A Tostenson speculum was placed.  The cervix was prepped using Betadine. The uterus was found to be retroflexed and it sounded to 8 cm.  The cervix was grasped with a tenaculum and the IUD was inserted to 8 cm.  It was pulled back 1 cm and the IUD was disengaged.  The strings were trimmed to 3 cm.  Sonogram was performed and the proper placement of the IUD was verified.  The patient was instructed on signs and symptoms of infection and to check for the strings after each menses or each month.  The patient is to refrain from intercourse for 3 days.  The patient is scheduled for a return appointment after her first menses or 4 weeks.  CRESENZO-DISHMAN,Rontae Inglett 10/03/2013 3:01 PM

## 2013-10-31 ENCOUNTER — Encounter: Payer: Self-pay | Admitting: Advanced Practice Midwife

## 2013-10-31 ENCOUNTER — Ambulatory Visit (INDEPENDENT_AMBULATORY_CARE_PROVIDER_SITE_OTHER): Payer: Medicaid Other | Admitting: Advanced Practice Midwife

## 2013-10-31 VITALS — BP 120/70 | Wt 140.4 lb

## 2013-10-31 DIAGNOSIS — Z30431 Encounter for routine checking of intrauterine contraceptive device: Secondary | ICD-10-CM

## 2013-10-31 NOTE — Progress Notes (Signed)
Family [facility] Clinic Visit  Patient name: Kimberly Ramsey MRN [id number]  Date of birth: Nov 14, 1985  CC & HPI:  Kimberly Ramsey is a 28 y.o. African American female presenting today for IUD check up.  She had Mirena placed a month ago and has not had any problems. She has minimal bleeding, and is not able to check her strings.  Pertinent History Reviewed:  Medical & Surgical Hx:   Past Medical History  Diagnosis Date  . Cleft lip and palate   . Thrombocytopenia complicating pregnancy    Past Surgical History  Procedure Laterality Date  . Cleft palate repair    . Hip surgery      took bone from hip to fix cleft palate.   Medications: Reviewed & Updated - see associated section Social History: Reviewed -  reports that she has never smoked. She has never used smokeless tobacco.  Objective Findings:  Vitals: Wt 140 lb 6.4 oz (63.685 kg)  Physical Examination: General appearance - alert, well appearing, and in no distress Mental status - alert, oriented to person, place, and time Pelvic - SSE:  Normal dc, strings visible and tucked behind cx  No results found for this or any previous visit (from the past 24 hour(s)).   Assessment & Plan:  A:   IUD check up P:     F/U prn   CRESENZO-DISHMAN,Monigue Spraggins CNM 10/31/2013 11:38 AM

## 2013-12-10 ENCOUNTER — Encounter: Payer: Self-pay | Admitting: Advanced Practice Midwife

## 2014-02-08 ENCOUNTER — Encounter (HOSPITAL_COMMUNITY): Payer: Self-pay

## 2014-02-08 ENCOUNTER — Emergency Department (HOSPITAL_COMMUNITY): Payer: Medicaid Other

## 2014-02-08 ENCOUNTER — Emergency Department (HOSPITAL_COMMUNITY)
Admission: EM | Admit: 2014-02-08 | Discharge: 2014-02-08 | Disposition: A | Discharge status: Home / Self Care | Payer: Medicaid Other | Attending: Emergency Medicine | Admitting: Emergency Medicine

## 2014-02-08 DIAGNOSIS — Z3202 Encounter for pregnancy test, result negative: Secondary | ICD-10-CM | POA: Diagnosis not present

## 2014-02-08 DIAGNOSIS — R059 Cough, unspecified: Secondary | ICD-10-CM

## 2014-02-08 DIAGNOSIS — Z792 Long term (current) use of antibiotics: Secondary | ICD-10-CM | POA: Insufficient documentation

## 2014-02-08 DIAGNOSIS — R05 Cough: Secondary | ICD-10-CM

## 2014-02-08 DIAGNOSIS — B349 Viral infection, unspecified: Secondary | ICD-10-CM | POA: Diagnosis not present

## 2014-02-08 DIAGNOSIS — J029 Acute pharyngitis, unspecified: Secondary | ICD-10-CM | POA: Diagnosis present

## 2014-02-08 LAB — URINALYSIS, ROUTINE W REFLEX MICROSCOPIC
Bilirubin Urine: NEGATIVE
GLUCOSE, UA: NEGATIVE mg/dL
Ketones, ur: NEGATIVE mg/dL
LEUKOCYTES UA: NEGATIVE
Nitrite: NEGATIVE
Protein, ur: NEGATIVE mg/dL
Specific Gravity, Urine: 1.02 (ref 1.005–1.030)
UROBILINOGEN UA: 0.2 mg/dL (ref 0.0–1.0)
pH: 6.5 (ref 5.0–8.0)

## 2014-02-08 LAB — URINE MICROSCOPIC-ADD ON

## 2014-02-08 LAB — RAPID STREP SCREEN (MED CTR MEBANE ONLY): STREPTOCOCCUS, GROUP A SCREEN (DIRECT): NEGATIVE

## 2014-02-08 LAB — PREGNANCY, URINE: Preg Test, Ur: NEGATIVE

## 2014-02-08 MED ORDER — KETOROLAC TROMETHAMINE 60 MG/2ML IM SOLN
60.0000 mg | Freq: Once | INTRAMUSCULAR | Status: AC
  Administered 2014-02-08: 60 mg via INTRAMUSCULAR
  Filled 2014-02-08: qty 2

## 2014-02-08 NOTE — ED Notes (Signed)
Pt c/o headache, sore throat, and fever x 6 days.

## 2014-02-08 NOTE — ED Provider Notes (Signed)
CSN: [id number]     Arrival date & time 02/08/14  1015 History  This chart was scribed for Kimberly Octave, MD by Ronney Lion, ED Scribe. This patient was seen in room APA03/APA03 and the patient's care was started at 10:40 AM.    Chief Complaint  Patient presents with  . Headache  . Sore Throat   Patient is a 29 y.o. female presenting with pharyngitis. The history is provided by the patient. No language interpreter was used.  Sore Throat     HPI Comments: Kimberly Ramsey is a 29 y.o. female who presents to the Emergency Department complaining of constant sore throat and constant, waxing and waning, pounding headache that began 6 days ago. She initially had a fever of 101 that has since resolved. She endorses difficulty swallowing, rhinorrhea, productive cough, nausea, abdominal pain that has since resolved, and SOB this morning that has since resolved. Patient has tried Excedrin, Tylenol, Theraflu, and caffeine with no relief. She denies a history of migraines. She denies any health problems or daily medications. 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 She denies vomiting. Patient is currently on Mirena for [facility]; she denies a possibility of pregnancy. She currently does not have a PCP.   Past Medical History  Diagnosis Date  . Cleft lip and palate   . Thrombocytopenia complicating pregnancy    Past Surgical History  Procedure Laterality Date  . Cleft palate repair    . Hip surgery      took bone from hip to fix cleft palate.   Family History  Problem Relation Age of Onset  . Mental illness Mother   . Schizophrenia Mother   . Lupus Father    History  Substance Use Topics  . Smoking status: Never Smoker   . Smokeless tobacco: Never Used  . Alcohol Use: No   OB History    Gravida Para Term Preterm AB TAB SAB Ectopic Multiple Living   Review of Systems  A complete 10  system review of systems was obtained and all systems are negative except as noted in the HPI and PMH.    Allergies  Codeine  Home Medications   Prior to Admission medications   Medication Sig Start Date End Date Taking? Authorizing Provider  levonorgestrel (MIRENA) 20 MCG/24HR IUD 1 each by Intrauterine route once.   Yes Historical Provider, MD  acetaminophen (TYLENOL) 325 MG tablet Take 2 tablets (650 mg total) by mouth every 4 (four) hours as needed for mild pain. 08/19/13   Minta Balsam, MD  cephALEXin (KEFLEX) 500 MG capsule Take 1 capsule (500 mg total) by mouth 4 (four) times daily. X 7 days Patient not taking: Reported on 02/08/2014 09/19/13   Marge Duncans, CNM   BP 113/81 mmHg  Pulse 99  Temp(Src) 98.3 F (36.8 C) (Oral)  Resp 20  Ht  (1.651 m)  Wt 130 lb (58.968 kg)  BMI 21.63 kg/m2  SpO2 99%  LMP 02/01/2014  Breastfeeding? Yes Physical Exam  Constitutional: She is oriented to person, place, and time. She appears well-developed and well-nourished. No distress.  HENT:  Head: Normocephalic and atraumatic.  Mouth/Throat: Oropharynx is clear and moist. No oropharyngeal exudate.  Mild erythema of throat. TMs normal.   Eyes: Conjunctivae and EOM are normal. Pupils are equal, round, and reactive  to light.  Neck: Normal range of motion. Neck supple.  No meningismus.  Cardiovascular: Normal rate, regular rhythm, normal heart sounds and intact distal pulses.   No murmur heard. Pulmonary/Chest: Effort normal and breath sounds normal. No respiratory distress.  Abdominal: Soft. There is no tenderness. There is no rebound and no guarding.  Musculoskeletal: Normal range of motion. She exhibits no edema or tenderness.  Neurological: She is alert and oriented to person, place, and time. No cranial nerve deficit. She exhibits normal muscle tone. Coordination normal.  No ataxia on finger to nose bilaterally. No pronator drift. 5/5 strength throughout. CN 2-12 intact.  Negative Romberg. Equal grip strength. Sensation intact. Gait is normal.   Skin: Skin is warm.  Psychiatric: She has a normal mood and affect. Her behavior is normal.  Nursing note and vitals reviewed.   ED Course  Procedures (including critical care time)  DIAGNOSTIC STUDIES: Oxygen Saturation is 99% on room air, normal by my interpretation.    COORDINATION OF CARE: 10:43 AM - Discussed treatment plan with pt at bedside which includes XR chest, strep test, and UA, and pt agreed to plan.   11:27 AM-Patient informed of negative test results. Discussed treatment plan which includes Tylenol and Motrin at home with pt at bedside and pt agreed to plan.     Labs Review Labs Reviewed  URINALYSIS, ROUTINE W REFLEX MICROSCOPIC - Abnormal; Notable for the following:    Hgb urine dipstick TRACE (*)    All other components within normal limits  RAPID STREP SCREEN  CULTURE, GROUP A STREP  PREGNANCY, URINE  URINE MICROSCOPIC-ADD ON    Imaging Review Dg Chest 2 View  02/08/2014   CLINICAL DATA:  Cough, sore throat, headache and fever for 1 day.  EXAM: CHEST  2 VIEW  COMPARISON:  None.  FINDINGS: The heart size and mediastinal contours are within normal limits. Both lungs are clear. The visualized skeletal structures are unremarkable.  IMPRESSION: Normal chest x-ray.   Electronically Signed   By: Loralie Champagne M.D.   On: 02/08/2014 11:08     EKG Interpretation None      MDM   Final diagnoses:  Cough  Viral syndrome    Six-day history of headache, sore throat, body aches, fever on the first day.  Nonfocal neuro exam. No meningismus.  Rapid strep negative. Chest x-ray negative. Headache improved with toradol in ED.  Discussed oral hydration at home, supportive care for viral syndrome, follow up with PCP.  BP 113/81 mmHg  Pulse 99  Temp(Src) 98.3 F (36.8 C) (Oral)  Resp 20  Ht  (1.651 m)  Wt 130 lb (58.968 kg)  BMI 21.63 kg/m2  SpO2 99%  LMP 02/01/2014   Breastfeeding? Yes  I personally performed the services described in this documentation, which was scribed in my presence. The recorded information has been reviewed and is accurate.    Kimberly Octave, MD 02/08/14 [phone]

## 2014-02-08 NOTE — Discharge Instructions (Signed)

## 2014-02-10 LAB — CULTURE, GROUP A STREP

## 2014-10-08 ENCOUNTER — Encounter: Payer: Self-pay | Admitting: Women's Health

## 2014-10-08 ENCOUNTER — Ambulatory Visit (INDEPENDENT_AMBULATORY_CARE_PROVIDER_SITE_OTHER): Payer: Medicaid Other | Admitting: Women's Health

## 2014-10-08 VITALS — BP 104/60 | HR 68 | Ht 64.25 in | Wt 148.0 lb

## 2014-10-08 DIAGNOSIS — Z01419 Encounter for gynecological examination (general) (routine) without abnormal findings: Secondary | ICD-10-CM

## 2014-10-08 DIAGNOSIS — Z309 Encounter for contraceptive management, unspecified: Secondary | ICD-10-CM

## 2014-10-08 DIAGNOSIS — Z113 Encounter for screening for infections with a predominantly sexual mode of transmission: Secondary | ICD-10-CM

## 2014-10-08 NOTE — Progress Notes (Signed)
Patient ID: Kimberly Ramsey, female   DOB: 08-22-1985, 29 y.o.   MRN: [id number] Subjective:   Kimberly Ramsey is a 29 y.o. G61P3003 African American female here for a routine FP [facility] well-woman exam.  No LMP recorded.   Irregular periods w/ IUD.  Current complaints: none PCP: none       STD screen per [facility]  Social History: Sexual: heterosexual Marital Status: dating Living situation: with daughters Occupation: Health and safety inspector at [employer] Tobacco/alcohol: no tobacco or etoh Illicit drugs: no history of illicit drug use  The following portions of the patient's history were reviewed and updated as appropriate: allergies, current medications, past family history, past medical history, past social history, past surgical history and problem list.  Past Medical History Past Medical History  Diagnosis Date  . Cleft lip and palate   . Thrombocytopenia complicating pregnancy     Past Surgical History Past Surgical History  Procedure Laterality Date  . Cleft palate repair    . Hip surgery      took bone from hip to fix cleft palate.    Gynecologic History G3P3003  No LMP recorded. Contraception: IUD Last Pap: 2014. Results were: normal Last mammogram: never. Results were: n/a Last TCS: never  Obstetric History OB History  Gravida Para Term Preterm AB SAB TAB Ectopic Multiple Living  # Outcome Date GA Lbr Len/2nd Weight Sex Delivery Anes PTL Lv  3 Term 08/17/13 [redacted]w[redacted]d 15:59 / 00:07 7 lb 12 oz (3.515 kg) F Vag-Spont None  Y  2 Term 2013 [redacted]w[redacted]d  7 lb 11 oz (3.487 kg) F Vag-Spont   Y  1 Term 2008 [redacted]w[redacted]d  7 lb 12 oz (3.515 kg) F Vag-Spont   Y      Current Medications Current Outpatient Prescriptions on File Prior to Visit  Medication Sig Dispense Refill  . levonorgestrel (MIRENA) 20 MCG/24HR IUD 1 each by Intrauterine route once.    Marland Kitchen acetaminophen (TYLENOL) 325 MG tablet Take 2 tablets (650 mg total) by mouth every 4 (four) hours as needed for mild pain.  (Patient not taking: Reported on 10/08/2014) 90 tablet 1  . cephALEXin (KEFLEX) 500 MG capsule Take 1 capsule (500 mg total) by mouth 4 (four) times daily. X 7 days (Patient not taking: Reported on 02/08/2014) 28 capsule 0   No current facility-administered medications on file prior to visit.    Review of Systems Patient denies any headaches, blurred vision, shortness of breath, chest pain, abdominal pain, problems with bowel movements, urination, or intercourse.  Objective:  BP 104/60 mmHg  Pulse 68  Ht 5' 4.25" (1.632 m)  Wt 148 lb (67.132 kg)  BMI 25.21 kg/m2  Breastfeeding? No Physical Exam  General:  Well developed, well nourished, no acute distress. She is alert and oriented x3. Skin:  Warm and dry Neck:  Midline trachea, no thyromegaly or nodules Cardiovascular: Regular rate and rhythm, no murmur heard Lungs:  Effort normal, all lung fields clear to auscultation bilaterally Breasts:  No dominant palpable mass, retraction, or nipple discharge Abdomen:  Soft, non tender, no hepatosplenomegaly or masses Pelvic:  External genitalia is normal in appearance.  The vagina is normal in appearance. The cervix is bulbous, no CMT, Mirena strings visible.  Thin prep pap is not done. Uterus is felt to be normal size, shape, and contour.  No adnexal masses or tenderness noted. Extremities:  No swelling or varicosities noted Psych:  She has  a normal mood and affect  Assessment:   Healthy well-woman FP [facility]aid exam  Plan:  GC/CT from urine, HIV, RPR, HSV2 today  F/U 83yr for pap & physical, or sooner if needed Mammogram @29yo  or sooner if problems Colonoscopy @29yo  or sooner if problems  Marge Duncans CNM, WHNP-BC 10/08/2014 12:01 PM

## 2014-10-09 LAB — HSV 2 ANTIBODY, IGG: HSV 2 GLYCOPROTEIN G AB, IGG: 3.73 {index} — AB (ref 0.00–0.90)

## 2014-10-09 LAB — RPR: RPR Ser Ql: NONREACTIVE

## 2014-10-09 LAB — HIV ANTIBODY (ROUTINE TESTING W REFLEX): HIV SCREEN 4TH GENERATION: NONREACTIVE

## 2014-10-10 LAB — GC/CHLAMYDIA PROBE AMP
Chlamydia trachomatis, NAA: NEGATIVE
Neisseria gonorrhoeae by PCR: NEGATIVE

## 2014-10-15 ENCOUNTER — Telehealth: Payer: Self-pay | Admitting: Women's Health

## 2014-10-15 ENCOUNTER — Encounter: Payer: Self-pay | Admitting: Women's Health

## 2014-10-15 DIAGNOSIS — R768 Other specified abnormal immunological findings in serum: Secondary | ICD-10-CM | POA: Insufficient documentation

## 2014-10-15 NOTE — Telephone Encounter (Signed)
Pt notified of labs including +HSV2. Questions answered.  Cheral Marker, CNM, [facility] 10/15/2014 1:09 PM

## 2014-10-15 NOTE — Telephone Encounter (Signed)
LM for pt to return call, need to notify of labs.  Cheral Marker, CNM, [facility] 10/15/2014 9:55 AM

## 2014-10-16 ENCOUNTER — Telehealth: Payer: Self-pay | Admitting: *Deleted

## 2014-10-16 ENCOUNTER — Telehealth: Payer: Self-pay | Admitting: Women's Health

## 2014-10-16 NOTE — Telephone Encounter (Signed)
Pt called with several questions about HSV, all questions answered.

## 2014-10-16 NOTE — Telephone Encounter (Signed)
Pt had questions about HSV, all questions answered.

## 2014-10-23 ENCOUNTER — Telehealth: Payer: Self-pay | Admitting: Women's Health

## 2014-10-23 ENCOUNTER — Other Ambulatory Visit: Payer: Self-pay | Admitting: Women's Health

## 2014-10-23 MED ORDER — VALACYCLOVIR HCL 1 G PO TABS: 2000.0000 mg | ORAL_TABLET | Freq: Two times a day (BID) | ORAL | Status: DC

## 2014-10-23 NOTE — Telephone Encounter (Signed)
Pt states she was returning Kim's call, she had talked to her earlier about a prescription.  Informed her that there was a RX for Valtrex sent to [facility].

## 2014-10-23 NOTE — Progress Notes (Signed)
Pt called, has 2 fever blisters on her lip that just started. Rx valtrex 2gm bid x 1 day w/ 1RF.  Cheral Marker, CNM, [facility] 10/23/2014 9:11 AM

## 2015-01-25 ENCOUNTER — Encounter (HOSPITAL_COMMUNITY): Payer: Self-pay | Admitting: *Deleted

## 2015-01-25 ENCOUNTER — Emergency Department (HOSPITAL_COMMUNITY)
Admission: EM | Admit: 2015-01-25 | Discharge: 2015-01-25 | Disposition: A | Discharge status: Home / Self Care | Payer: PRIVATE HEALTH INSURANCE | Attending: Emergency Medicine | Admitting: Emergency Medicine

## 2015-01-25 DIAGNOSIS — Z792 Long term (current) use of antibiotics: Secondary | ICD-10-CM | POA: Insufficient documentation

## 2015-01-25 DIAGNOSIS — Y999 Unspecified external cause status: Secondary | ICD-10-CM | POA: Diagnosis not present

## 2015-01-25 DIAGNOSIS — Z87730 Personal history of (corrected) cleft lip and palate: Secondary | ICD-10-CM | POA: Diagnosis not present

## 2015-01-25 DIAGNOSIS — S3992XA Unspecified injury of lower back, initial encounter: Secondary | ICD-10-CM | POA: Diagnosis present

## 2015-01-25 DIAGNOSIS — Y9389 Activity, other specified: Secondary | ICD-10-CM | POA: Diagnosis not present

## 2015-01-25 DIAGNOSIS — S39012A Strain of muscle, fascia and tendon of lower back, initial encounter: Secondary | ICD-10-CM | POA: Diagnosis not present

## 2015-01-25 DIAGNOSIS — Y9289 Other specified places as the place of occurrence of the external cause: Secondary | ICD-10-CM | POA: Insufficient documentation

## 2015-01-25 DIAGNOSIS — X500XXA Overexertion from strenuous movement or load, initial encounter: Secondary | ICD-10-CM | POA: Diagnosis not present

## 2015-01-25 MED ORDER — IBUPROFEN 800 MG PO TABS
800.0000 mg | ORAL_TABLET | Freq: Once | ORAL | Status: AC
  Administered 2015-01-25: 800 mg via ORAL
  Filled 2015-01-25: qty 1

## 2015-01-25 MED ORDER — METHOCARBAMOL 500 MG PO TABS: 500.0000 mg | ORAL_TABLET | Freq: Three times a day (TID) | ORAL | Status: DC

## 2015-01-25 MED ORDER — METHOCARBAMOL 500 MG PO TABS
500.0000 mg | ORAL_TABLET | Freq: Once | ORAL | Status: AC
  Administered 2015-01-25: 500 mg via ORAL
  Filled 2015-01-25: qty 1

## 2015-01-25 MED ORDER — IBUPROFEN 800 MG PO TABS: 800.0000 mg | ORAL_TABLET | Freq: Three times a day (TID) | ORAL | Status: DC

## 2015-01-25 NOTE — ED Notes (Signed)
Pt went to push a large cart and the cart didn't move but she felt something pop lower right back and radiated up her back

## 2015-01-25 NOTE — ED Provider Notes (Signed)
CSN: [id number]     Arrival date & time 01/25/15  1749 History   First MD Initiated Contact with Patient 01/25/15 1758     Chief Complaint  Patient presents with  . 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  Pt states that while she was attempting to push a heavy food cart she felt a "pop" followed by a sharp pain to her right low back.  Injury occurred just prior to ED arrival.   Pain is worse with movement, improves at rest.  Pain radiates toward her right mid back.  She denies numbness or weakness of the upper or lower extremities, abd pain, fall, urine or bowel changes.  She has not tried any therapies or medications prior to arrival.     Past Medical History  Diagnosis Date  . Cleft lip and palate   . Thrombocytopenia complicating pregnancy [facility](HCC)    Past Surgical History  Procedure Laterality Date  . Cleft palate repair    . Hip surgery      took bone from hip to fix cleft palate.   Family History  Problem Relation Age of Onset  . Mental illness Mother   . Schizophrenia Mother   . Lupus Father    Social History  Substance Use Topics  . Smoking status: Never Smoker   . Smokeless tobacco: Never Used  . Alcohol Use: No   OB History    Gravida Para Term Preterm AB TAB SAB Ectopic Multiple Living   3 3 3       3      Review of Systems  Constitutional: Negative for fever.  Respiratory: Negative for shortness of breath.   Gastrointestinal: Negative for vomiting, abdominal pain and constipation.  Genitourinary: Negative for dysuria, hematuria, flank pain, decreased urine volume and difficulty urinating.  Musculoskeletal: Positive for back pain. Negative for joint swelling.  Skin: Negative for rash.   Neurological: Negative for weakness and numbness.  All other systems reviewed and are negative.     Allergies  Codeine  Home Medications   Prior to Admission medications   Medication Sig Start Date End Date Taking? Authorizing Provider  acetaminophen (TYLENOL) 325 MG tablet Take 2 tablets (650 mg total) by mouth every 4 (four) hours as needed for mild pain. Patient not taking: Reported on 10/08/2014 08/19/13   Minta BalsamMichael R Odom, MD  cephALEXin (KEFLEX) 500 MG capsule Take 1 capsule (500 mg total) by mouth 4 (four) times daily. X 7 days Patient not taking: Reported on 02/08/2014 09/19/13   Cheral MarkerKimberly R Booker, CNM  levonorgestrel (MIRENA) 20 MCG/24HR IUD 1 each by Intrauterine route once.    Historical Provider, MD  valACYclovir (VALTREX) 1000 MG tablet Take 2 tablets (2,000 mg total) by mouth 2 (two) times daily. X 1 day 10/23/14   Cheral MarkerKimberly R Booker, CNM   BP 119/90 mmHg  Pulse 82  Temp(Src) 98.1 F (36.7 C) (Oral)  Resp 18  Ht 5\' 5"  (1.651 m)  Wt 58.968 kg  BMI 21.63 kg/m2  SpO2 100%  LMP 01/20/2015   Physical Exam  Constitutional: She is oriented to person, place, and time. She appears well-developed and well-nourished. No distress.  HENT:  Head: Normocephalic and atraumatic.  Neck: Normal range of motion. Neck supple.  Cardiovascular: Normal rate, regular rhythm, normal heart sounds and intact distal pulses.   No murmur heard. Pulmonary/Chest: Effort normal and breath sounds normal. No respiratory distress. She exhibits no tenderness.  Abdominal: Soft. She exhibits no distension. There is no tenderness.  Musculoskeletal: She exhibits tenderness. She exhibits no edema.       Lumbar back: She exhibits tenderness and pain. She exhibits normal range of motion, no swelling, no deformity, no laceration and normal pulse.  Localized ttp of the right lumbar paraspinal muscles.  No spinal tenderness.  DP pulses are brisk and symmetrical.  Distal sensation intact.  Hip Flexors/Extensors are  intact.  Pt has 5/5 strength against resistance of bilateral upper and lower extremities.     Neurological: She is alert and oriented to person, place, and time. She has normal strength. No sensory deficit. She exhibits normal muscle tone. Coordination and gait normal.  Reflex Scores:      Patellar reflexes are 2+ on the right side and 2+ on the left side.      Achilles reflexes are 2+ on the right side and 2+ on the left side. Skin: Skin is warm and dry. No rash noted.  Nursing note and vitals reviewed.   ED Course  Procedures (including critical care time) Labs Review Labs Reviewed - No data to display  Imaging Review No results found. I have personally reviewed and evaluated these images and lab results as part of my medical decision-making.    MDM   Final diagnoses:  Lumbar strain, initial encounter    Pt is well appearing, vitals stable, ambulates with a steady gait.  No focal neuro deficits.  No concerning sx's for emergent neurological or infectious process.  Likely musculoskeletal injury.  No reported trauma to indicate need for imaging at this time.  Pt agrees to symptomatic tx and close PMD f/u if needed.  Appears stable for d/c    Pauline Aus, PA-C 01/25/15 1824  Samuel Jester, DO 01/27/15 0001

## 2015-01-25 NOTE — Discharge Instructions (Signed)
Muscle Strain °A muscle strain (pulled muscle) happens when a muscle is stretched beyond normal length. It happens when a sudden, violent force stretches your muscle too far. Usually, a few of the fibers in your muscle are torn. Muscle strain is common in athletes. Recovery usually takes 1-2 weeks. Complete healing takes 5-6 weeks.  °HOME CARE  °· Follow the PRICE method of treatment to help your injury get better. Do this the first 2-3 days after the injury: °¨ Protect. Protect the muscle to keep it from getting injured again. °¨ Rest. Limit your activity and rest the injured body part. °¨ Ice. Put ice in a plastic bag. Place a towel between your skin and the bag. Then, apply the ice and leave it on from 15-20 minutes each hour. After the third day, switch to moist heat packs. °¨ Compression. Use a splint or elastic bandage on the injured area for comfort. Do not put it on too tightly. °¨ Elevate. Keep the injured body part above the level of your heart. °· Only take medicine as told by your doctor. °· Warm up before doing exercise to prevent future muscle strains. °GET HELP IF:  °· You have more pain or puffiness (swelling) in the injured area. °· You feel numbness, tingling, or notice a loss of strength in the injured area. °MAKE SURE YOU:  °· Understand these instructions. °· Will watch your condition. °· Will get help right away if you are not doing well or get worse. °  °This information is not intended to replace advice given to you by your health care provider. Make sure you discuss any questions you have with your health care provider. °  °Document Released: 11/04/2007 Document Revised: 11/15/2012 Document Reviewed: 08/24/2012 °Elsevier Interactive Patient Education ©2016 Elsevier Inc. ° °

## 2015-03-04 DIAGNOSIS — Z0389 Encounter for observation for other suspected diseases and conditions ruled out: Secondary | ICD-10-CM | POA: Diagnosis not present

## 2015-03-04 DIAGNOSIS — Z0000 Encounter for general adult medical examination without abnormal findings: Secondary | ICD-10-CM | POA: Diagnosis not present

## 2015-03-04 DIAGNOSIS — R5383 Other fatigue: Secondary | ICD-10-CM | POA: Diagnosis not present

## 2015-03-04 DIAGNOSIS — Z13220 Encounter for screening for lipoid disorders: Secondary | ICD-10-CM | POA: Diagnosis not present

## 2015-03-04 DIAGNOSIS — E559 Vitamin D deficiency, unspecified: Secondary | ICD-10-CM | POA: Diagnosis not present

## 2015-03-19 DIAGNOSIS — E559 Vitamin D deficiency, unspecified: Secondary | ICD-10-CM | POA: Diagnosis not present

## 2015-06-12 DIAGNOSIS — E785 Hyperlipidemia, unspecified: Secondary | ICD-10-CM | POA: Diagnosis not present

## 2015-06-12 DIAGNOSIS — E559 Vitamin D deficiency, unspecified: Secondary | ICD-10-CM | POA: Diagnosis not present

## 2015-06-12 DIAGNOSIS — R5383 Other fatigue: Secondary | ICD-10-CM | POA: Diagnosis not present

## 2015-06-12 DIAGNOSIS — R51 Headache: Secondary | ICD-10-CM | POA: Diagnosis not present

## 2015-10-14 ENCOUNTER — Ambulatory Visit (INDEPENDENT_AMBULATORY_CARE_PROVIDER_SITE_OTHER): Payer: Medicaid Other | Admitting: Women's Health

## 2015-10-14 ENCOUNTER — Other Ambulatory Visit (HOSPITAL_COMMUNITY)
Admission: RE | Admit: 2015-10-14 | Discharge: 2015-10-14 | Disposition: A | Discharge status: Home / Self Care | Payer: 59 | Source: Ambulatory Visit | Attending: Obstetrics & Gynecology | Admitting: Obstetrics & Gynecology

## 2015-10-14 ENCOUNTER — Encounter: Payer: Self-pay | Admitting: Women's Health

## 2015-10-14 VITALS — BP 118/82 | HR 68 | Ht 64.25 in | Wt 146.0 lb

## 2015-10-14 DIAGNOSIS — Z3049 Encounter for surveillance of other contraceptives: Secondary | ICD-10-CM | POA: Diagnosis not present

## 2015-10-14 DIAGNOSIS — N6489 Other specified disorders of breast: Secondary | ICD-10-CM | POA: Diagnosis not present

## 2015-10-14 DIAGNOSIS — Z01419 Encounter for gynecological examination (general) (routine) without abnormal findings: Secondary | ICD-10-CM | POA: Insufficient documentation

## 2015-10-14 DIAGNOSIS — Z113 Encounter for screening for infections with a predominantly sexual mode of transmission: Secondary | ICD-10-CM

## 2015-10-14 DIAGNOSIS — N6310 Unspecified lump in the right breast, unspecified quadrant: Secondary | ICD-10-CM | POA: Insufficient documentation
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
## 2015-10-15 LAB — CYTOLOGY - PAP

## 2015-10-15 LAB — HIV ANTIBODY (ROUTINE TESTING W REFLEX): HIV SCREEN 4TH GENERATION: NONREACTIVE

## 2015-10-15 LAB — RPR: RPR Ser Ql: NONREACTIVE

## 2015-10-21 ENCOUNTER — Ambulatory Visit (HOSPITAL_COMMUNITY)
Admission: RE | Admit: 2015-10-21 | Discharge: 2015-10-21 | Disposition: A | Discharge status: Home / Self Care | Payer: 59 | Source: Ambulatory Visit | Attending: Women's Health | Admitting: Women's Health

## 2015-10-21 ENCOUNTER — Ambulatory Visit (HOSPITAL_COMMUNITY): Payer: 59

## 2015-10-21 DIAGNOSIS — N63 Unspecified lump in breast: Secondary | ICD-10-CM | POA: Insufficient documentation

## 2015-10-21 DIAGNOSIS — N6310 Unspecified lump in the right breast, unspecified quadrant: Secondary | ICD-10-CM

## 2016-03-04 DIAGNOSIS — Z0389 Encounter for observation for other suspected diseases and conditions ruled out: Secondary | ICD-10-CM | POA: Diagnosis not present

## 2016-03-04 DIAGNOSIS — E559 Vitamin D deficiency, unspecified: Secondary | ICD-10-CM | POA: Diagnosis not present

## 2016-03-04 DIAGNOSIS — R030 Elevated blood-pressure reading, without diagnosis of hypertension: Secondary | ICD-10-CM | POA: Diagnosis not present

## 2016-03-04 DIAGNOSIS — Z0000 Encounter for general adult medical examination without abnormal findings: Secondary | ICD-10-CM | POA: Diagnosis not present

## 2016-03-17 DIAGNOSIS — E559 Vitamin D deficiency, unspecified: Secondary | ICD-10-CM | POA: Diagnosis not present

## 2016-03-17 DIAGNOSIS — R030 Elevated blood-pressure reading, without diagnosis of hypertension: Secondary | ICD-10-CM | POA: Diagnosis not present

## 2016-03-17 DIAGNOSIS — R946 Abnormal results of thyroid function studies: Secondary | ICD-10-CM | POA: Diagnosis not present

## 2016-04-20 ENCOUNTER — Ambulatory Visit: Payer: Medicaid Other | Admitting: "Endocrinology

## 2016-05-04 ENCOUNTER — Encounter: Payer: Self-pay | Admitting: "Endocrinology

## 2016-05-04 ENCOUNTER — Ambulatory Visit (INDEPENDENT_AMBULATORY_CARE_PROVIDER_SITE_OTHER): Payer: 59 | Admitting: "Endocrinology

## 2016-05-04 VITALS — BP 114/74 | HR 92 | Ht 64.25 in | Wt 151.0 lb

## 2016-05-04 DIAGNOSIS — R7989 Other specified abnormal findings of blood chemistry: Secondary | ICD-10-CM

## 2016-05-04 DIAGNOSIS — R946 Abnormal results of thyroid function studies: Secondary | ICD-10-CM | POA: Diagnosis not present

## 2016-05-04 LAB — TSH

## 2016-05-04 LAB — T4, FREE: Free T4: 1.2 ng/dL (ref 0.8–1.8)

## 2016-05-04 NOTE — Progress Notes (Signed)
Subjective:    Patient ID: Kimberly Ramsey, female    DOB: 03/20/1985, PCP Kimberly SingerGOSRANI,NIMISH C, MD   Past Medical History:  Diagnosis Date  . Cleft lip and palate   . Thrombocytopenia complicating pregnancy [facility](HCC)    Past Surgical History:  Procedure Laterality Date  . CLEFT PALATE REPAIR    . HIP SURGERY     took bone from hip to fix cleft palate.   Social History   Social History  . Marital status: Single    Spouse name: N/A  . Number of children: N/A  . Years of education: N/A   Social History Main Topics  . Smoking status: Never Smoker  . Smokeless tobacco: Never Used  . Alcohol use No  . Drug use: No  . Sexual activity: Not Currently    Birth control/ protection: None   Other Topics Concern  . Not on file   Social History Narrative  . No narrative on file   Outpatient Encounter Prescriptions as of 05/04/2016  Medication Sig  . [DISCONTINUED] ibuprofen (ADVIL,MOTRIN) 800 MG tablet Take 1 tablet (800 mg total) by mouth 3 (three) times daily. Take with food (Patient not taking: Reported on 10/14/2015)  . [DISCONTINUED] levonorgestrel (MIRENA) 20 MCG/24HR IUD 1 each by Intrauterine route once.  . [DISCONTINUED] methocarbamol (ROBAXIN) 500 MG tablet Take 1 tablet (500 mg total) by mouth 3 (three) times daily. (Patient not taking: Reported on 10/14/2015)  . [DISCONTINUED] valACYclovir (VALTREX) 1000 MG tablet Take 2 tablets (2,000 mg total) by mouth 2 (two) times daily. X 1 day   No facility-administered encounter medications on file as of 05/04/2016.    ALLERGIES: Allergies  Allergen Reactions  . Codeine Nausea And Vomiting    VACCINATION STATUS: Immunization History  Administered Date(s) Administered  . Tdap 08/18/2013    HPI  31 year old female patient with medical history as above. She is being seen in consultation for abnormal thyroid function test requested by Dr. Karilyn CotaGosrani. - She is verbally reporting that she was told she may have overactive thyroid from  [name]' disease. Her labs are not available to review today. She denies recent weight loss, palpitations, heat intolerance, tremors. She has normal menstrual cycles, mother of 3 children. She denies any family history of thyroid dysfunction. - She denies dysphagia, shortness of breath, voice change.   Review of Systems  Constitutional: no weight gain/loss, no fatigue, no subjective hyperthermia, no subjective hypothermia Eyes: no blurry vision, no xerophthalmia ENT: no sore throat, no nodules palpated in throat, no dysphagia/odynophagia, no hoarseness Cardiovascular: no Chest Pain, no Shortness of Breath, no palpitations, no leg swelling Respiratory: no cough, no SOB Gastrointestinal: no Nausea/Vomiting/Diarhhea Musculoskeletal: no muscle/joint aches Skin: no rashes Neurological: no tremors, no numbness, no tingling, no dizziness Psychiatric: no depression, no anxiety  Objective:    BP 114/74   Pulse 92   Ht 5' 4.25" (1.632 m)   Wt 151 lb (68.5 kg)   BMI 25.72 kg/m   Wt Readings from Last 3 Encounters:  05/04/16 151 lb (68.5 kg)  10/14/15 146 lb (66.2 kg)  01/25/15 130 lb (59 kg)    Physical Exam  Constitutional: Appropriate weight for hight, not in acute distress, normal state of mind Eyes: PERRLA, EOMI, no exophthalmos ENT:  + Repaired cleft lip,  moist mucous membranes, no thyromegaly, no cervical lymphadenopathy Cardiovascular: normal precordial activity, Regular Rate and Rhythm, no Murmur/Rubs/Gallops Respiratory:  adequate breathing efforts, no gross chest deformity, Clear to auscultation bilaterally Gastrointestinal: abdomen soft, Non -  tender, No distension, Bowel Sounds present Musculoskeletal: no gross deformities, strength intact in all four extremities Skin: moist, warm, no rashes Neurological: no tremor with outstretched hands, Deep tendon reflexes normal in all four extremities.   CMP     Component Value Date/Time   NA 135 01/28/2013 2234   K 3.4 (L)  01/28/2013 2234   CL 96 01/28/2013 2234   CO2 23 01/28/2013 2234   GLUCOSE 76 01/28/2013 2234   BUN 11 01/28/2013 2234   CREATININE 0.50 01/28/2013 2234   CALCIUM 10.0 01/28/2013 2234   GFRNONAA >90 01/28/2013 2234   GFRAA >90 01/28/2013 2234   No available thyroid function test to review.   Assessment & Plan:   1. Abnormal thyroid blood test - Patient is being seen at the kind request of Dr. Karilyn Cota. - She is not particularly symptomatic of hyperthyroidism at this time, and her labs suggesting hyperthyroidism are not available to review today. -We will request for a copy of her prior labs. - I will proceed to obtain new set of thyroid function tests including TSH/free T4, and antithyroid antibodies. She will return in 1 week for reevaluation for treatment decision if necessary.  - I advised patient to maintain close follow up with Kimberly Singer, MD for primary care needs. Follow up plan: Return in about 1 week (around 05/11/2016) for labs today.  Marquis Lunch, MD Phone: [phone]  Fax: [phone]   05/04/2016, 11:19 AM

## 2016-05-05 LAB — THYROGLOBULIN ANTIBODY: Thyroglobulin Ab: 2 IU/mL — ABNORMAL HIGH (ref <2)

## 2016-05-05 LAB — THYROID PEROXIDASE ANTIBODY: Thyroperoxidase Ab SerPl-aCnc: 155 IU/mL — ABNORMAL HIGH (ref <9)

## 2016-05-14 ENCOUNTER — Ambulatory Visit (INDEPENDENT_AMBULATORY_CARE_PROVIDER_SITE_OTHER): Payer: 59 | Admitting: "Endocrinology

## 2016-05-14 ENCOUNTER — Encounter: Payer: Self-pay | Admitting: "Endocrinology

## 2016-05-14 VITALS — BP 124/76 | HR 84 | Ht 64.25 in | Wt 151.0 lb

## 2016-05-14 DIAGNOSIS — E0590 Thyrotoxicosis, unspecified without thyrotoxic crisis or storm: Secondary | ICD-10-CM | POA: Diagnosis not present

## 2016-05-14 NOTE — Progress Notes (Signed)
Subjective:    Patient ID: Kimberly Ramsey, female    DOB: 1985-08-10, PCP Wilson Singer, MD   Past Medical History:  Diagnosis Date  . Cleft lip and palate   . Thrombocytopenia complicating pregnancy [facility])    Past Surgical History:  Procedure Laterality Date  . CLEFT PALATE REPAIR    . HIP SURGERY     took bone from hip to fix cleft palate.   Social History   Social History  . Marital status: Single    Spouse name: N/A  . Number of children: N/A  . Years of education: N/A   Social History Main Topics  . Smoking status: Never Smoker  . Smokeless tobacco: Never Used  . Alcohol use No  . Drug use: No  . Sexual activity: Not Currently    Birth control/ protection: None   Other Topics Concern  . None   Social History Narrative  . None   No outpatient encounter prescriptions on file as of 05/14/2016.   No facility-administered encounter medications on file as of 05/14/2016.    ALLERGIES: Allergies  Allergen Reactions  . Codeine Nausea And Vomiting    VACCINATION STATUS: Immunization History  Administered Date(s) Administered  . Tdap 08/18/2013    HPI  31 year old female patient with medical history as above. She is being seen in f/u for abnormal thyroid function test requested by Dr. Karilyn Cota. - She was sent for thyroid function tests during her last visit. She returns to discuss her results. - She denies recent weight loss, palpitations, heat intolerance, tremors. She has normal menstrual cycles, mother of 3 children. She denies any family history of thyroid dysfunction. - She denies dysphagia, shortness of breath, voice change.   Review of Systems  Constitutional: no weight gain/loss, no fatigue, no subjective hyperthermia, no subjective hypothermia Eyes: no blurry vision, no xerophthalmia ENT: no sore throat, no nodules palpated in throat, no dysphagia/odynophagia, no hoarseness Cardiovascular: no Chest Pain, no Shortness of Breath, no  palpitations, no leg swelling Respiratory: no cough, no SOB Gastrointestinal: no Nausea/Vomiting/Diarhhea Musculoskeletal: no muscle/joint aches Skin: no rashes Neurological: no tremors, no numbness, no tingling, no dizziness Psychiatric: no depression, no anxiety  Objective:    BP 124/76   Pulse 84   Ht 5' 4.25" (1.632 m)   Wt 151 lb (68.5 kg)   BMI 25.72 kg/m   Wt Readings from Last 3 Encounters:  05/14/16 151 lb (68.5 kg)  05/04/16 151 lb (68.5 kg)  10/14/15 146 lb (66.2 kg)    Physical Exam  Constitutional: Appropriate weight for hight, not in acute distress, normal state of mind Eyes: PERRLA, EOMI, no exophthalmos ENT:  + Repaired cleft lip,  moist mucous membranes, no thyromegaly, no cervical lymphadenopathy Cardiovascular: normal precordial activity, Regular Rate and Rhythm, no Murmur/Rubs/Gallops Respiratory:  adequate breathing efforts, no gross chest deformity, Clear to auscultation bilaterally Gastrointestinal: abdomen soft, Non -tender, No distension, Bowel Sounds present Musculoskeletal: no gross deformities, strength intact in all four extremities Skin: moist, warm, no rashes Neurological: no tremor with outstretched hands, Deep tendon reflexes normal in all four extremities.   CMP     Component Value Date/Time   NA 135 01/28/2013 2234   K 3.4 (L) 01/28/2013 2234   CL 96 01/28/2013 2234   CO2 23 01/28/2013 2234   GLUCOSE 76 01/28/2013 2234   BUN 11 01/28/2013 2234   CREATININE 0.50 01/28/2013 2234   CALCIUM 10.0 01/28/2013 2234   GFRNONAA >90 01/28/2013 2234   GFRAA >  90 01/28/2013 2234   No available thyroid function test to review.   Assessment & Plan:   1. Hyperthyroidism 2. Elevated antithyroid autoantibodies - I have discussed her repeat labs with her. 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 She will need confirmatory thyroid uptake and scan which will be done as soon as possible. - If her scan results agree with her lab work,  she will be considered for I-131 thyroid ablation. She will return in 1 week for reevaluation.  - I advised patient to maintain close follow up with Wilson Singer, MD for primary care needs. Follow up plan: Return in about 1 week (around 05/21/2016) for thyroid uptake and scan.  Marquis Lunch, MD Phone: [phone]  Fax: [phone]   05/14/2016, 11:21 AM

## 2016-05-21 ENCOUNTER — Ambulatory Visit: Payer: 59 | Admitting: "Endocrinology

## 2016-05-26 ENCOUNTER — Encounter (HOSPITAL_COMMUNITY): Admission: RE | Admit: 2016-05-26 | Payer: 59 | Source: Ambulatory Visit

## 2016-05-27 ENCOUNTER — Encounter (HOSPITAL_COMMUNITY): Payer: 59

## 2016-05-31 ENCOUNTER — Encounter: Payer: Self-pay | Admitting: Women's Health

## 2016-05-31 ENCOUNTER — Ambulatory Visit (INDEPENDENT_AMBULATORY_CARE_PROVIDER_SITE_OTHER): Payer: 59 | Admitting: Women's Health

## 2016-05-31 VITALS — BP 122/70 | HR 68 | Ht 65.0 in | Wt 151.0 lb

## 2016-05-31 DIAGNOSIS — B9689 Other specified bacterial agents as the cause of diseases classified elsewhere: Secondary | ICD-10-CM

## 2016-05-31 DIAGNOSIS — N760 Acute vaginitis: Secondary | ICD-10-CM | POA: Diagnosis not present

## 2016-05-31 DIAGNOSIS — Z113 Encounter for screening for infections with a predominantly sexual mode of transmission: Secondary | ICD-10-CM

## 2016-05-31 LAB — POCT WET PREP (WET MOUNT)
Clue Cells Wet Prep Whiff POC: POSITIVE
TRICHOMONAS WET PREP HPF POC: ABSENT

## 2016-05-31 MED ORDER — METRONIDAZOLE 500 MG PO TABS: 500.0000 mg | ORAL_TABLET | Freq: Two times a day (BID) | ORAL | 0 refills | Status: DC

## 2016-05-31 NOTE — Progress Notes (Signed)
   Family [facility] Clinic Visit  Patient name: Kimberly Ramsey MRN [id number]  Date of birth: [birth year]  CC & HPI:  Kimberly Ramsey is a 31 y.o. G24P3003 African American female presenting today for Mirena IUD removal. Just recently dx w/ hyperthyroidism and low Vit D levels. States she was told by MD that it could be from IUD. Also has occ headaches, discussed this could potentially be r/t IUD. Mirena was placed 10/03/13. No problems w/ IUD, would like to keep if possible. Felt like strings were a little longer recently. Had period w/ bad cramps this month. +d/c w/ odor, itching/irritation. Wants STD screen, thinks partner may be cheating on her.  No LMP recorded. Patient is not currently having periods (Reason: IUD). The current method of family planning is IUD. Last pap 10/14/15, neg  Pertinent History Reviewed:  Medical & Surgical Hx:   Past medical, surgical, family, and social history reviewed in electronic medical record Medications: Reviewed & Updated - see associated section Allergies: Reviewed in electronic medical record  Objective Findings:  Vitals: BP 122/70 (BP Location: Right Arm, Patient Position: Sitting, Cuff Size: Normal)   Pulse 68   Ht  (1.651 m)   Wt 151 lb (68.5 kg)   BMI 25.13 kg/m  Body mass index is 25.13 kg/m.  Physical Examination: General appearance - alert, well appearing, and in no distress Pelvic - IUD strings normal length, tucked behind cx @ 10 o'clock, no part of IUD visible White thin malodorous d/c  Results for orders placed or performed in visit on 05/31/16 (from the past 24 hour(s))  POCT Wet Prep Mellody Drown [place])   Collection Time: 05/31/16  2:27 PM  Result Value Ref Range   Source Wet Prep POC vaginal    WBC, Wet Prep HPF POC few    Bacteria Wet Prep HPF POC None (A) Few   BACTERIA WET PREP MORPHOLOGY POC     Clue Cells Wet Prep HPF POC Many (A) None   Clue Cells Wet Prep Whiff POC Positive Whiff    Yeast Wet Prep HPF POC None    KOH Wet  Prep POC     Trichomonas Wet Prep HPF POC Absent Absent     Assessment & Plan:  A:   BV  Recent dx hyperthyroidism/low Vit D  IUD not removed  STD screen  P:  Rx metronidazole  BID x 7d for BV, no sex or etoh while taking   Discussed per Up to Date and discussion w/ Dr. Emelda Fear, Mirena IUD has no correlation w/ thyroid imbalances or Vit D levels- pt wishes to leave IUD in  GC/CT, HIV, RPR today  Return for after 9/5 for physical.  Marge Duncans CNM, [facility] 05/31/2016 2:27 PM

## 2016-05-31 NOTE — Patient Instructions (Signed)
Bacterial Vaginosis Bacterial vaginosis is a vaginal infection that occurs when the normal balance of bacteria in the vagina is disrupted. It results from an overgrowth of certain bacteria. This is the most common vaginal infection among women ages 15-44. Because bacterial vaginosis increases your risk for STIs (sexually transmitted infections), getting treated can help reduce your risk for chlamydia, gonorrhea, herpes, and HIV (human immunodeficiency virus). Treatment is also important for preventing complications in pregnant women, because this condition can cause an early (premature) delivery. What are the causes? This condition is caused by an increase in harmful bacteria that are normally present in small amounts in the vagina. However, the reason that the condition develops is not fully understood. What increases the risk? The following factors may make you more likely to develop this condition:  Having a new sexual partner or multiple sexual partners.  Having unprotected sex.  Douching.  Having an intrauterine device (IUD).  Smoking.  Drug and alcohol abuse.  Taking certain antibiotic medicines.  Being pregnant.  You cannot get bacterial vaginosis from toilet seats, bedding, swimming pools, or contact with objects around you. What are the signs or symptoms? Symptoms of this condition include:  Grey or white vaginal discharge. The discharge can also be watery or foamy.  A fish-like odor with discharge, especially after sexual intercourse or during menstruation.  Itching in and around the vagina.  Burning or pain with urination.  Some women with bacterial vaginosis have no signs or symptoms. How is this diagnosed? This condition is diagnosed based on:  Your medical history.  A physical exam of the vagina.  Testing a sample of vaginal fluid under a microscope to look for a large amount of bad bacteria or abnormal cells. Your health care provider may use a cotton swab  or a small wooden spatula to collect the sample.  How is this treated? This condition is treated with antibiotics. These may be given as a pill, a vaginal cream, or a medicine that is put into the vagina (suppository). If the condition comes back after treatment, a second round of antibiotics may be needed. Follow these instructions at home: Medicines  Take over-the-counter and prescription medicines only as told by your health care provider.  Take or use your antibiotic as told by your health care provider. Do not stop taking or using the antibiotic even if you start to feel better. General instructions  If you have a female sexual partner, tell her that you have a vaginal infection. She should see her health care provider and be treated if she has symptoms. If you have a female sexual partner, he does not need treatment.  During treatment: ? Avoid sexual activity until you finish treatment. ? Do not douche. ? Avoid alcohol as directed by your health care provider. ? Avoid breastfeeding as directed by your health care provider.  Drink enough water and fluids to keep your urine clear or pale yellow.  Keep the area around your vagina and rectum clean. ? Wash the area daily with warm water. ? Wipe yourself from front to back after using the toilet.  Keep all follow-up visits as told by your health care provider. This is important. How is this prevented?  Do not douche.  Wash the outside of your vagina with warm water only.  Use protection when having sex. This includes latex condoms and dental dams.  Limit how many sexual partners you have. To help prevent bacterial vaginosis, it is best to have sex with just   one partner (monogamous).  Make sure you and your sexual partner are tested for STIs.  Wear cotton or cotton-lined underwear.  Avoid wearing tight pants and pantyhose, especially during summer.  Limit the amount of alcohol that you drink.  Do not use any products that  contain nicotine or tobacco, such as cigarettes and e-cigarettes. If you need help quitting, ask your health care provider.  Do not use illegal drugs. Where to find more information:  Centers for Disease Control and Prevention: www.cdc.gov/std  American Sexual Health Association (ASHA): www.ashastd.org  U.S. Department of Health and Human Services, Office on Women's Health: www.womenshealth.gov/ or https://www.womenshealth.gov/a-z-topics/bacterial-vaginosis Contact a health care provider if:  Your symptoms do not improve, even after treatment.  You have more discharge or pain when urinating.  You have a fever.  You have pain in your abdomen.  You have pain during sex.  You have vaginal bleeding between periods. Summary  Bacterial vaginosis is a vaginal infection that occurs when the normal balance of bacteria in the vagina is disrupted.  Because bacterial vaginosis increases your risk for STIs (sexually transmitted infections), getting treated can help reduce your risk for chlamydia, gonorrhea, herpes, and HIV (human immunodeficiency virus). Treatment is also important for preventing complications in pregnant women, because the condition can cause an early (premature) delivery.  This condition is treated with antibiotic medicines. These may be given as a pill, a vaginal cream, or a medicine that is put into the vagina (suppository). This information is not intended to replace advice given to you by your health care provider. Make sure you discuss any questions you have with your health care provider. Document Released: 01/25/2005 Document Revised: 10/11/2015 Document Reviewed: 10/11/2015 Elsevier Interactive Patient Education  2017 [employer]vier Inc.  

## 2016-06-01 LAB — HIV ANTIBODY (ROUTINE TESTING W REFLEX): HIV Screen 4th Generation wRfx: NONREACTIVE

## 2016-06-01 LAB — RPR: RPR Ser Ql: NONREACTIVE

## 2016-06-02 LAB — GC/CHLAMYDIA PROBE AMP
Chlamydia trachomatis, NAA: NEGATIVE
Neisseria gonorrhoeae by PCR: NEGATIVE

## 2016-06-10 ENCOUNTER — Telehealth: Payer: Self-pay

## 2016-06-10 NOTE — Telephone Encounter (Signed)
Pt notified of NM appt 

## 2016-06-21 DIAGNOSIS — E559 Vitamin D deficiency, unspecified: Secondary | ICD-10-CM | POA: Diagnosis not present

## 2016-06-21 DIAGNOSIS — E785 Hyperlipidemia, unspecified: Secondary | ICD-10-CM | POA: Diagnosis not present

## 2016-06-21 DIAGNOSIS — R946 Abnormal results of thyroid function studies: Secondary | ICD-10-CM | POA: Diagnosis not present

## 2016-06-21 DIAGNOSIS — R030 Elevated blood-pressure reading, without diagnosis of hypertension: Secondary | ICD-10-CM | POA: Diagnosis not present

## 2016-06-23 ENCOUNTER — Encounter (HOSPITAL_COMMUNITY): Admission: RE | Admit: 2016-06-23 | Payer: 59 | Source: Ambulatory Visit

## 2016-06-24 ENCOUNTER — Encounter (HOSPITAL_COMMUNITY): Payer: 59

## 2016-07-02 ENCOUNTER — Ambulatory Visit: Payer: 59 | Admitting: "Endocrinology

## 2016-07-02 ENCOUNTER — Encounter: Payer: Self-pay | Admitting: "Endocrinology

## 2016-08-05 ENCOUNTER — Other Ambulatory Visit: Payer: Self-pay | Admitting: Women's Health

## 2016-08-05 DIAGNOSIS — E782 Mixed hyperlipidemia: Secondary | ICD-10-CM | POA: Diagnosis not present

## 2016-08-05 DIAGNOSIS — I10 Essential (primary) hypertension: Secondary | ICD-10-CM | POA: Diagnosis not present

## 2016-08-05 DIAGNOSIS — D509 Iron deficiency anemia, unspecified: Secondary | ICD-10-CM | POA: Diagnosis not present

## 2016-08-05 DIAGNOSIS — E039 Hypothyroidism, unspecified: Secondary | ICD-10-CM | POA: Diagnosis not present

## 2016-08-10 ENCOUNTER — Other Ambulatory Visit: Payer: Self-pay | Admitting: Women's Health

## 2016-08-13 ENCOUNTER — Ambulatory Visit (INDEPENDENT_AMBULATORY_CARE_PROVIDER_SITE_OTHER): Payer: 59 | Admitting: Women's Health

## 2016-08-13 ENCOUNTER — Encounter: Payer: Self-pay | Admitting: Women's Health

## 2016-08-13 VITALS — BP 110/60 | HR 76 | Wt 154.0 lb

## 2016-08-13 DIAGNOSIS — Z113 Encounter for screening for infections with a predominantly sexual mode of transmission: Secondary | ICD-10-CM

## 2016-08-13 DIAGNOSIS — N760 Acute vaginitis: Secondary | ICD-10-CM | POA: Diagnosis not present

## 2016-08-13 DIAGNOSIS — B9689 Other specified bacterial agents as the cause of diseases classified elsewhere: Secondary | ICD-10-CM | POA: Diagnosis not present

## 2016-08-13 DIAGNOSIS — N898 Other specified noninflammatory disorders of vagina: Secondary | ICD-10-CM

## 2016-08-13 LAB — POCT WET PREP (WET MOUNT)
Clue Cells Wet Prep Whiff POC: POSITIVE
TRICHOMONAS WET PREP HPF POC: ABSENT

## 2016-08-13 MED ORDER — FLUCONAZOLE 150 MG PO TABS: 150.0000 mg | ORAL_TABLET | Freq: Once | ORAL | 0 refills | Status: AC

## 2016-08-13 NOTE — Progress Notes (Signed)
   [facility] Visit  Patient name: Kimberly Ramsey [place] MRN [id number]  Date of birth: 03/24/1985  CC & HPI:  Kimberly Ramsey Rasor is a 31 y.o. 553P3003 African American female presenting today for report of thick white vaginal d/c w/ vulvar itching. No odor. Wants to be checked for gc/ct as well.  Patient's last menstrual period was 07/26/2016. The current method of family planning is IUD. Last pap 10/14/15, neg  Pertinent History Reviewed:  Medical & Surgical Hx:   Past medical, surgical, family, and social history reviewed in electronic medical record Medications: Reviewed & Updated - see associated section Allergies: Reviewed in electronic medical record  Objective Findings:  Vitals: BP 110/60   Pulse 76   Wt 154 lb (69.9 kg)   LMP 07/26/2016   BMI 25.63 kg/m  Body mass index is 25.63 kg/m.  Physical Examination: General appearance - alert, well appearing, and in no distress Pelvic - cx appears normal, IUD strings visible/appropriate length, mod amt thick white slightly malodorous d/c  Results for orders placed or performed in visit on 08/13/16 (from the past 24 hour(s))  POCT Wet Prep Mellody Drown([place])   Collection Time: 08/13/16 12:43 PM  Result Value Ref Range   Source Wet Prep POC vaginal    WBC, Wet Prep HPF POC none    Bacteria Wet Prep HPF POC None (A) Few   BACTERIA WET PREP MORPHOLOGY POC     Clue Cells Wet Prep HPF POC Many (A) None   Clue Cells Wet Prep Whiff POC Positive Whiff    Yeast Wet Prep HPF POC None    KOH Wet Prep POC     Trichomonas Wet Prep HPF POC Absent Absent     Assessment & Plan:  A:   BV  STD check  P:  Rx metronidazole 500mg  BID x 7d for BV, no sex or etoh while taking   Rx diflucan in case still itching after metronidazole as d/c had thicker appearance more c/w yeast than BV, although slide was + BV  GC/CT today  Return for after 9/5 for , Pap & physical.  Marge DuncansBooker, [place] Andy Randall CNM, [facility] 08/13/2016 12:43 PM

## 2016-08-13 NOTE — Addendum Note (Signed)
Addended by: Federico FlakeNES, PEGGY A on: 08/13/2016 12:47 PM   Modules accepted: Orders

## 2016-08-15 LAB — GC/CHLAMYDIA PROBE AMP
CHLAMYDIA, DNA PROBE: NEGATIVE
NEISSERIA GONORRHOEAE BY PCR: NEGATIVE

## 2016-09-15 ENCOUNTER — Emergency Department (HOSPITAL_COMMUNITY): Payer: 59

## 2016-09-15 ENCOUNTER — Emergency Department (HOSPITAL_COMMUNITY)
Admission: EM | Admit: 2016-09-15 | Discharge: 2016-09-15 | Disposition: A | Discharge status: Home / Self Care | Payer: 59 | Attending: Emergency Medicine | Admitting: Emergency Medicine

## 2016-09-15 ENCOUNTER — Encounter (HOSPITAL_COMMUNITY): Payer: Self-pay | Admitting: Emergency Medicine

## 2016-09-15 DIAGNOSIS — E039 Hypothyroidism, unspecified: Secondary | ICD-10-CM | POA: Diagnosis not present

## 2016-09-15 DIAGNOSIS — R0602 Shortness of breath: Secondary | ICD-10-CM | POA: Insufficient documentation

## 2016-09-15 DIAGNOSIS — Z87730 Personal history of (corrected) cleft lip and palate: Secondary | ICD-10-CM | POA: Insufficient documentation

## 2016-09-15 DIAGNOSIS — Z79899 Other long term (current) drug therapy: Secondary | ICD-10-CM | POA: Diagnosis not present

## 2016-09-15 DIAGNOSIS — R079 Chest pain, unspecified: Secondary | ICD-10-CM | POA: Diagnosis not present

## 2016-09-15 DIAGNOSIS — R1011 Right upper quadrant pain: Secondary | ICD-10-CM | POA: Diagnosis not present

## 2016-09-15 LAB — CBC
HEMATOCRIT: 46.7 % — AB (ref 36.0–46.0)
Hemoglobin: 15.9 g/dL — ABNORMAL HIGH (ref 12.0–15.0)
MCH: 28.8 pg (ref 26.0–34.0)
MCHC: 34 g/dL (ref 30.0–36.0)
MCV: 84.6 fL (ref 78.0–100.0)
Platelets: 163 10*3/uL (ref 150–400)
RBC: 5.52 MIL/uL — ABNORMAL HIGH (ref 3.87–5.11)
RDW: 14.6 % (ref 11.5–15.5)
WBC: 6.3 10*3/uL (ref 4.0–10.5)

## 2016-09-15 LAB — COMPREHENSIVE METABOLIC PANEL
ALK PHOS: 109 U/L (ref 38–126)
ALT: 16 U/L (ref 14–54)
AST: 18 U/L (ref 15–41)
Albumin: 4.4 g/dL (ref 3.5–5.0)
Anion gap: 7 (ref 5–15)
BILIRUBIN TOTAL: 0.6 mg/dL (ref 0.3–1.2)
BUN: 12 mg/dL (ref 6–20)
CALCIUM: 9.6 mg/dL (ref 8.9–10.3)
CHLORIDE: 104 mmol/L (ref 101–111)
CO2: 30 mmol/L (ref 22–32)
CREATININE: 0.78 mg/dL (ref 0.44–1.00)
Glucose, Bld: 87 mg/dL (ref 65–99)
Potassium: 3.9 mmol/L (ref 3.5–5.1)
Sodium: 141 mmol/L (ref 135–145)
TOTAL PROTEIN: 8.2 g/dL — AB (ref 6.5–8.1)

## 2016-09-15 LAB — TROPONIN I: Troponin I: <0.03 ng/mL (ref <0.03)

## 2016-09-15 NOTE — ED Triage Notes (Signed)
Pt c/o sudden onset of sob, chest pain and abd pain.

## 2016-09-15 NOTE — Discharge Instructions (Signed)
Return here tomorrow for ultrasound.  Bland diet.

## 2016-09-15 NOTE — ED Provider Notes (Signed)
[facility]-EMER[facility]ENCY DEPT Provider Note   CSN: [id number] Arrival date & time: 09/15/16  4098     History   Chief Complaint Chief Complaint  Patient presents with  . Shortness of Breath    HPI Kimberly Ramsey is a 31 y.o. female.  The history is provided by the patient. No language interpreter was used.  Shortness of Breath  This is a new problem. The average episode lasts 2 hours. The problem occurs continuously.The current episode started 3 to 5 hours ago. The problem has been resolved. Pertinent negatives include no fever. It is unknown what precipitated the problem. She has tried nothing for the symptoms. The treatment provided no relief. She has had no prior hospitalizations. She has had no prior ICU admissions. Associated medical issues do not include asthma, PE or past MI.  Pt reports she ate at [employer].  Pt reports she began having severe pain in right upper abdomen about an hour afterwards.  Pt reports pain was severe and went through to her back.  Pt reports pain made it hard to breath.  Pt reports all symptoms have resolved now.   Past Medical History:  Diagnosis Date  . Cleft lip and palate   . Hypothyroid   . Thrombocytopenia complicating pregnancy [facility])     Patient Active Problem List   Diagnosis Date Noted  . Hyperthyroidism 05/14/2016  . Breast mass, right 10/14/2015  . HSV-2 seropositive 10/15/2014  . Encounter for insertion of mirena IUD 10/03/2013  . Down syndrome in child of prior pregnancy, currently pregnant 01/16/2013  . H/O cleft lip/palate 01/16/2013    Past Surgical History:  Procedure Laterality Date  . CLEFT PALATE REPAIR    . HIP SURGERY     took bone from hip to fix cleft palate.    OB History    Gravida Para Term Preterm AB Living   3 3 3     3    SAB TAB Ectopic Multiple Live Births           3       Home Medications    Prior to Admission medications   Medication Sig Start Date End Date Taking? Authorizing Provider  Vitamin D,  Ergocalciferol, (DRISDOL) 50000 units CAPS capsule Take 50,000 Units by mouth every 7 (seven) days.    [provider]    Family History Family History  Problem Relation Age of Onset  . Mental illness Mother   . Schizophrenia Mother   . Lupus Father   . Colon cancer Father     Social History Social History  Substance Use Topics  . Smoking status: Never Smoker  . Smokeless tobacco: Never Used  . Alcohol use No     Allergies   Codeine   Review of Systems Review of Systems  Constitutional: Negative for fever.  Respiratory: Positive for shortness of breath.   All other systems reviewed and are negative.    Physical Exam Updated Vital Signs BP 116/80   Pulse 71   Temp 97.9 F (36.6 C) (Oral)   Resp (!) 25   Ht 5\' 5"  (1.651 m)   Wt 59 kg (130 lb)   LMP 09/08/2016   SpO2 100%   BMI 21.63 kg/m   Physical Exam  Constitutional: She appears well-developed and well-nourished. No distress.  HENT:  Head: Normocephalic and atraumatic.  Eyes: Conjunctivae are normal.  Neck: Neck supple.  Cardiovascular: Normal rate and regular rhythm.   No murmur heard. Pulmonary/Chest: Effort normal and breath  sounds normal. No respiratory distress.  Abdominal: Soft. There is no tenderness.  Musculoskeletal: She exhibits no edema.  Neurological: She is alert.  Skin: Skin is warm and dry.  Psychiatric: She has a normal mood and affect.  Nursing note and vitals reviewed.    ED Treatments / Results  Labs (all labs ordered are listed, but only abnormal results are displayed) Labs Reviewed  CBC - Abnormal; Notable for the following:       Result Value   RBC 5.52 (*)    Hemoglobin 15.9 (*)    HCT 46.7 (*)    All other components within normal limits  COMPREHENSIVE METABOLIC PANEL - Abnormal; Notable for the following:    Total Protein 8.2 (*)    All other components within normal limits  TROPONIN I  URINALYSIS, ROUTINE W REFLEX MICROSCOPIC  PREGNANCY, URINE     EKG  EKG Interpretation  Date/Time:  Wednesday September 15 2016 19:24:24 EDT Ventricular Rate:  98 PR Interval:  118 QRS Duration: 84 QT Interval:  354 QTC Calculation: 451 R Axis:   66 Text Interpretation:  Normal sinus rhythm with sinus arrhythmia Septal infarct , age undetermined Abnormal ECG No old tracing to compare Confirmed by Mancel BaleWentz, Elliott [phone]([id number]) on 09/15/2016 11:15:50 PM       Radiology Dg Chest 2 View  Result Date: 09/15/2016 CLINICAL DATA:  Chest pain and shortness of breath EXAM: CHEST  2 VIEW COMPARISON:  Chest radiograph 02/08/2014 FINDINGS: The heart size and mediastinal contours are within normal limits. Both lungs are clear. The visualized skeletal structures are unremarkable. IMPRESSION: Normal chest. Electronically Signed   By: Deatra RobinsonKevin  Herman M.D.   On: 09/15/2016 19:55    Procedures Procedures (including critical care time)  Medications Ordered in ED Medications - No data to display   Initial Impression / Assessment and Plan / ED Course  I have reviewed the triage vital signs and the nursing notes.  Pertinent labs & imaging results that were available during my care of the patient were reviewed by me and considered in my medical decision making (see chart for details).     Pt unable to obtain urine.  I suspect pt had a gallbladder attack, given location and radiation.  Pt counseled on results..  Pt is agreeable to returning here tomorrow am for gb ultrasound.  Pt counseled on bland diet and need to return if symptoms return.  Final Clinical Impressions(s) / ED Diagnoses   Final diagnoses:  Right upper quadrant abdominal pain    New Prescriptions Discharge Medication List as of 09/15/2016 10:13 PM    An After Visit Summary was printed and given to the patient.   Elson AreasSofia, Lorena Benham K, PA-C 09/15/16 2349    Mancel BaleWentz, Elliott, MD 09/16/16 1255

## 2016-09-15 NOTE — ED Notes (Signed)
Pt states she doesn't need to urinate at this time

## 2016-09-29 ENCOUNTER — Encounter (HOSPITAL_COMMUNITY): Payer: Self-pay

## 2016-09-29 ENCOUNTER — Encounter (HOSPITAL_COMMUNITY)
Admission: RE | Admit: 2016-09-29 | Discharge: 2016-09-29 | Disposition: A | Discharge status: Home / Self Care | Payer: 59 | Source: Ambulatory Visit | Attending: "Endocrinology | Admitting: "Endocrinology

## 2016-09-29 DIAGNOSIS — E0590 Thyrotoxicosis, unspecified without thyrotoxic crisis or storm: Secondary | ICD-10-CM | POA: Insufficient documentation

## 2016-09-29 MED ORDER — SODIUM IODIDE I-123 7.4 MBQ CAPS
400.0000 | ORAL_CAPSULE | Freq: Once | ORAL | Status: AC
  Administered 2016-09-29: 388 via ORAL

## 2016-09-30 ENCOUNTER — Encounter (HOSPITAL_COMMUNITY)
Admission: RE | Admit: 2016-09-30 | Discharge: 2016-09-30 | Disposition: A | Discharge status: Home / Self Care | Payer: 59 | Source: Ambulatory Visit | Attending: "Endocrinology | Admitting: "Endocrinology

## 2016-09-30 DIAGNOSIS — E031 Congenital hypothyroidism without goiter: Secondary | ICD-10-CM | POA: Diagnosis not present

## 2016-09-30 DIAGNOSIS — E0590 Thyrotoxicosis, unspecified without thyrotoxic crisis or storm: Secondary | ICD-10-CM | POA: Diagnosis not present

## 2016-10-03 IMAGING — US ULTRASOUND RIGHT BREAST LIMITED
1 series · 2 of 2 positions shown · non-contrast
Comparison: None.

CLINICAL DATA: 30-year-old female presenting for evaluation of a
palpable area of concern identified by her clinician.

EXAM:
ULTRASOUND OF THE RIGHT BREAST

[Series 1: ultrasound right breast limited · 0.07mm/px · 2 of 2 slices shown]
[im 1/2]
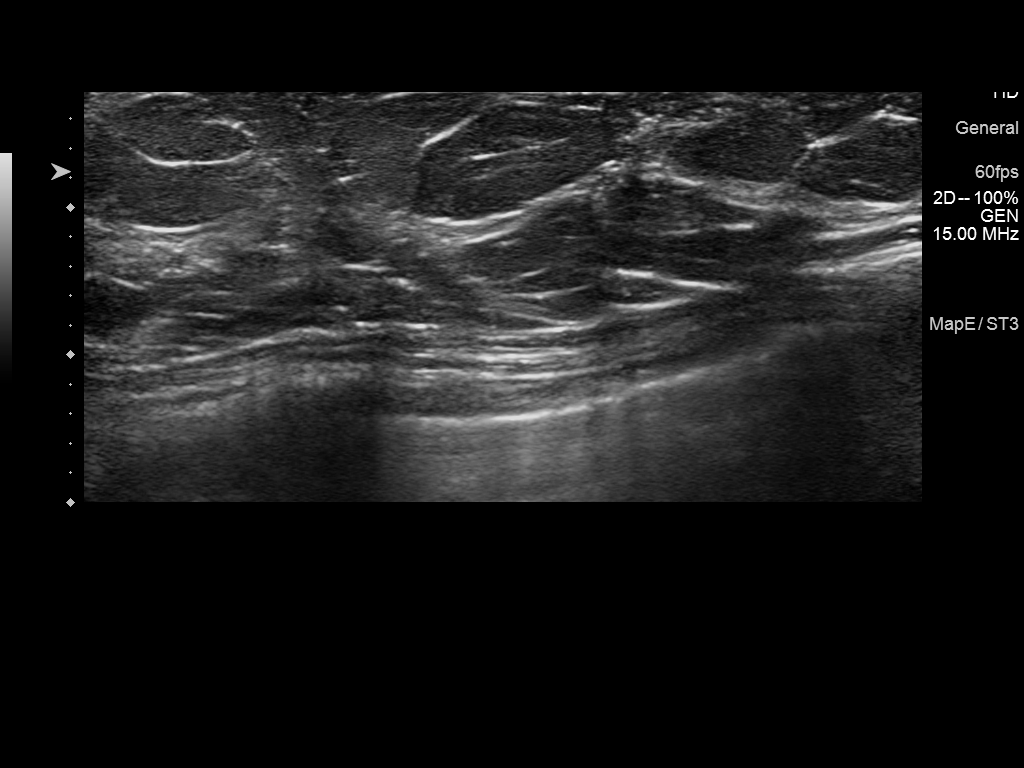
[im 2/2]
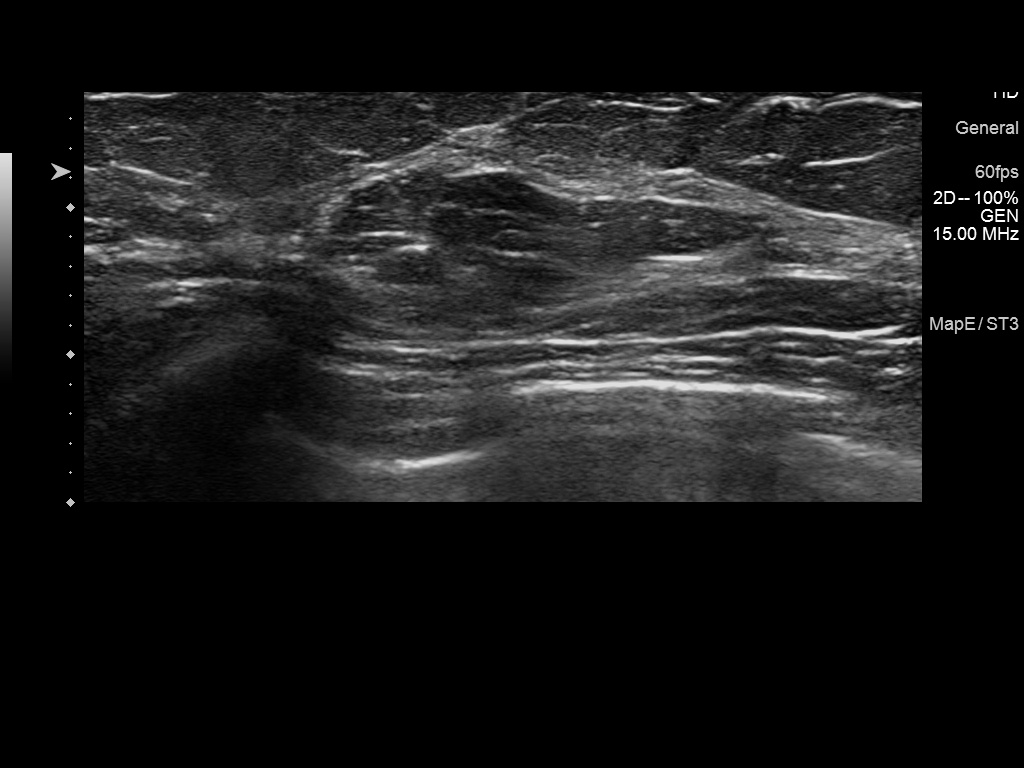

[2 of 2 positions shown; findings below may reference images not displayed]

FINDINGS: On physical exam, a firm ridge of tissue without discrete palpable
mass is identified in the lower-inner quadrant of the right breast.

Targeted ultrasound is performed, showing normal fibroglandular
tissue. No masses or suspicious areas of shadowing are identified at
the site of palpable concern in the lower inner right breast. A
representative image at the 5 o'clock position was acquired.
IMPRESSION: No targeted sonographic correlate for the palpable area of concern
in the lower inner right breast. Normal targeted right breast
ultrasound.

RECOMMENDATION:
1. Clinical follow-up recommended for the palpable area of concern
in the right breast. Any further workup should be based on clinical
grounds.

2. Screening mammogram at age 40 unless there are persistent or
intervening clinical concerns. (Code:[id number])

I have discussed the findings and recommendations with the patient.
Results were also provided in writing at the conclusion of the
visit. If applicable, a reminder letter will be sent to the patient
regarding the next appointment.

BI-RADS CATEGORY  1: Negative

## 2016-10-06 ENCOUNTER — Ambulatory Visit (INDEPENDENT_AMBULATORY_CARE_PROVIDER_SITE_OTHER): Payer: 59 | Admitting: "Endocrinology

## 2016-10-06 ENCOUNTER — Encounter: Payer: Self-pay | Admitting: "Endocrinology

## 2016-10-06 VITALS — BP 115/78 | HR 78 | Ht 64.25 in | Wt 151.0 lb

## 2016-10-06 DIAGNOSIS — E559 Vitamin D deficiency, unspecified: Secondary | ICD-10-CM | POA: Diagnosis not present

## 2016-10-06 DIAGNOSIS — E063 Autoimmune thyroiditis: Secondary | ICD-10-CM | POA: Insufficient documentation

## 2016-10-06 DIAGNOSIS — R946 Abnormal results of thyroid function studies: Secondary | ICD-10-CM | POA: Diagnosis not present

## 2016-10-06 NOTE — Progress Notes (Signed)
Subjective:    Patient ID: Kimberly Ramsey, female    DOB: 11/07/1985, PCP Wilson SingerGosrani, Nimish C, MD   Past Medical History:  Diagnosis Date  . Cleft lip and palate   . Hypothyroid   . Thrombocytopenia complicating pregnancy [facility](HCC)    Past Surgical History:  Procedure Laterality Date  . CLEFT PALATE REPAIR    . HIP SURGERY     took bone from hip to fix cleft palate.   Social History   Social History  . Marital status: Single    Spouse name: N/A  . Number of children: N/A  . Years of education: N/A   Social History Main Topics  . Smoking status: Never Smoker  . Smokeless tobacco: Never Used  . Alcohol use No  . Drug use: No  . Sexual activity: Yes    Birth control/ protection: None, IUD   Other Topics Concern  . None   Social History Narrative  . None   Outpatient Encounter Prescriptions as of 10/06/2016  Medication Sig  . Vitamin D, Ergocalciferol, (DRISDOL) 50000 units CAPS capsule Take 50,000 Units by mouth every 7 (seven) days.   No facility-administered encounter medications on file as of 10/06/2016.    ALLERGIES: Allergies  Allergen Reactions  . Codeine Nausea And Vomiting    VACCINATION STATUS: Immunization History  Administered Date(s) Administered  . Tdap 08/18/2013    HPI  31 year old female patient with medical history as above. She is being seen in f/u for abnormal thyroid function test  with thyroid uptake and scan.  - She was supposed to return in April 2018, for unclear reasons her thyroid uptake and scan was delayed. -She did not have thyroid function tests since March 2018. - Patient continues to feel better. - She denies recent weight loss, palpitations, heat intolerance, tremors. She has normal menstrual cycles, mother of 3 children. She denies any family history of thyroid dysfunction. - She denies dysphagia, shortness of breath, voice change.   Review of Systems  Constitutional: + steady weight , no fatigue, no subjective  hyperthermia, no subjective hypothermia Eyes: no blurry vision, no xerophthalmia ENT: no sore throat, no nodules palpated in throat, no dysphagia/odynophagia, no hoarseness Cardiovascular: no Chest Pain, no Shortness of Breath, no palpitations, no leg swelling Respiratory: no cough, no SOB Gastrointestinal: no Nausea/Vomiting/Diarhhea Musculoskeletal: no muscle/joint aches Skin: no rashes Neurological: no tremors, no numbness, no tingling, no dizziness Psychiatric: no depression, no anxiety  Objective:    BP 115/78   Pulse 78   Ht 5' 4.25" (1.632 m)   Wt 151 lb (68.5 kg)   LMP 09/13/2016   BMI 25.72 kg/m   Wt Readings from Last 3 Encounters:  10/06/16 151 lb (68.5 kg)  09/15/16 130 lb (59 kg)  08/13/16 154 lb (69.9 kg)    Physical Exam  Constitutional: Appropriate weight for hight, not in acute distress, normal state of mind Eyes: PERRLA, EOMI, no exophthalmos ENT:  + Repaired cleft lip,  moist mucous membranes, no thyromegaly, no cervical lymphadenopathy Cardiovascular: normal precordial activity, Regular Rate and Rhythm, no Murmur/Rubs/Gallops Respiratory:  adequate breathing efforts, no gross chest deformity, Clear to auscultation bilaterally Gastrointestinal: abdomen soft, Non -tender, No distension, Bowel Sounds present Musculoskeletal: no gross deformities, strength intact in all four extremities Skin: moist, warm, no rashes Neurological: no tremor with outstretched hands, Deep tendon reflexes normal in all four extremities.   CMP     Component Value Date/Time   NA 141 09/15/2016 2040   K  3.9 09/15/2016 2040   CL 104 09/15/2016 2040   CO2 30 09/15/2016 2040   GLUCOSE 87 09/15/2016 2040   BUN 12 09/15/2016 2040   CREATININE 0.78 09/15/2016 2040   CALCIUM 9.6 09/15/2016 2040   PROT 8.2 (H) 09/15/2016 2040   ALBUMIN 4.4 09/15/2016 2040   AST 18 09/15/2016 2040   ALT 16 09/15/2016 2040   ALKPHOS 109 09/15/2016 2040   BILITOT 0.6 09/15/2016 2040   GFRNONAA >60  09/15/2016 2040   GFRAA >60 09/15/2016 2040   Results for Kimberly Ramsey, Kimberly Ramsey (MRN [id number]) as of 10/06/2016 14:06  Ref. Range 05/04/2016 11:32  TSH Latest Units: mIU/L <0.01 (L)  T4,Free(Direct) Latest Ref Range: 0.8 - 1.8 ng/dL 1.2  Thyroglobulin Ab Latest Ref Range: <2 IU/mL 2 (H)  Thyroperoxidase Ab SerPl-aCnc Latest Ref Range: <9 IU/mL 155 (H)   On 09/30/2016 she underwent thyroid uptake and scan which showed 26% (normal 10-30 percent) uniform uptake with no nodularity.  Assessment & Plan:    Elevated antithyroid autoantibodies - I have discussed her repeat labs with her, as well as her thyroid uptake and scan. She is clinically euthyroid. - Based on these reviews and her clinical response,  she will not need antithyroid intervention at this time .  - Due to elevated antithyroid antibodies, she has future risk of thyroid dysfunction.  - I discussed the need for continued observation  with her, next thyroid function tests will be in 6 month with office visit .  - I advised patient to maintain close follow up with Wilson Singer, MD for primary care needs. Follow up plan: Return in about 6 months (around 04/07/2017) for follow up with pre-visit labs.  Marquis Lunch, MD Phone: [phone]  Fax: [phone]   10/06/2016, 2:12 PM

## 2016-10-21 ENCOUNTER — Other Ambulatory Visit: Payer: 59 | Admitting: Women's Health

## 2016-10-28 ENCOUNTER — Other Ambulatory Visit: Payer: 59 | Admitting: Women's Health

## 2016-11-01 ENCOUNTER — Ambulatory Visit (INDEPENDENT_AMBULATORY_CARE_PROVIDER_SITE_OTHER): Payer: 59 | Admitting: Women's Health

## 2016-11-01 ENCOUNTER — Encounter: Payer: Self-pay | Admitting: Women's Health

## 2016-11-01 VITALS — BP 100/70 | HR 64 | Ht 65.0 in | Wt 153.0 lb

## 2016-11-01 DIAGNOSIS — Z113 Encounter for screening for infections with a predominantly sexual mode of transmission: Secondary | ICD-10-CM

## 2016-11-01 DIAGNOSIS — N898 Other specified noninflammatory disorders of vagina: Secondary | ICD-10-CM

## 2016-11-01 DIAGNOSIS — N6310 Unspecified lump in the right breast, unspecified quadrant: Secondary | ICD-10-CM

## 2016-11-01 DIAGNOSIS — Z01419 Encounter for gynecological examination (general) (routine) without abnormal findings: Secondary | ICD-10-CM

## 2016-11-01 DIAGNOSIS — Z01411 Encounter for gynecological examination (general) (routine) with abnormal findings: Secondary | ICD-10-CM

## 2016-11-01 LAB — POCT WET PREP (WET MOUNT)
Clue Cells Wet Prep Whiff POC: POSITIVE
Trichomonas Wet Prep HPF POC: ABSENT

## 2016-11-01 MED ORDER — METRONIDAZOLE 500 MG PO TABS: 500.0000 mg | ORAL_TABLET | Freq: Two times a day (BID) | ORAL | 0 refills | Status: DC

## 2016-11-01 NOTE — Patient Instructions (Signed)
Constipation  Drink plenty of fluid, preferably water, throughout the day  Eat foods high in fiber such as fruits, vegetables, and grains  Exercise, such as walking, is a good way to keep your bowels regular  Drink warm fluids, especially warm prune juice, or decaf coffee  Eat a 1/2 cup of real oatmeal (not instant), 1/2 cup applesauce, and 1/2-1 cup warm prune juice every day  If needed, you may take Colace (docusate sodium) stool softener once or twice a day to help keep the stool soft. If you are pregnant, wait until you are out of your first trimester (12-14 weeks of pregnancy)  If you still are having problems with constipation, you may take Miralax once daily as needed to help keep your bowels regular.  If you are pregnant, wait until you are out of your first trimester (12-14 weeks of pregnancy)     Bacterial Vaginosis Bacterial vaginosis is a vaginal infection that occurs when the normal balance of bacteria in the vagina is disrupted. It results from an overgrowth of certain bacteria. This is the most common vaginal infection among women ages 92-44. Because bacterial vaginosis increases your risk for STIs (sexually transmitted infections), getting treated can help reduce your risk for chlamydia, gonorrhea, herpes, and HIV (human immunodeficiency virus). Treatment is also important for preventing complications in pregnant women, because this condition can cause an early (premature) delivery. What are the causes? This condition is caused by an increase in harmful bacteria that are normally present in small amounts in the vagina. However, the reason that the condition develops is not fully understood. What increases the risk? The following factors may make you more likely to develop this condition:  Having a new sexual partner or multiple sexual partners.  Having unprotected sex.  Douching.  Having an intrauterine device (IUD).  Smoking.  Drug and alcohol abuse.  Taking  certain antibiotic medicines.  Being pregnant.  You cannot get bacterial vaginosis from toilet seats, bedding, swimming pools, or contact with objects around you. What are the signs or symptoms? Symptoms of this condition include:  Grey or white vaginal discharge. The discharge can also be watery or foamy.  A fish-like odor with discharge, especially after sexual intercourse or during menstruation.  Itching in and around the vagina.  Burning or pain with urination.  Some women with bacterial vaginosis have no signs or symptoms. How is this diagnosed? This condition is diagnosed based on:  Your medical history.  A physical exam of the vagina.  Testing a sample of vaginal fluid under a microscope to look for a large amount of bad bacteria or abnormal cells. Your health care provider may use a cotton swab or a small wooden spatula to collect the sample.  How is this treated? This condition is treated with antibiotics. These may be given as a pill, a vaginal cream, or a medicine that is put into the vagina (suppository). If the condition comes back after treatment, a second round of antibiotics may be needed. Follow these instructions at home: Medicines  Take over-the-counter and prescription medicines only as told by your health care provider.  Take or use your antibiotic as told by your health care provider. Do not stop taking or using the antibiotic even if you start to feel better. General instructions  If you have a female sexual partner, tell her that you have a vaginal infection. She should see her health care provider and be treated if she has symptoms. If you have a female  sexual partner, he does not need treatment.  During treatment: ? Avoid sexual activity until you finish treatment. ? Do not douche. ? Avoid alcohol as directed by your health care provider. ? Avoid breastfeeding as directed by your health care provider.  Drink enough water and fluids to keep your  urine clear or pale yellow.  Keep the area around your vagina and rectum clean. ? Wash the area daily with warm water. ? Wipe yourself from front to back after using the toilet.  Keep all follow-up visits as told by your health care provider. This is important. How is this prevented?  Do not douche.  Wash the outside of your vagina with warm water only.  Use protection when having sex. This includes latex condoms and dental dams.  Limit how many sexual partners you have. To help prevent bacterial vaginosis, it is best to have sex with just one partner (monogamous).  Make sure you and your sexual partner are tested for STIs.  Wear cotton or cotton-lined underwear.  Avoid wearing tight pants and pantyhose, especially during summer.  Limit the amount of alcohol that you drink.  Do not use any products that contain nicotine or tobacco, such as cigarettes and e-cigarettes. If you need help quitting, ask your health care provider.  Do not use illegal drugs. Where to find more information:  Centers for Disease Control and Prevention: SolutionApps.co.za  American Sexual Health Association (ASHA): www.ashastd.org  U.S. Department of Health and Health and safety inspector, Office on Women's Health: ConventionalMedicines.si or http://www.anderson-williamson.info/ Contact a health care provider if:  Your symptoms do not improve, even after treatment.  You have more discharge or pain when urinating.  You have a fever.  You have pain in your abdomen.  You have pain during sex.  You have vaginal bleeding between periods. Summary  Bacterial vaginosis is a vaginal infection that occurs when the normal balance of bacteria in the vagina is disrupted.  Because bacterial vaginosis increases your risk for STIs (sexually transmitted infections), getting treated can help reduce your risk for chlamydia, gonorrhea, herpes, and HIV (human immunodeficiency virus). Treatment is also  important for preventing complications in pregnant women, because the condition can cause an early (premature) delivery.  This condition is treated with antibiotic medicines. These may be given as a pill, a vaginal cream, or a medicine that is put into the vagina (suppository). This information is not intended to replace advice given to you by your health care provider. Make sure you discuss any questions you have with your health care provider. Document Released: 01/25/2005 Document Revised: 10/11/2015 Document Reviewed: 10/11/2015 Elsevier Interactive Patient Education  2017 [employer].

## 2016-11-01 NOTE — Progress Notes (Addendum)
Subjective:   Kimberly Ramsey is a 31 y.o. G59P3003 African American female here for a routine well-woman exam.  No LMP recorded. Patient is not currently having periods (Reason: IUD).    Current complaints: vaginal odor with some irritation last few days, hasn't tried anything to improve.  Hemorrhoids when constipated or eats something spicy, improves w/ preparation H PCP: Dr. Karilyn Cota       Does desire STD labs  Social History: Sexual: heterosexual Marital Status: single, taking a break from her boyfriend of 63yrs Living situation: alone w/ her children Occupation: dietician at [employer] Tobacco/alcohol: none Illicit drugs: no history of illicit drug use  The following portions of the patient's history were reviewed and updated as appropriate: allergies, current medications, past family history, past medical history, past social history, past surgical history and problem list.  Past Medical History Past Medical History:  Diagnosis Date  . Cleft lip and palate   . Hypothyroid   . Thrombocytopenia complicating pregnancy [facility])     Past Surgical History Past Surgical History:  Procedure Laterality Date  . CLEFT PALATE REPAIR    . HIP SURGERY     took bone from hip to fix cleft palate.    Gynecologic History G3P3003  No LMP recorded. Patient is not currently having periods (Reason: IUD). Contraception: IUD Last Pap: 10/14/15. Results were: normal Last mammogram: never. Results were: n/a; did have breast u/s last year for Rt breast mass, normal, pt unable to feel this area Last TCS: never  Obstetric History OB History  Gravida Para Term Preterm AB Living  SAB TAB Ectopic Multiple Live Births          3    # Outcome Date GA Lbr Len/2nd Weight Sex Delivery Anes PTL Lv  3 Term 08/17/13 [redacted]w[redacted]d 15:59 / 00:07 7 lb 12 oz (3.515 kg) F Vag-Spont None  LIV     Birth Comments: No problems at birth  2 Term 2013 [redacted]w[redacted]d  7 lb 11 oz (3.487 kg) F Vag-Spont   LIV  1 Term 2008  [redacted]w[redacted]d  7 lb 12 oz (3.515 kg) F Vag-Spont   LIV      Current Medications Current Outpatient Prescriptions on File Prior to Visit  Medication Sig Dispense Refill  . Vitamin D, Ergocalciferol, (DRISDOL) 50000 units CAPS capsule Take 50,000 Units by mouth every 7 (seven) days.     No current facility-administered medications on file prior to visit.     Review of Systems Patient denies any headaches, blurred vision, shortness of breath, chest pain, abdominal pain, problems with bowel movements, urination, or intercourse.  Objective:  BP 100/70 (BP Location: Right Arm, Patient Position: Sitting, Cuff Size: Normal)   Pulse 64   Ht  (1.651 m)   Wt 153 lb (69.4 kg)   BMI 25.46 kg/m  Physical Exam  General:  Well developed, well nourished, no acute distress. She is alert and oriented x3. Skin:  Warm and dry Neck:  Midline trachea, no thyromegaly or nodules Cardiovascular: Regular rate and rhythm, no murmur heard Lungs:  Effort normal, all lung fields clear to auscultation bilaterally Breasts:  No dominant palpable mass, retraction, or nipple discharge Abdomen:  Soft, non tender, no hepatosplenomegaly or masses Pelvic:  External genitalia is normal in appearance.  The vagina is normal in appearance. +white thin malodorous d/c.  The cervix is bulbous, no CMT, IUD strings present.  Thin prep pap is not done. Uterus  is felt to be normal size, shape, and contour.  No adnexal masses or tenderness noted Extremities:  No swelling or varicosities noted Psych:  She has a normal mood and affect  Assessment:   Healthy well-woman exam BV STD screen Constipation Hemorrhoids  Plan:  GC/CT from urine, HIV, RPR, HepB today Rx metronidazole  BID x 7d for BV, no sex or etoh while taking  Gave printed info on constipation prevention/relief Continue preparation H for hemorrhoids, let us know if stops helping F/U 1yr for physical, or sooner if needed Mammogram @ 31yo or sooner if  problems Colonoscopy @ 31yo or sooner if problems  Marge Duncans CNM, [facility] 11/01/2016 10:46 AM

## 2016-11-02 LAB — HIV ANTIBODY (ROUTINE TESTING W REFLEX): HIV Screen 4th Generation wRfx: NONREACTIVE

## 2016-11-02 LAB — RPR: RPR Ser Ql: NONREACTIVE

## 2016-11-02 LAB — HEPATITIS B SURFACE ANTIGEN: Hepatitis B Surface Ag: NEGATIVE

## 2016-11-08 LAB — GC/CHLAMYDIA PROBE AMP
Chlamydia trachomatis, NAA: NEGATIVE
NEISSERIA GONORRHOEAE BY PCR: NEGATIVE

## 2016-11-10 DIAGNOSIS — R946 Abnormal results of thyroid function studies: Secondary | ICD-10-CM | POA: Diagnosis not present

## 2016-11-10 DIAGNOSIS — E559 Vitamin D deficiency, unspecified: Secondary | ICD-10-CM | POA: Diagnosis not present

## 2016-11-10 DIAGNOSIS — R030 Elevated blood-pressure reading, without diagnosis of hypertension: Secondary | ICD-10-CM | POA: Diagnosis not present

## 2017-03-16 DIAGNOSIS — E559 Vitamin D deficiency, unspecified: Secondary | ICD-10-CM | POA: Diagnosis not present

## 2017-03-16 DIAGNOSIS — R946 Abnormal results of thyroid function studies: Secondary | ICD-10-CM | POA: Diagnosis not present

## 2017-03-16 DIAGNOSIS — R5383 Other fatigue: Secondary | ICD-10-CM | POA: Diagnosis not present

## 2017-03-16 DIAGNOSIS — Z0000 Encounter for general adult medical examination without abnormal findings: Secondary | ICD-10-CM | POA: Diagnosis not present

## 2017-03-16 DIAGNOSIS — E785 Hyperlipidemia, unspecified: Secondary | ICD-10-CM | POA: Diagnosis not present

## 2017-03-16 LAB — LIPID PANEL
Cholesterol: 146 (ref 0–200)
HDL: 54 (ref 35–70)
LDL CALC: 75
TRIGLYCERIDES: 87 (ref 40–160)

## 2017-03-16 LAB — TSH: TSH: 0.76 (ref 0.41–5.90)

## 2017-03-16 LAB — BASIC METABOLIC PANEL
BUN: 13 (ref 4–21)
CREATININE: 0.8 (ref 0.5–1.1)

## 2017-03-16 LAB — VITAMIN D 25 HYDROXY (VIT D DEFICIENCY, FRACTURES): Vit D, 25-Hydroxy: 30

## 2017-04-04 ENCOUNTER — Encounter: Payer: Self-pay | Admitting: Gastroenterology

## 2017-04-07 ENCOUNTER — Ambulatory Visit: Payer: 59 | Admitting: "Endocrinology

## 2017-04-14 ENCOUNTER — Ambulatory Visit (INDEPENDENT_AMBULATORY_CARE_PROVIDER_SITE_OTHER): Payer: 59 | Admitting: "Endocrinology

## 2017-04-14 ENCOUNTER — Encounter: Payer: Self-pay | Admitting: "Endocrinology

## 2017-04-14 VITALS — BP 103/69 | HR 79 | Ht 65.0 in | Wt 145.0 lb

## 2017-04-14 DIAGNOSIS — E063 Autoimmune thyroiditis: Secondary | ICD-10-CM

## 2017-04-14 NOTE — Progress Notes (Signed)
Subjective:    Patient ID: Kimberly Ramsey, female    DOB: 06/07/1985, PCP Wilson Singer, MD   Past Medical History:  Diagnosis Date  . Cleft lip and palate   . Hypothyroid   . Thrombocytopenia complicating pregnancy [facility])    Past Surgical History:  Procedure Laterality Date  . CLEFT PALATE REPAIR    . HIP SURGERY     took bone from hip to fix cleft palate.   Social History   Socioeconomic History  . Marital status: Single    Spouse name: None  . Number of children: None  . Years of education: None  . Highest education level: None  Social Needs  . Financial resource strain: None  . Food insecurity - worry: None  . Food insecurity - inability: None  . Transportation needs - medical: None  . Transportation needs - non-medical: None  Occupational History  . None  Tobacco Use  . Smoking status: Never Smoker  . Smokeless tobacco: Never Used  Substance and Sexual Activity  . Alcohol use: No  . Drug use: No  . Sexual activity: Yes    Birth control/protection: IUD  Other Topics Concern  . None  Social History Narrative  . None   Outpatient Encounter Medications as of 04/14/2017  Medication Sig  . Vitamin D, Ergocalciferol, (DRISDOL) 50000 units CAPS capsule Take 50,000 Units by mouth every 7 (seven) days.  . [DISCONTINUED] metroNIDAZOLE (FLAGYL) 500 MG tablet Take 1 tablet (500 mg total) by mouth 2 (two) times daily. X 7 days. No sex or alcohol while taking   No facility-administered encounter medications on file as of 04/14/2017.    ALLERGIES: Allergies  Allergen Reactions  . Codeine Nausea And Vomiting    VACCINATION STATUS: Immunization History  Administered Date(s) Administered  . Tdap 08/18/2013    HPI  32 year old female patient with medical history as above. She is being seen in follow-up with repeat thyroid function tests after she was seen in consultation for abnormal antithyroid antibodies.    -She did have unremarkable thyroid uptake and  scan in 2018.   -She has no new complaints.  She has lost 8 pounds since last visit largely intentional. - She denies  palpitations, heat intolerance, tremors. She has normal menstrual cycles, mother of 3 children. She denies any family history of thyroid dysfunction. - She denies dysphagia, shortness of breath, voice change.   Review of Systems  Constitutional: + steady weight , no fatigue, no subjective hyperthermia, no subjective hypothermia Eyes: no blurry vision, no xerophthalmia ENT: no sore throat, no nodules palpated in throat, no dysphagia/odynophagia, no hoarseness Cardiovascular: no Chest Pain, no Shortness of Breath, no palpitations, no leg swelling Respiratory: no cough, no SOB Gastrointestinal: no Nausea/Vomiting/Diarhhea Musculoskeletal: no muscle/joint aches Skin: no rashes Neurological: no tremors, no numbness, no tingling, no dizziness Psychiatric: no depression, no anxiety  Objective:    BP 103/69   Pulse 79   Ht 5\' 5"  (1.651 m)   Wt 145 lb (65.8 kg)   BMI 24.13 kg/m   Wt Readings from Last 3 Encounters:  04/14/17 145 lb (65.8 kg)  11/01/16 153 lb (69.4 kg)  10/06/16 151 lb (68.5 kg)    Physical Exam  Constitutional: Appropriate weight for hight, no acute distress, normal state of mind.  Not in acute distress, normal state of mind Eyes: PERRLA, EOMI, no exophthalmos ENT:  + Repaired cleft lip,  moist mucous membranes, no thyromegaly no cervical lymphadenopathy.   Musculoskeletal: no  gross deformities, strength intact in all four extremities Skin: moist, warm, no rashes Neurological: No tremors.  Recent Results (from the past 2160 hour(s))  VITAMIN D 25 Hydroxy (Vit-D Deficiency, Fractures)     Status: None   Collection Time: 03/16/17 12:00 AM  Result Value Ref Range   Vit D, 25-Hydroxy 30   Basic metabolic panel     Status: None   Collection Time: 03/16/17 12:00 AM  Result Value Ref Range   BUN 13 4 - 21   Creatinine 0.8 0.5 - 1.1  Lipid panel      Status: None   Collection Time: 03/16/17 12:00 AM  Result Value Ref Range   Triglycerides 87 40 - 160   Cholesterol 146 0 - 200   HDL 54 35 - 70   LDL Cholesterol 75   TSH     Status: None   Collection Time: 03/16/17 12:00 AM  Result Value Ref Range   TSH 0.76 0.41 - 5.90    Comment: free t4 1.2, free t3 3.1     On 09/30/2016 she underwent thyroid uptake and scan which showed 26% (normal 10-30 percent) uniform uptake with no nodularity.  Assessment & Plan:    Elevated antithyroid autoantibodies -Her previous thyroid workup showed elevated antithyroid antibodies putting her at risk for future thyroid dysfunction.  Her current previsit labs show normal thyroid hormone levels.  She will not require intervention at this time.   -She will have a repeat thyroid function test 1 year from now.   - I advised patient to maintain close follow up with Wilson SingerGosrani, Nimish C, MD for primary care needs. Follow up plan: Return in about 1 year (around 04/15/2018) for follow up with pre-visit labs.  Marquis LunchGebre Jahnia Hewes, MD Phone: [phone]  Fax: [phone]   04/14/2017, 1:42 PM

## 2017-05-11 ENCOUNTER — Ambulatory Visit: Payer: Self-pay | Admitting: Gastroenterology

## 2017-06-08 ENCOUNTER — Encounter: Payer: Self-pay | Admitting: Women's Health

## 2017-06-08 ENCOUNTER — Telehealth: Payer: Self-pay | Admitting: Women's Health

## 2017-06-09 ENCOUNTER — Other Ambulatory Visit: Payer: Self-pay | Admitting: Women's Health

## 2017-06-09 MED ORDER — VALACYCLOVIR HCL 1 G PO TABS: 1000.0000 mg | ORAL_TABLET | Freq: Every day | ORAL | 6 refills | Status: DC

## 2017-06-29 ENCOUNTER — Encounter: Payer: Self-pay | Admitting: Gastroenterology

## 2017-06-29 ENCOUNTER — Telehealth: Payer: Self-pay | Admitting: *Deleted

## 2017-06-29 ENCOUNTER — Ambulatory Visit: Payer: 59 | Admitting: Gastroenterology

## 2017-06-29 DIAGNOSIS — K625 Hemorrhage of anus and rectum: Secondary | ICD-10-CM

## 2017-06-29 DIAGNOSIS — Z800 Family history of malignant neoplasm of digestive organs: Secondary | ICD-10-CM

## 2017-06-29 DIAGNOSIS — K649 Unspecified hemorrhoids: Secondary | ICD-10-CM | POA: Diagnosis not present

## 2017-06-29 HISTORY — DX: Hemorrhage of anus and rectum: K62.5

## 2017-06-29 NOTE — Progress Notes (Signed)
CC'D TO PCP °

## 2017-06-29 NOTE — Telephone Encounter (Signed)
LMOVM to schedule TCS w/ hemorhhoid banding with [facility]

## 2017-06-29 NOTE — Patient Instructions (Signed)
Colonoscopy with hemorrhoid banding as scheduled. See separate instructions.   Continue to use hemorrhoid creams as needed as long as they are helping. If you decide you need prescription strength let me know.

## 2017-06-29 NOTE — Progress Notes (Addendum)
Primary Care Physician:  Wilson Singer, MD  Primary Gastroenterologist:  Jonette Eva, MD  REVIEWED-NO ADDITIONAL RECOMMENDATIONS.  Chief Complaint  Patient presents with  . Hemorrhoids    bleeding at times    HPI:  Kimberly Ramsey is a 32 y.o. female here at the request of Dr. Karilyn Cota for further evaluation of hemorrhoids.  Patient states she has had hemorrhoids since her first child who was 63 years old.  Subsequent worsening hemorrhoids after her last 2 pregnancies, 3 and 5 years ago.  She has one hemorrhoid that wants to persistently stay out.  Denies straining, heavy lifting.  She works in the Banker at [employer].  She also works at the [employer] noting that she does more sitting with that job.  She utilizes Preparation H and hemorrhoid pads with some relief.  Hemorrhoids intermittently bleed, recently had some rectal bleeding.  Denies any constipation, diarrhea, abdominal pain, vomiting, dysphagia, weight loss.  Her father was diagnosed with colon cancer at age 65.  He required chemotherapy and surgery.  Current Outpatient Medications  Medication Sig Dispense Refill  . Vitamin D, Ergocalciferol, (DRISDOL) 50000 units CAPS capsule Take 50,000 Units by mouth every 7 (seven) days.    . valACYclovir (VALTREX) 1000 MG tablet Take 1 tablet (1,000 mg total) by mouth daily. X 5 days (Patient not taking: Reported on 06/29/2017) 5 tablet 6   No current facility-administered medications for this visit.     Allergies as of 06/29/2017 - Review Complete 06/29/2017  Allergen Reaction Noted  . Codeine Nausea And Vomiting 12/27/2012    Past Medical History:  Diagnosis Date  . Cleft lip and palate   . Thrombocytopenia complicating pregnancy (HCC)   . Thyroid disease    followed by Dr. Fransico Him    Past Surgical History:  Procedure Laterality Date  . CLEFT PALATE REPAIR    . HIP SURGERY     took bone from hip to fix cleft palate.    Family History  Problem  Relation Age of Onset  . Mental illness Mother   . Schizophrenia Mother   . Lupus Father   . Colon cancer Father 26       surgery/chemo    Social History   Socioeconomic History  . Marital status: Single    Spouse name: Not on file  . Number of children: Not on file  . Years of education: Not on file  . Highest education level: Not on file  Occupational History  . Not on file  Social Needs  . Financial resource strain: Not on file  . Food insecurity:    Worry: Not on file    Inability: Not on file  . Transportation needs:    Medical: Not on file    Non-medical: Not on file  Tobacco Use  . Smoking status: Never Smoker  . Smokeless tobacco: Never Used  Substance and Sexual Activity  . Alcohol use: No  . Drug use: No  . Sexual activity: Yes    Birth control/protection: IUD  Lifestyle  . Physical activity:    Days per week: Not on file    Minutes per session: Not on file  . Stress: Not on file  Relationships  . Social connections:    Talks on phone: Not on file    Gets together: Not on file    Attends religious service: Not on file    Active member of club or organization: Not on file    Attends meetings of  clubs or organizations: Not on file    Relationship status: Not on file  . Intimate partner violence:    Fear of current or ex partner: Not on file    Emotionally abused: Not on file    Physically abused: Not on file    Forced sexual activity: Not on file  Other Topics Concern  . Not on file  Social History Narrative  . Not on file      ROS:  General: Negative for anorexia, weight loss, fever, chills, fatigue, weakness. Eyes: Negative for vision changes.  ENT: Negative for hoarseness, difficulty swallowing , nasal congestion. CV: Negative for chest pain, angina, palpitations, dyspnea on exertion, peripheral edema.  Respiratory: Negative for dyspnea at rest, dyspnea on exertion, cough, sputum, wheezing.  GI: See history of present illness. GU:   Negative for dysuria, hematuria, urinary incontinence, urinary frequency, nocturnal urination.  MS: Negative for joint pain, low back pain.  Derm: Negative for rash or itching.  Neuro: Negative for weakness, abnormal sensation, seizure, frequent headaches, memory loss, confusion.  Psych: Negative for anxiety, depression, suicidal ideation, hallucinations.  Endo: Negative for unusual weight change.  Heme: Negative for bruising or bleeding. Allergy: Negative for rash or hives.    Physical Examination:  BP 106/70   Pulse 75   Temp 97.8 F (36.6 C) (Oral)   Ht  (1.651 m)   Wt 144 lb (65.3 kg)   LMP 06/08/2017 (Approximate)   BMI 23.96 kg/m    General: Well-nourished, well-developed in no acute distress.  Head: Normocephalic, atraumatic.   Eyes: Conjunctiva pink, no icterus. Mouth: Oropharyngeal mucosa moist and pink , no lesions erythema or exudate. Neck: Supple without thyromegaly, masses, or lymphadenopathy.  Lungs: Clear to auscultation bilaterally.  Heart: Regular rate and rhythm, no murmurs rubs or gallops.  Abdomen: Bowel sounds are normal, nontender, nondistended, no hepatosplenomegaly or masses, no abdominal bruits or    hernia , no rebound or guarding.   Rectal: Deferred Extremities: No lower extremity edema. No clubbing or deformities.  Neuro: Alert and oriented x 4 , grossly normal neurologically.  Skin: Warm and dry, no rash or jaundice.   Psych: Alert and cooperative, normal mood and affect.  Labs: Lab Results  Component Value Date   CREATININE 0.8 03/16/2017   BUN 13 03/16/2017   NA 141 09/15/2016   K 3.9 09/15/2016   CL 104 09/15/2016   CO2 30 09/15/2016   Lab Results  Component Value Date   ALT 16 09/15/2016   AST 18 09/15/2016   ALKPHOS 109 09/15/2016   BILITOT 0.6 09/15/2016   Lab Results  Component Value Date   WBC 6.3 09/15/2016   HGB 15.9 (H) 09/15/2016   HCT 46.7 (H) 09/15/2016   MCV 84.6 09/15/2016   PLT 163 09/15/2016     Imaging  Studies: No results found.

## 2017-06-29 NOTE — Assessment & Plan Note (Signed)
32 year old female presenting for further evaluation management of hemorrhoids, rectal bleeding.  She has a family history of colon cancer, father at age 17.  Patient has never had a colonoscopy.  Given intermittent rectal bleeding although likely related to hemorrhoids, would offer her colonoscopy for further evaluation.  Consider hemorrhoid banding at time of colonoscopy. I have discussed the risks, alternatives, benefits with regards to but not limited to the risk of reaction to medication, bleeding, infection, perforation and the patient is agreeable to proceed. Written consent to be obtained.  She has physical training, and up with her job in August and September and would like to pursue her procedure in time to fully recover before PT begins.  In the interim she will continue topical regimens for her hemorrhoids, may use over-the-counter as long as she continues to get relief.  Otherwise she will call for prescription

## 2017-06-30 ENCOUNTER — Ambulatory Visit: Payer: Self-pay | Admitting: Gastroenterology

## 2017-06-30 NOTE — Telephone Encounter (Signed)
LMOVM. Will mail letter to call 

## 2017-07-01 ENCOUNTER — Telehealth: Payer: Self-pay | Admitting: Gastroenterology

## 2017-07-01 ENCOUNTER — Ambulatory Visit: Payer: Self-pay | Admitting: Gastroenterology

## 2017-07-01 NOTE — Telephone Encounter (Signed)
Patient called and left message she was calling to schedule procedure.  Please call back.

## 2017-07-01 NOTE — Telephone Encounter (Signed)
PATIENT CALLED AND WAS TOLD THAT SHE WILL BE CALLED NEXT WEEK TO SCHEDULE HER PROCEDURE

## 2017-07-01 NOTE — Telephone Encounter (Signed)
LMOVM

## 2017-07-05 ENCOUNTER — Other Ambulatory Visit: Payer: Self-pay

## 2017-07-05 DIAGNOSIS — K625 Hemorrhage of anus and rectum: Secondary | ICD-10-CM

## 2017-07-05 DIAGNOSIS — K649 Unspecified hemorrhoids: Secondary | ICD-10-CM

## 2017-07-05 DIAGNOSIS — Z800 Family history of malignant neoplasm of digestive organs: Secondary | ICD-10-CM

## 2017-07-05 MED ORDER — NA SULFATE-K SULFATE-MG SULF 17.5-3.13-1.6 GM/177ML PO SOLN: 1.0000 | ORAL | 0 refills | Status: DC

## 2017-07-05 NOTE — Telephone Encounter (Signed)
Pt called office, TCS/hemorrhoid banding w/[facility] scheduled for 07/21/17 at 1:30pm. Rx for prep sent to pharmacy. Instructions mailed. Orders entered.

## 2017-07-05 NOTE — Telephone Encounter (Signed)
PA info for TCS/hem banding submitted via [facility] website. Case approved. PA# [id number].

## 2017-07-05 NOTE — Telephone Encounter (Signed)
LMOVM

## 2017-07-21 ENCOUNTER — Encounter (HOSPITAL_COMMUNITY)
Admission: RE | Disposition: A | Discharge status: Home / Self Care | Payer: Self-pay | Source: Ambulatory Visit | Attending: Gastroenterology

## 2017-07-21 ENCOUNTER — Encounter (HOSPITAL_COMMUNITY): Payer: Self-pay

## 2017-07-21 ENCOUNTER — Ambulatory Visit (HOSPITAL_COMMUNITY)
Admission: RE | Admit: 2017-07-21 | Discharge: 2017-07-21 | Disposition: A | Discharge status: Home / Self Care | Payer: 59 | Source: Ambulatory Visit | Attending: Gastroenterology | Admitting: Gastroenterology

## 2017-07-21 DIAGNOSIS — Z800 Family history of malignant neoplasm of digestive organs: Secondary | ICD-10-CM | POA: Diagnosis not present

## 2017-07-21 DIAGNOSIS — Z79899 Other long term (current) drug therapy: Secondary | ICD-10-CM | POA: Insufficient documentation

## 2017-07-21 DIAGNOSIS — K649 Unspecified hemorrhoids: Secondary | ICD-10-CM

## 2017-07-21 DIAGNOSIS — E079 Disorder of thyroid, unspecified: Secondary | ICD-10-CM | POA: Diagnosis not present

## 2017-07-21 DIAGNOSIS — Z885 Allergy status to narcotic agent status: Secondary | ICD-10-CM | POA: Insufficient documentation

## 2017-07-21 DIAGNOSIS — Q438 Other specified congenital malformations of intestine: Secondary | ICD-10-CM | POA: Insufficient documentation

## 2017-07-21 DIAGNOSIS — K625 Hemorrhage of anus and rectum: Secondary | ICD-10-CM

## 2017-07-21 DIAGNOSIS — K648 Other hemorrhoids: Secondary | ICD-10-CM | POA: Diagnosis not present

## 2017-07-21 DIAGNOSIS — Z9889 Other specified postprocedural states: Secondary | ICD-10-CM | POA: Insufficient documentation

## 2017-07-21 DIAGNOSIS — Z8489 Family history of other specified conditions: Secondary | ICD-10-CM | POA: Diagnosis not present

## 2017-07-21 DIAGNOSIS — K921 Melena: Secondary | ICD-10-CM

## 2017-07-21 DIAGNOSIS — Z7982 Long term (current) use of aspirin: Secondary | ICD-10-CM | POA: Diagnosis not present

## 2017-07-21 DIAGNOSIS — Z818 Family history of other mental and behavioral disorders: Secondary | ICD-10-CM | POA: Diagnosis not present

## 2017-07-21 DIAGNOSIS — K644 Residual hemorrhoidal skin tags: Secondary | ICD-10-CM | POA: Insufficient documentation

## 2017-07-21 HISTORY — PX: HEMORRHOID BANDING: SHX5850

## 2017-07-21 HISTORY — PX: COLONOSCOPY: SHX5424

## 2017-07-21 SURGERY — COLONOSCOPY
Anesthesia: Moderate Sedation

## 2017-07-21 MED ORDER — SODIUM CHLORIDE 0.9 % IV SOLN
INTRAVENOUS | Status: DC
  Administered 2017-07-21: 13:00:00 via INTRAVENOUS

## 2017-07-21 MED ORDER — MIDAZOLAM HCL 5 MG/5ML IJ SOLN
INTRAMUSCULAR | Status: AC
  Filled 2017-07-21: qty 10

## 2017-07-21 MED ORDER — MIDAZOLAM HCL 5 MG/5ML IJ SOLN
INTRAMUSCULAR | Status: DC | PRN
  Administered 2017-07-21: 1 mg via INTRAVENOUS
  Administered 2017-07-21 (×2): 2 mg via INTRAVENOUS
  Administered 2017-07-21: 1 mg via INTRAVENOUS
  Administered 2017-07-21: 2 mg via INTRAVENOUS

## 2017-07-21 MED ORDER — MEPERIDINE HCL 100 MG/ML IJ SOLN
INTRAMUSCULAR | Status: AC
  Filled 2017-07-21: qty 2

## 2017-07-21 MED ORDER — MEPERIDINE HCL 100 MG/ML IJ SOLN
INTRAMUSCULAR | Status: DC | PRN
  Administered 2017-07-21 (×4): 25 mg

## 2017-07-21 NOTE — Op Note (Signed)
[facility] Patient Name: Kimberly Ramsey Procedure Date: 07/21/2017 2:17 PM MRN: [id number] Date of Birth: July 31, 1985 Attending MD: Jonette Eva MD, MD CSN: [id number] Age: 32 Admit Type: Outpatient Procedure:                Colonoscopy WITH HEMORRHOID BANDING Indications:              Hematochezia Providers:                Jonette Eva MD, MD, Jannett Celestine, RN, Edythe Clarity,                            Technician Referring MD:             Wilson Singer MD, MD Medicines:                Meperidine 100 mg IV, Midazolam 8 mg IV Complications:            No immediate complications. HAD PAIN AFTER BANDS                            PLACED. BANDS LIFTED AND REMAIN IN PLACE. PAIN                            RELIEVED. Estimated Blood Loss:     Estimated blood loss: none. Estimated blood loss                            was minimal. Procedure:                Pre-Anesthesia Assessment:                           - Prior to the procedure, a History and Physical                            was performed, and patient medications and                            allergies were reviewed. The patient's tolerance of                            previous anesthesia was also reviewed. The risks                            and benefits of the procedure and the sedation                            options and risks were discussed with the patient.                            All questions were answered, and informed consent                            was obtained. Prior Anticoagulants: The patient has  taken aspirin, last dose was 7 days prior to                            procedure. ASA Grade Assessment: II - A patient                            with mild systemic disease. After reviewing the                            risks and benefits, the patient was deemed in                            satisfactory condition to undergo the procedure.                            After obtaining  informed consent, the colonoscope                            was passed under direct vision. Throughout the                            procedure, the patient's blood pressure, pulse, and                            oxygen saturations were monitored continuously. The                            EC38-[id number] ([id number]) scope was introduced through                            the anus and advanced to the 10 cm into the ileum.                            The colonoscopy was technically difficult and                            complex due to a tortuous colon. Successful                            completion of the procedure was aided by increasing                            the dose of sedation medication, straightening and                            shortening the scope to obtain bowel loop reduction                            and COLOWRAP. The patient tolerated the procedure                            fairly well. The quality of the bowel preparation  was excellent. The terminal ileum, ileocecal valve,                            appendiceal orifice, and rectum were photographed. Scope In: 2:47:18 PM Scope Out: 3:13:51 PM Scope Withdrawal Time: 0 hours 21 minutes 45 seconds  Total Procedure Duration: 0 hours 26 minutes 33 seconds  Findings:      The terminal ileum appeared normal.      The recto-sigmoid colon, sigmoid colon and descending colon were       significantly tortuous.      Internal hemorrhoids were found. The hemorrhoids were moderate. Three       bands were successfully placed. There was no bleeding at the end of the       maneuver.      External hemorrhoids were found. The hemorrhoids were moderate. Impression:               - The examined portion of the ileum was normal.                           - Tortuous colon.                           - RECTAL BLEEDING DUE TO Internal hemorrhoids. S/P                            BANDING x3                           -  External hemorrhoids. Moderate Sedation:      Moderate (conscious) sedation was administered by the endoscopy nurse       and supervised by the endoscopist. The following parameters were       monitored: oxygen saturation, heart rate, blood pressure, and response       to care. Total physician intraservice time was 44 minutes. Recommendation:           - Repeat colonoscopy in 5 years for surveillance.                           - High fiber diet.                           - Continue present medications.                           - Return to my office in 6 months.                           - Patient has a contact number available for                            emergencies. The signs and symptoms of potential                            delayed complications were discussed with the  patient. Return to normal activities tomorrow.                            Written discharge instructions were provided to the                            patient. Procedure Code(s):        --- Professional ---                           [phone], Colonoscopy, flexible; with band ligation(s)                            (eg, hemorrhoids)                           G0500, Moderate sedation services provided by the                            same physician or other qualified health care                            professional performing a gastrointestinal                            endoscopic service that sedation supports,                            requiring the presence of an independent trained                            observer to assist in the monitoring of the                            patient's level of consciousness and physiological                            status; initial 15 minutes of intra-service time;                            patient age 59 years or older (additional time may                            be reported with 19147, as appropriate)                           [phone],  Moderate sedation services provided by the                            same physician or other qualified health care                            professional performing the diagnostic or  therapeutic service that the sedation supports,                            requiring the presence of an independent trained                            observer to assist in the monitoring of the                            patient's level of consciousness and physiological                            status; each additional 15 minutes intraservice                            time (List separately in addition to code for                            primary service)                           [phone], Moderate sedation services provided by the                            same physician or other qualified health care                            professional performing the diagnostic or                            therapeutic service that the sedation supports,                            requiring the presence of an independent trained                            observer to assist in the monitoring of the                            patient's level of consciousness and physiological                            status; each additional 15 minutes intraservice                            time (List separately in addition to code for                            primary service) Diagnosis Code(s):        --- Professional ---                           K64.4, Residual hemorrhoidal skin tags  K64.8, Other hemorrhoids                           K92.1, Melena (includes Hematochezia)                           Q43.8, Other specified congenital malformations of                            intestine CPT copyright 2017 [employer]. All rights reserved. The codes documented in this report are preliminary and upon coder review may  be revised to meet current compliance  requirements. Jonette Eva, MD Jonette Eva MD, MD 07/21/2017 3:31:58 PM This report has been signed electronically. Number of Addenda: 0

## 2017-07-21 NOTE — H&P (Signed)
Primary Care Physician:  Doree Albee, MD Primary Gastroenterologist:  Dr. Oneida Alar  Pre-Procedure History & Physical: HPI:  Kimberly Ramsey is a 32 y.o. female here for RECTAL BLEEDING/FAMHx: COLON CANCER.  Past Medical History:  Diagnosis Date  . Cleft lip and palate   . Thrombocytopenia complicating pregnancy ([place])   . Thyroid disease    followed by Dr. Dorris Fetch    Past Surgical History:  Procedure Laterality Date  . CLEFT PALATE REPAIR    . HIP SURGERY     took bone from hip to fix cleft palate.    Prior to Admission medications   Medication Sig Start Date End Date Taking? Authorizing Provider  aspirin-acetaminophen-caffeine (EXCEDRIN MIGRAINE) [phone] MG tablet Take 2 tablets by mouth every 6 (six) hours as needed for headache.    Yes [provider]  Biotin 5000 MCG TABS Take 5,000 mcg by mouth daily.   Yes [provider]  Cholecalciferol (VITAMIN D-3) 5000 units TABS Take 5,000 Units by mouth daily.   Yes [provider]  Na Sulfate-K Sulfate-Mg Sulf (SUPREP BOWEL PREP KIT) 17.5-3.13-1.6 GM/177ML SOLN Take 1 kit by mouth as directed. 07/05/17  Yes Aleese Kamps, Marga Melnick, MD  Naphazoline-Glycerin (CLEAR EYES MAX REDNESS RELIEF OP) Place 2 drops into both eyes daily as needed (red eyes).   Yes [provider]  valACYclovir (VALTREX) 1000 MG tablet Take 1 tablet (1,000 mg total) by mouth daily. X 5 days Patient taking differently: Take 1,000 mg by mouth daily as needed.  06/09/17  Yes Booker, Royetta Crochet, CNM  Witch Hazel (PREPARATION H EX) Place 1 application rectally 2 (two) times daily as needed (hemrrhoids).   Yes [provider]    Allergies as of 07/05/2017 - Review Complete 06/29/2017  Allergen Reaction Noted  . Codeine Nausea And Vomiting 12/27/2012    Family History  Problem Relation Age of Onset  . Mental illness Mother   . Schizophrenia Mother   . Lupus Father   . Colon cancer Father 31       surgery/chemo  . Colon  polyps Neg Hx     Social History   Socioeconomic History  . Marital status: Single    Spouse name: Not on file  . Number of children: Not on file  . Years of education: Not on file  . Highest education level: Not on file  Occupational History  . Not on file  Social Needs  . Financial resource strain: Not on file  . Food insecurity:    Worry: Not on file    Inability: Not on file  . Transportation needs:    Medical: Not on file    Non-medical: Not on file  Tobacco Use  . Smoking status: Never Smoker  . Smokeless tobacco: Never Used  Substance and Sexual Activity  . Alcohol use: No  . Drug use: No  . Sexual activity: Yes    Birth control/protection: IUD  Lifestyle  . Physical activity:    Days per week: Not on file    Minutes per session: Not on file  . Stress: Not on file  Relationships  . Social connections:    Talks on phone: Not on file    Gets together: Not on file    Attends religious service: Not on file    Active member of club or organization: Not on file    Attends meetings of clubs or organizations: Not on file    Relationship status: Not on file  . Intimate  partner violence:    Fear of current or ex partner: Not on file    Emotionally abused: Not on file    Physically abused: Not on file    Forced sexual activity: Not on file  Other Topics Concern  . Not on file  Social History Narrative  . Not on file    Review of Systems: See HPI, otherwise negative ROS   Physical Exam: BP 125/87   Pulse 94   Temp 98 F (36.7 C) (Oral)   Resp 16   Ht _0  (1.651 m)   Wt 140 lb (63.5 kg)   SpO2 100%   BMI 23.30 kg/m  General:   Alert,  pleasant and cooperative in NAD Head:  Normocephalic and atraumatic. Neck:  Supple; Lungs:  Clear throughout to auscultation.    Heart:  Regular rate and rhythm. Abdomen:  Soft, nontender and nondistended. Normal bowel sounds, without guarding, and without rebound.   Neurologic:  Alert and  oriented x4;  grossly  normal neurologically.  Impression/Plan:     RECTAL BLEEDING  PLAN:  1. TCS/? Hemorrhoid banding TODAY DISCUSSED PROCEDURE, BENEFITS, & RISKS: < 1% chance of medication reaction, bleeding, perforation, PELVIC VEIN SEPSIS, or rupture of spleen/liver.

## 2017-07-21 NOTE — Discharge Instructions (Signed)
You have MODERATE internal hemorrhoids. YOU DID NOT HAVE ANY POLYPS. I PLACED 3 BANDS TO STOP THE HEMORRHOIDAL BLEEDING. YOU MAY SEE SOME MILD BLEEDING OVER THE NEXT 3 TO 5 DAYS.   PLEASE CALL [phone] IF YOU HAVE A FEVER, A LARGE AMOUNT OF BLEEDING, OR DIFFICULTY URINATING.  DRINK WATER TO KEEP URINE LIGHT YELLOW.  USE NAPROXEN TWICE DAILY IF NEEDED FOR RECTAL DISCOMFORT OR TYLENOL IF NEEDED FOR ADDITIONAL PAIN RELIEF.  IF NEEDED USE COLACE TWICE DAILY TO SOFTEN STOOL.  FOLLOW A HIGH FIBER DIET. AVOID ITEMS THAT CAUSE BLOATING & GAS. SEE INFO BELOW.  Next colonoscopy AT AGE 35.    Colonoscopy Care After Read the instructions outlined below and refer to this sheet in the next week. These discharge instructions provide you with general information on caring for yourself after you leave the hospital. While your treatment has been planned according to the most current medical practices available, unavoidable complications occasionally occur. If you have any problems or questions after discharge, call DR. Margarito Dehaas, [phone].  ACTIVITY  You may resume your regular activity, but move at a slower pace for the next 24 hours.   Take frequent rest periods for the next 24 hours.   Walking will help get rid of the air and reduce the bloated feeling in your belly (abdomen).   No driving for 24 hours (because of the medicine (anesthesia) used during the test).   You may shower.   Do not sign any important legal documents or operate any machinery for 24 hours (because of the anesthesia used during the test).    NUTRITION  Drink plenty of fluids.   You may resume your normal diet as instructed by your doctor.   Begin with a light meal and progress to your normal diet. Heavy or fried foods are harder to digest and may make you feel sick to your stomach (nauseated).   Avoid alcoholic beverages for 24 hours or as instructed.    MEDICATIONS  You may resume your normal  medications.   WHAT YOU CAN EXPECT TODAY  Some feelings of bloating in the abdomen.   Passage of more gas than usual.   Spotting of blood in your stool or on the toilet paper  .  IF YOU HAD POLYPS REMOVED DURING THE COLONOSCOPY:  Eat a soft diet IF YOU HAVE NAUSEA, BLOATING, ABDOMINAL PAIN, OR VOMITING.    FINDING OUT THE RESULTS OF YOUR TEST Not all test results are available during your visit. DR. Darrick Penna WILL CALL YOU WITHIN 7 DAYS OF YOUR PROCEDUE WITH YOUR RESULTS. Do not assume everything is normal if you have not heard from DR. Marabelle Cushman IN ONE WEEK, CALL HER OFFICE AT [phone].  SEEK IMMEDIATE MEDICAL ATTENTION AND CALL THE OFFICE: [phone] IF:  You have more than a spotting of blood in your stool.   Your belly is swollen (abdominal distention).   You are nauseated or vomiting.   You have a temperature over 101F.   You have abdominal pain or discomfort that is severe or gets worse throughout the day.   Hemorrhoids Hemorrhoids are dilated (enlarged) veins around the rectum. Sometimes clots will form in the veins. This makes them swollen and painful. These are called thrombosed hemorrhoids. Causes of hemorrhoids include:  Constipation.   Straining to have a bowel movement.   HEAVY LIFTING  HOME CARE INSTRUCTIONS  Eat a well balanced diet and drink 6 to 8 glasses of water every day to avoid constipation. You may also  use a bulk laxative.   Avoid straining to have bowel movements.   Keep anal area dry and clean.   Do not use a donut shaped pillow or sit on the toilet for long periods. This increases blood pooling and pain.   Move your bowels when your body has the urge; this will require less straining and will decrease pain and pressure.  High-Fiber Diet A high-fiber diet changes your normal diet to include more whole grains, legumes, fruits, and vegetables. Changes in the diet involve replacing refined carbohydrates with unrefined foods. The calorie  level of the diet is essentially unchanged. The Dietary Reference Intake (recommended amount) for adult males is 38 grams per day. For adult females, it is 25 grams per day. Pregnant and lactating women should consume 28 grams of fiber per day. Fiber is the intact part of a plant that is not broken down during digestion. Functional fiber is fiber that has been isolated from the plant to provide a beneficial effect in the body. PURPOSE  Increase stool bulk.   Ease and regulate bowel movements.   Lower cholesterol.   REDUCE RISK OF COLON CANCER  INDICATIONS THAT YOU NEED MORE FIBER  Constipation and hemorrhoids.   Uncomplicated diverticulosis (intestine condition) and irritable bowel syndrome.   Weight management.   As a protective measure against hardening of the arteries (atherosclerosis), diabetes, and cancer.   GUIDELINES FOR INCREASING FIBER IN THE DIET  Start adding fiber to the diet slowly. A gradual increase of about 5 more grams (2 slices of whole-wheat bread, 2 servings of most fruits or vegetables, or 1 bowl of high-fiber cereal) per day is best. Too rapid an increase in fiber may result in constipation, flatulence, and bloating.   Drink enough water and fluids to keep your urine clear or pale yellow. Water, juice, or caffeine-free drinks are recommended. Not drinking enough fluid may cause constipation.   Eat a variety of high-fiber foods rather than one type of fiber.   Try to increase your intake of fiber through using high-fiber foods rather than fiber pills or supplements that contain small amounts of fiber.   The goal is to change the types of food eaten. Do not supplement your present diet with high-fiber foods, but replace foods in your present diet.   INCLUDE A VARIETY OF FIBER SOURCES  Replace refined and processed grains with whole grains, canned fruits with fresh fruits, and incorporate other fiber sources. White rice, white breads, and most bakery goods  contain little or no fiber.   Brown whole-grain rice, buckwheat oats, and many fruits and vegetables are all good sources of fiber. These include: broccoli, Brussels sprouts, cabbage, cauliflower, beets, sweet potatoes, white potatoes (skin on), carrots, tomatoes, eggplant, squash, berries, fresh fruits, and dried fruits.   Cereals appear to be the richest source of fiber. Cereal fiber is found in whole grains and bran. Bran is the fiber-rich outer coat of cereal grain, which is largely removed in refining. In whole-grain cereals, the bran remains. In breakfast cereals, the largest amount of fiber is found in those with "bran" in their names. The fiber content is sometimes indicated on the label.   You may need to include additional fruits and vegetables each day.   In baking, for 1 cup white flour, you may use the following substitutions:   1 cup whole-wheat flour minus 2 tablespoons.   1/2 cup white flour plus 1/2 cup whole-wheat flour.

## 2017-07-22 ENCOUNTER — Telehealth: Payer: Self-pay

## 2017-07-22 MED ORDER — NAPROXEN 500 MG PO TABS: 500.0000 mg | ORAL_TABLET | Freq: Two times a day (BID) | ORAL | 0 refills | Status: AC

## 2017-07-22 MED ORDER — HYDROCODONE-ACETAMINOPHEN 5-325 MG PO TABS: ORAL_TABLET | ORAL | 0 refills | Status: DC

## 2017-07-22 NOTE — Telephone Encounter (Signed)
Pt having severe achy, NOT pain or pinching. BACK TO WORK JUN 17.  SEND RX NAPROXEN 500 MG BID FOR 5 DAYS. TOLERATED VICODIN IN THE PAST. SITZ BATHS TID FOR 3 DAYS. DOESN'T WANT TO TAKE BANDS OF RIGHT NOW.

## 2017-07-22 NOTE — Telephone Encounter (Signed)
Dr. Darrick PennaFields, pt called this morning with lots of pain s/p her procedure and banding yesterday. Pt hasn't taken any medications as of yet. Was Naproxen a prescription that needed to be called in or does the pt need to take an otc Aleve? Pt didn't want to take Tylenol since she feels it doesn't help her. Please advise.

## 2017-07-22 NOTE — Addendum Note (Signed)
Addended by: West BaliFIELDS, Lauris Serviss L on: 07/22/2017 08:54 AM   Modules accepted: Orders

## 2017-07-28 ENCOUNTER — Encounter (HOSPITAL_COMMUNITY): Payer: Self-pay | Admitting: Gastroenterology

## 2017-07-29 ENCOUNTER — Encounter: Payer: Self-pay | Admitting: Women's Health

## 2017-07-29 ENCOUNTER — Other Ambulatory Visit: Payer: Self-pay | Admitting: Women's Health

## 2017-07-29 MED ORDER — FLUCONAZOLE 150 MG PO TABS: 150.0000 mg | ORAL_TABLET | Freq: Once | ORAL | 0 refills | Status: AC

## 2017-12-06 ENCOUNTER — Encounter: Payer: Self-pay | Admitting: Gastroenterology

## 2018-03-20 DIAGNOSIS — E559 Vitamin D deficiency, unspecified: Secondary | ICD-10-CM | POA: Diagnosis not present

## 2018-03-20 DIAGNOSIS — Z0000 Encounter for general adult medical examination without abnormal findings: Secondary | ICD-10-CM | POA: Diagnosis not present

## 2018-03-20 DIAGNOSIS — R5383 Other fatigue: Secondary | ICD-10-CM | POA: Diagnosis not present

## 2018-03-20 DIAGNOSIS — E785 Hyperlipidemia, unspecified: Secondary | ICD-10-CM | POA: Diagnosis not present

## 2018-03-20 DIAGNOSIS — R946 Abnormal results of thyroid function studies: Secondary | ICD-10-CM | POA: Diagnosis not present

## 2018-03-20 LAB — LIPID PANEL
Cholesterol: 137 (ref 0–200)
HDL: 45 (ref 35–70)
LDL Cholesterol: 61
Triglycerides: 265 — AB (ref 40–160)

## 2018-03-20 LAB — BASIC METABOLIC PANEL
BUN: 12 (ref 4–21)
Creatinine: 0.8 (ref 0.5–1.1)

## 2018-03-20 LAB — TSH: TSH: 0.77 (ref 0.41–5.90)

## 2018-03-20 LAB — VITAMIN D 25 HYDROXY (VIT D DEFICIENCY, FRACTURES): VIT D 25 HYDROXY: 52

## 2018-04-17 ENCOUNTER — Ambulatory Visit (INDEPENDENT_AMBULATORY_CARE_PROVIDER_SITE_OTHER): Payer: 59 | Admitting: "Endocrinology

## 2018-04-17 ENCOUNTER — Encounter: Payer: Self-pay | Admitting: "Endocrinology

## 2018-04-17 VITALS — BP 114/78 | HR 71 | Ht 65.0 in | Wt 153.0 lb

## 2018-04-17 DIAGNOSIS — E063 Autoimmune thyroiditis: Secondary | ICD-10-CM | POA: Diagnosis not present

## 2018-04-17 NOTE — Progress Notes (Signed)
Endocrinology follow-up note   Subjective:    Patient ID: Kimberly Ramsey, female    DOB: December 15, 1985, PCP Wilson Singer, MD   Past Medical History:  Diagnosis Date  . Cleft lip and palate   . Thrombocytopenia complicating pregnancy ([facility])   . Thyroid disease    followed by Dr. Fransico Him   Past Surgical History:  Procedure Laterality Date  . CLEFT PALATE REPAIR    . COLONOSCOPY N/A 07/21/2017   Procedure: COLONOSCOPY;  Surgeon: West Bali, MD;  Location: [facility];  Service: Endoscopy;  Laterality: N/A;  1:30pm  . HEMORRHOID BANDING N/A 07/21/2017   Procedure: [facility];  Surgeon: West Bali, MD;  Location: [facility];  Service: Endoscopy;  Laterality: N/A;  . HIP SURGERY     took bone from hip to fix cleft palate.   Social History   Socioeconomic History  . Marital status: Single    Spouse name: Not on file  . Number of children: Not on file  . Years of education: Not on file  . Highest education level: Not on file  Occupational History  . Not on file  Social Needs  . Financial resource strain: Not on file  . Food insecurity:    Worry: Not on file    Inability: Not on file  . Transportation needs:    Medical: Not on file    Non-medical: Not on file  Tobacco Use  . Smoking status: Never Smoker  . Smokeless tobacco: Never Used  Substance and Sexual Activity  . Alcohol use: No  . Drug use: No  . Sexual activity: Yes    Birth control/protection: I.U.D.  Lifestyle  . Physical activity:    Days per week: Not on file    Minutes per session: Not on file  . Stress: Not on file  Relationships  . Social connections:    Talks on phone: Not on file    Gets together: Not on file    Attends religious service: Not on file    Active member of club or organization: Not on file    Attends meetings of clubs or organizations: Not on file    Relationship status: Not on file  Other Topics Concern  . Not on file  Social History Narrative  . Not on  file   Outpatient Encounter Medications as of 04/17/2018  Medication Sig  . aspirin-acetaminophen-caffeine (EXCEDRIN MIGRAINE) 250-250-65 MG tablet Take 2 tablets by mouth every 6 (six) hours as needed for headache.   . Biotin 5000 MCG TABS Take 5,000 mcg by mouth daily.  . Cholecalciferol (VITAMIN D-3) 5000 units TABS Take 5,000 Units by mouth daily.  . Naphazoline-Glycerin (CLEAR EYES MAX REDNESS RELIEF OP) Place 2 drops into both eyes daily as needed (red eyes).  . [DISCONTINUED] HYDROcodone-acetaminophen (NORCO/VICODIN) 5-325 MG tablet 1 OR 2 EVERY 4-6 H PRN FOR PAIN  . [DISCONTINUED] valACYclovir (VALTREX) 1000 MG tablet Take 1 tablet (1,000 mg total) by mouth daily. X 5 days (Patient taking differently: Take 1,000 mg by mouth daily as needed. )  . [DISCONTINUED] Witch Hazel (PREPARATION H EX) Place 1 application rectally 2 (two) times daily as needed (hemrrhoids).   No facility-administered encounter medications on file as of 04/17/2018.    ALLERGIES: Allergies  Allergen Reactions  . Codeine Nausea And Vomiting    VACCINATION STATUS: Immunization History  Administered Date(s) Administered  . Tdap 08/18/2013    HPI -33 year old female patient with medical history as above. She  is being seen in follow-up with repeat thyroid function tests after she was seen in consultation for abnormal antithyroid antibodies.   -She has no new complaints today.  She is not on antithyroid medications nor thyroid hormone supplements. -She did have unremarkable thyroid uptake and scan in 2018.    - She denies  palpitations, heat intolerance, tremors.  Has gained 13 pounds since last visit.   She has normal menstrual cycles, mother of 3 children. She denies any family history of thyroid dysfunction. - She denies dysphagia, shortness of breath, voice change.   Review of Systems  Constitutional: + gained weight , no fatigue, no subjective hyperthermia, no subjective hypothermia Eyes: no blurry  vision, no xerophthalmia ENT: no sore throat, no nodules palpated in throat, no dysphagia/odynophagia, no hoarseness Cardiovascular: no pain, no shortness of breath, no palpitations.   Respiratory: no cough, no SOB Gastrointestinal: no Nausea/Vomiting/Diarhhea Musculoskeletal: no muscle/joint aches Skin: no rashes Neurological: no tremors, no numbness, no tingling.  Psychiatric: no depression, no anxiety  Objective:    BP 114/78   Pulse 71   Ht 5\' 5"  (1.651 m)   Wt 153 lb (69.4 kg)   BMI 25.46 kg/m   Wt Readings from Last 3 Encounters:  04/17/18 153 lb (69.4 kg)  07/21/17 140 lb (63.5 kg)  06/29/17 144 lb (65.3 kg)    Physical Exam  Constitutional: + Overweight for height, not in acute distress, normal state of mind.    Eyes: PERRLA, EOMI, no exophthalmos ENT:  + Repaired cleft lip,  moist mucous membranes, + no gross thyromegaly,  no cervical lymphadenopathy.   Musculoskeletal: no gross deformities, strength intact in all four extremities Skin: moist, warm, no rashes Neurological: No tremors.  Recent Results (from the past 2160 hour(s))  VITAMIN D 25 Hydroxy (Vit-D Deficiency, Fractures)     Status: None   Collection Time: 03/20/18 12:00 AM  Result Value Ref Range   Vit D, 25-Hydroxy 52   Basic metabolic panel     Status: None   Collection Time: 03/20/18 12:00 AM  Result Value Ref Range   BUN 12 4 - 21   Creatinine 0.8 0.5 - 1.1  Lipid panel     Status: Abnormal   Collection Time: 03/20/18 12:00 AM  Result Value Ref Range   Triglycerides 265 (A) 40 - 160   Cholesterol 137 0 - 200   HDL 45 35 - 70   LDL Cholesterol 61   TSH     Status: None   Collection Time: 03/20/18 12:00 AM  Result Value Ref Range   TSH 0.77 0.41 - 5.90    Comment: free t4 1.1, free t3 3.0     On 09/30/2016 she underwent thyroid uptake and scan which showed 26% (normal 10-30 percent) uniform uptake with no nodularity.  Assessment & Plan:    Elevated antithyroid autoantibodies -Her  previous thyroid workup showed elevated antithyroid antibodies putting her at risk for future thyroid dysfunction.  Her previsit thyroid function tests are still consistent with euthyroid state.  She will not require initiation of thyroid hormone replacement at this time.    -She will return in 1 year with repeat thyroid function test.    - I advised patient to maintain close follow up with Wilson Singer, MD for primary care needs. Follow up plan: Return in about 1 year (around 04/17/2019) for Follow up with Pre-visit Labs.  Marquis Lunch, MD Phone: [phone]  Fax: [phone]   04/17/2018, 12:51 PM

## 2018-05-01 ENCOUNTER — Encounter: Payer: Self-pay | Admitting: Women's Health

## 2018-05-01 ENCOUNTER — Ambulatory Visit (INDEPENDENT_AMBULATORY_CARE_PROVIDER_SITE_OTHER): Payer: 59 | Admitting: Women's Health

## 2018-05-01 ENCOUNTER — Other Ambulatory Visit: Payer: Self-pay

## 2018-05-01 ENCOUNTER — Other Ambulatory Visit (HOSPITAL_COMMUNITY)
Admission: RE | Admit: 2018-05-01 | Discharge: 2018-05-01 | Disposition: A | Discharge status: Home / Self Care | Payer: 59 | Source: Ambulatory Visit | Attending: Obstetrics & Gynecology | Admitting: Obstetrics & Gynecology

## 2018-05-01 VITALS — BP 125/81 | HR 84 | Ht 65.0 in | Wt 154.0 lb

## 2018-05-01 DIAGNOSIS — Z01419 Encounter for gynecological examination (general) (routine) without abnormal findings: Secondary | ICD-10-CM | POA: Diagnosis not present

## 2018-05-01 NOTE — Progress Notes (Signed)
   WELL-WOMAN EXAMINATION Patient name: Kimberly Ramsey MRN [id number]  Date of birth: [birth year] Chief Complaint:   Gynecologic Exam  History of Present Illness:   Kimberly Ramsey is a 33 y.o. G45P3003 African American female being seen today for a routine well-woman exam.  Current complaints: none Is a Nurse, mental health now! No longer w/ previous partner  PCP: Karilyn Cota      does not desire labs, just recently done w/ PCP No LMP recorded. (Menstrual status: IUD). The current method of family planning is Mirena IUD inserted 10/03/13 Last pap 10/14/15. Results were: normal Last mammogram: u/s 10/21/15 for palpable ridge. Results were: normal. Family h/o breast cancer: No Last colonoscopy: 07/21/17 d/t hemorrhoids, rectal bleeding, family hx colon ca. Results were: normal ileum, hemorrhoids 3 bands applied. Family h/o colorectal cancer: Yes, father dx @ 45yo Review of Systems:   Pertinent items are noted in HPI Denies any headaches, blurred vision, fatigue, shortness of breath, chest pain, abdominal pain, abnormal vaginal discharge/itching/odor/irritation, problems with periods, bowel movements, urination, or intercourse unless otherwise stated above. Pertinent History Reviewed:  Reviewed past medical,surgical, social and family history.  Reviewed problem list, medications and allergies. Physical Assessment:   Vitals:   05/01/18 1121  BP: 125/81  Pulse: 84  Weight: 154 lb (69.9 kg)  Height: 5\' 5"  (1.651 m)  Body mass index is 25.63 kg/m.        Physical Examination:   General appearance - well appearing, and in no distress  Mental status - alert, oriented to person, place, and time  Psych:  She has a normal mood and affect  Skin - warm and dry, normal color, no suspicious lesions noted  Chest - effort normal, all lung fields clear to auscultation bilaterally  Heart - normal rate and regular rhythm  Neck:  midline trachea, no thyromegaly or nodules  Breasts - breasts appear  normal, no suspicious masses, no skin or nipple changes or  axillary nodes; Rt same palpable ridge noted in 2017, no changes  Abdomen - soft, nontender, nondistended, no masses or organomegaly  Pelvic - VULVA: normal appearing vulva with no masses, tenderness or lesions  VAGINA: normal appearing vagina with normal color and discharge, no lesions  CERVIX: normal appearing cervix without discharge or lesions, no CMT, IUD strings visible  Thin prep pap is done w/ HR HPV cotesting  UTERUS: uterus is felt to be normal size, shape, consistency and nontender   ADNEXA: No adnexal masses or tenderness noted.  Extremities:  No swelling or varicosities noted  No results found for this or any previous visit (from the past 24 hour(s)).  Assessment & Plan:  1) Well-Woman Exam  2) Stable Rt breast ridge> normal u/s 2017  Labs/procedures today: pap  Mammogram @33yo  or sooner if problems Colonoscopy this year per GI d/t father dx @ 47yo  No orders of the defined types were placed in this encounter.   Follow-up: Return for in Aug for IUD removal.  Cheral Marker CNM, [facility] 05/01/2018 12:03 PM

## 2018-05-03 LAB — CYTOLOGY - PAP
CHLAMYDIA, DNA PROBE: NEGATIVE
DIAGNOSIS: NEGATIVE
HPV: NOT DETECTED
NEISSERIA GONORRHEA: NEGATIVE

## 2018-08-01 ENCOUNTER — Ambulatory Visit (INDEPENDENT_AMBULATORY_CARE_PROVIDER_SITE_OTHER): Payer: 59 | Admitting: Women's Health

## 2018-08-01 ENCOUNTER — Encounter: Payer: Self-pay | Admitting: Women's Health

## 2018-08-01 ENCOUNTER — Other Ambulatory Visit: Payer: Self-pay

## 2018-08-01 VITALS — BP 119/79 | HR 81 | Ht 65.0 in | Wt 162.0 lb

## 2018-08-01 DIAGNOSIS — Z30432 Encounter for removal of intrauterine contraceptive device: Secondary | ICD-10-CM | POA: Diagnosis not present

## 2018-08-01 DIAGNOSIS — Z30430 Encounter for insertion of intrauterine contraceptive device: Secondary | ICD-10-CM

## 2018-08-01 MED ORDER — LEVONORGESTREL 19.5 MCG/DAY IU IUD
INTRAUTERINE_SYSTEM | Freq: Once | INTRAUTERINE | Status: AC
  Administered 2018-08-01: 12:00:00 via INTRAUTERINE

## 2018-08-01 NOTE — Addendum Note (Signed)
Addended by: Octaviano Glow on: 08/01/2018 12:34 PM   Modules accepted: Orders

## 2018-08-01 NOTE — Progress Notes (Signed)
   IUD REMOVAL & RE-INSERTION Patient name: Kimberly Ramsey MRN [id number]  Date of birth: @DOB  Subjective Findings:   @Anab  Bellizzi is a 33 y.o. G24P3003 African American female being seen today for removal of a Mirena IUD and insertion of a Liletta IUD. Her IUD was placed 10/03/13.   No LMP recorded. (Menstrual status: IUD). Last pap3/23/20. Results were:  neg w/ -HRHPV  The risks and benefits of the method and placement have been thouroughly reviewed with the patient and all questions were answered.  Specifically the patient is aware of failure rate of 02/998, expulsion of the IUD and of possible perforation.  The patient is aware of irregular bleeding due to the method and understands the incidence of irregular bleeding diminishes with time.  Signed copy of informed consent in chart.  Pertinent History Reviewed:   Reviewed past medical,surgical, social, obstetrical and family history.  Reviewed problem list, medications and allergies. Objective Findings & Procedure:    Vitals:   08/01/18 0840  BP: 119/79  Pulse: 81  Weight: 162 lb (73.5 kg)  Height: 5\' 5"  (1.651 m)  Body mass index is 26.96 kg/m.  No results found for this or any previous visit (from the past 24 hour(s)).   Time out was performed.  A Odom speculum was placed in the vagina.  The cervix was visualized, prepped using Betadine. The strings were visible. They were grasped and the Mirena IUD was easily removed. The cervix was then grasped with a single-tooth tenaculum. The uterus was found to be retroflexed and it sounded to 7 cm.  Liletta IUD placed per manufacturer's recommendations without complications. The strings were trimmed to approximately 3 cm.  The patient tolerated the procedure well.   Informal transvaginal sonogram was performed and the proper placement of the IUD was verified.  Assessment & Plan:   1) Mirena IUD removal & Liletta insertion The patient was given post procedure instructions, including  signs and symptoms of infection and to check for the strings after each menses or each month, and refraining from intercourse or anything in the vagina for 3 days. She was given a Liletta care card with date IUD placed, and date IUD to be removed. She is scheduled for a f/u appointment in 4 weeks.  No orders of the defined types were placed in this encounter.   Follow-up: Return in about 4 weeks (around 08/29/2018) for F/U webex.  Roma Schanz CNM, [facility] 08/01/2018 9:18 AM

## 2018-08-01 NOTE — Patient Instructions (Signed)
 Nothing in vagina for 3 days (no sex, douching, tampons, etc...)  Check your strings once a month to make sure you can feel them, if you are not able to please let us know  If you develop a fever of 100.4 or more in the next few weeks, or if you develop severe abdominal pain, please let us know  Use a backup method of birth control, such as condoms, for 2 weeks    Intrauterine Device Insertion, Care After  This sheet gives you information about how to care for yourself after your procedure. Your health care provider may also give you more specific instructions. If you have problems or questions, contact your health care provider. What can I expect after the procedure? After the procedure, it is common to have:  Cramps and pain in the abdomen.  Light bleeding (spotting) or heavier bleeding that is like your menstrual period. This may last for up to a few days.  Lower back pain.  Dizziness.  Headaches.  Nausea. Follow these instructions at home:  Before resuming sexual activity, check to make sure that you can feel the IUD string(s). You should be able to feel the end of the string(s) below the opening of your cervix. If your IUD string is in place, you may resume sexual activity. ? If you had a hormonal IUD inserted more than 7 days after your most recent period started, you will need to use a backup method of birth control for 7 days after IUD insertion. Ask your health care provider whether this applies to you.  Continue to check that the IUD is still in place by feeling for the string(s) after every menstrual period, or once a month.  Take over-the-counter and prescription medicines only as told by your health care provider.  Do not drive or use heavy machinery while taking prescription pain medicine.  Keep all follow-up visits as told by your health care provider. This is important. Contact a health care provider if:  You have bleeding that is heavier or lasts longer than  a normal menstrual cycle.  You have a fever.  You have cramps or abdominal pain that get worse or do not get better with medicine.  You develop abdominal pain that is new or is not in the same area of earlier cramping and pain.  You feel lightheaded or weak.  You have abnormal or bad-smelling discharge from your vagina.  You have pain during sexual activity.  You have any of the following problems with your IUD string(s): ? The string bothers or hurts you or your sexual partner. ? You cannot feel the string. ? The string has gotten longer.  You can feel the IUD in your vagina.  You think you may be pregnant, or you miss your menstrual period.  You think you may have an STI (sexually transmitted infection). Get help right away if:  You have flu-like symptoms.  You have a fever and chills.  You can feel that your IUD has slipped out of place. Summary  After the procedure, it is common to have cramps and pain in the abdomen. It is also common to have light bleeding (spotting) or heavier bleeding that is like your menstrual period.  Continue to check that the IUD is still in place by feeling for the string(s) after every menstrual period, or once a month.  Keep all follow-up visits as told by your health care provider. This is important.  Contact your health care provider if   you have problems with your IUD string(s), such as the string getting longer or bothering you or your sexual partner. This information is not intended to replace advice given to you by your health care provider. Make sure you discuss any questions you have with your health care provider. Document Released: 09/23/2010 Document Revised: 12/17/2015 Document Reviewed: 12/17/2015 Elsevier Interactive Patient Education  2019 Elsevier Inc.  Levonorgestrel intrauterine device (IUD) What is this medicine? LEVONORGESTREL IUD (LEE voe nor jes trel) is a contraceptive (birth control) device. The device is placed  inside the uterus by a healthcare professional. It is used to prevent pregnancy. This device can also be used to treat heavy bleeding that occurs during your period. This medicine may be used for other purposes; ask your health care provider or pharmacist if you have questions. COMMON BRAND NAME(S): Kyleena, LILETTA, Mirena, Skyla What should I tell my health care provider before I take this medicine? They need to know if you have any of these conditions: -abnormal Pap smear -cancer of the breast, uterus, or cervix -diabetes -endometritis -genital or pelvic infection now or in the past -have more than one sexual partner or your partner has more than one partner -heart disease -history of an ectopic or tubal pregnancy -immune system problems -IUD in place -liver disease or tumor -problems with blood clots or take blood-thinners -seizures -use intravenous drugs -uterus of unusual shape -vaginal bleeding that has not been explained -an unusual or allergic reaction to levonorgestrel, other hormones, silicone, or polyethylene, medicines, foods, dyes, or preservatives -pregnant or trying to get pregnant -breast-feeding How should I use this medicine? This device is placed inside the uterus by a health care professional. Talk to your pediatrician regarding the use of this medicine in children. Special care may be needed. Overdosage: If you think you have taken too much of this medicine contact a poison control center or emergency room at once. NOTE: This medicine is only for you. Do not share this medicine with others. What if I miss a dose? This does not apply. Depending on the brand of device you have inserted, the device will need to be replaced every 3 to 5 years if you wish to continue using this type of birth control. What may interact with this medicine? Do not take this medicine with any of the following medications: -amprenavir -bosentan -fosamprenavir This medicine may also  interact with the following medications: -aprepitant -armodafinil -barbiturate medicines for inducing sleep or treating seizures -bexarotene -boceprevir -griseofulvin -medicines to treat seizures like carbamazepine, ethotoin, felbamate, oxcarbazepine, phenytoin, topiramate -modafinil -pioglitazone -rifabutin -rifampin -rifapentine -some medicines to treat HIV infection like atazanavir, efavirenz, indinavir, lopinavir, nelfinavir, tipranavir, ritonavir -St. John's wort -warfarin This list may not describe all possible interactions. Give your health care provider a list of all the medicines, herbs, non-prescription drugs, or dietary supplements you use. Also tell them if you smoke, drink alcohol, or use illegal drugs. Some items may interact with your medicine. What should I watch for while using this medicine? Visit your doctor or health care professional for regular check ups. See your doctor if you or your partner has sexual contact with others, becomes HIV positive, or gets a sexual transmitted disease. This product does not protect you against HIV infection (AIDS) or other sexually transmitted diseases. You can check the placement of the IUD yourself by reaching up to the top of your vagina with clean fingers to feel the threads. Do not pull on the threads. It is a good habit   to check placement after each menstrual period. Call your doctor right away if you feel more of the IUD than just the threads or if you cannot feel the threads at all. The IUD may come out by itself. You may become pregnant if the device comes out. If you notice that the IUD has come out use a backup birth control method like condoms and call your health care provider. Using tampons will not change the position of the IUD and are okay to use during your period. This IUD can be safely scanned with magnetic resonance imaging (MRI) only under specific conditions. Before you have an MRI, tell your healthcare provider that  you have an IUD in place, and which type of IUD you have in place. What side effects may I notice from receiving this medicine? Side effects that you should report to your doctor or health care professional as soon as possible: -allergic reactions like skin rash, itching or hives, swelling of the face, lips, or tongue -fever, flu-like symptoms -genital sores -high blood pressure -no menstrual period for 6 weeks during use -pain, swelling, warmth in the leg -pelvic pain or tenderness -severe or sudden headache -signs of pregnancy -stomach cramping -sudden shortness of breath -trouble with balance, talking, or walking -unusual vaginal bleeding, discharge -yellowing of the eyes or skin Side effects that usually do not require medical attention (report to your doctor or health care professional if they continue or are bothersome): -acne -breast pain -change in sex drive or performance -changes in weight -cramping, dizziness, or faintness while the device is being inserted -headache -irregular menstrual bleeding within first 3 to 6 months of use -nausea This list may not describe all possible side effects. Call your doctor for medical advice about side effects. You may report side effects to FDA at 1-800-FDA-1088. Where should I keep my medicine? This does not apply. NOTE: This sheet is a summary. It may not cover all possible information. If you have questions about this medicine, talk to your doctor, pharmacist, or health care provider.  2019 Elsevier/Gold Standard (2015-11-07 14:14:56)  

## 2018-08-29 ENCOUNTER — Encounter: Payer: Self-pay | Admitting: Women's Health

## 2018-08-29 ENCOUNTER — Ambulatory Visit (INDEPENDENT_AMBULATORY_CARE_PROVIDER_SITE_OTHER): Payer: 59 | Admitting: Women's Health

## 2018-08-29 ENCOUNTER — Other Ambulatory Visit: Payer: Self-pay

## 2018-08-29 VITALS — Ht 65.0 in | Wt 148.0 lb

## 2018-08-29 DIAGNOSIS — Z30431 Encounter for routine checking of intrauterine contraceptive device: Secondary | ICD-10-CM | POA: Diagnosis not present

## 2018-08-29 NOTE — Progress Notes (Signed)
   [facility] VIRTUAL GYN VISIT ENCOUNTER NOTE Patient name: Kimberly Ramsey MRN [id number]  Date of birth: February 23, 1985  I connected with patient on 08/29/18 at  9:30 AM EDT by phone (unable to download Webex) and verified that I am speaking with the correct person using two identifiers.  Due to COVID-19 recommendations, pt is not currently in the office.    I discussed the limitations, risks, security and privacy concerns of performing an evaluation and management service by telephone and the availability of in person appointments. I also discussed with the patient that there may be a patient responsible charge related to this service. The patient expressed understanding and agreed to proceed.   Chief Complaint:   IUD check  History of Present Illness:   Kimberly Ramsey is a 33 y.o. [id number] African American female being evaluated today for f/u after [place] IUD placement 08/01/18. Doing great, no bleeding, able to feel strings, no pain w/ sex.      No LMP recorded. (Menstrual status: IUD). The current method of family planning is IUD. Last pap 05/01/18. Results were:  neg w/ -HRHPV Review of Systems:   Pertinent items are noted in HPI Denies fever/chills, dizziness, headaches, visual disturbances, fatigue, shortness of breath, chest pain, abdominal pain, vomiting, abnormal vaginal discharge/itching/odor/irritation, problems with periods, bowel movements, urination, or intercourse unless otherwise stated above.  Pertinent History Reviewed:  Reviewed past medical,surgical, social, obstetrical and family history.  Reviewed problem list, medications and allergies. Physical Assessment:   Vitals:   08/29/18 1043  Weight: 148 lb (67.1 kg)  Height: 5\' 5"  (1.651 m)  Body mass index is 24.63 kg/m.       Physical Examination:   General:  Alert, oriented and cooperative.   Mental Status: Normal mood and affect perceived. Normal judgment and thought content.  Physical exam deferred due to nature  of the encounter  No results found for this or any previous visit (from the past 24 hour(s)).  Assessment & Plan:  1) IUD f/u> doing well, check strings monthly  Meds: No orders of the defined types were placed in this encounter.   No orders of the defined types were placed in this encounter.   I discussed the assessment and treatment plan with the patient. The patient was provided an opportunity to ask questions and all were answered. The patient agreed with the plan and demonstrated an understanding of the instructions.   The patient was advised to call back or seek an in-person evaluation/go to the ED if the symptoms worsen or if the condition fails to improve as anticipated.  I provided 10 minutes of non-face-to-face time during this encounter.   Return for prn.  [place], [facility] 08/29/2018 11:03 AM

## 2018-09-15 ENCOUNTER — Other Ambulatory Visit: Payer: Self-pay | Admitting: *Deleted

## 2018-09-15 DIAGNOSIS — Z20822 Contact with and (suspected) exposure to covid-19: Secondary | ICD-10-CM

## 2018-09-16 LAB — NOVEL CORONAVIRUS, NAA: SARS-CoV-2, NAA: NOT DETECTED

## 2018-09-25 ENCOUNTER — Ambulatory Visit (INDEPENDENT_AMBULATORY_CARE_PROVIDER_SITE_OTHER): Payer: 59 | Admitting: Gastroenterology

## 2018-09-25 ENCOUNTER — Other Ambulatory Visit: Payer: Self-pay

## 2018-09-25 ENCOUNTER — Other Ambulatory Visit: Payer: Self-pay | Admitting: Women's Health

## 2018-09-25 ENCOUNTER — Encounter: Payer: Self-pay | Admitting: Gastroenterology

## 2018-09-25 VITALS — BP 116/72 | HR 91 | Temp 97.2°F | Ht 65.0 in | Wt 162.0 lb

## 2018-09-25 DIAGNOSIS — K643 Fourth degree hemorrhoids: Secondary | ICD-10-CM

## 2018-09-25 MED ORDER — HYDROCORTISONE (PERIANAL) 2.5 % EX CREA: 1.0000 "application " | TOPICAL_CREAM | Freq: Two times a day (BID) | CUTANEOUS | 1 refills | Status: DC

## 2018-09-25 MED ORDER — FLUCONAZOLE 150 MG PO TABS: 150.0000 mg | ORAL_TABLET | Freq: Once | ORAL | 0 refills | Status: DC

## 2018-09-25 NOTE — Patient Instructions (Addendum)
I have sent in Anusol to use per rectum twice a day. You may also use over the counter lidocaine ointment per rectum.  Start Benefiber 1 teaspoon daily and increase to 3 teaspoons as tolerated. Avoid straining as you are doing. Limit toilet time to 2-3 minutes at the most.  I have attached a handout on other supportive care for hemorrhoids.   I am referring you to the surgeon to discuss possible surgical repair. Call me if you have any worsening of pain!  It was a pleasure to see you today. I want to create trusting relationships with patients to provide genuine, compassionate, and quality care. I value your feedback. If you receive a survey regarding your visit,  I greatly appreciate you taking time to fill this out.   Kimberly Needs, PhD, ANP-BC [facility]    Hemorrhoids Hemorrhoids are swollen veins in and around the rectum or anus. There are two types of hemorrhoids:  Internal hemorrhoids. These occur in the veins that are just inside the rectum. They may poke through to the outside and become irritated and painful.  External hemorrhoids. These occur in the veins that are outside the anus and can be felt as a painful swelling or hard lump near the anus. Most hemorrhoids do not cause serious problems, and they can be managed with home treatments such as diet and lifestyle changes. If home treatments do not help the symptoms, procedures can be done to shrink or remove the hemorrhoids. What are the causes? This condition is caused by increased pressure in the anal area. This pressure may result from various things, including:  Constipation.  Straining to have a bowel movement.  Diarrhea.  Pregnancy.  Obesity.  Sitting for long periods of time.  Heavy lifting or other activity that causes you to strain.  Anal sex.  Riding a bike for a long period of time. What are the signs or symptoms? Symptoms of this condition include:  Pain.  Anal itching or  irritation.  Rectal bleeding.  Leakage of stool (feces).  Anal swelling.  One or more lumps around the anus. How is this diagnosed? This condition can often be diagnosed through a visual exam. Other exams or tests may also be done, such as:  An exam that involves feeling the rectal area with a gloved hand (digital rectal exam).  An exam of the anal canal that is done using a small tube (anoscope).  A blood test, if you have lost a significant amount of blood.  A test to look inside the colon using a flexible tube with a camera on the end (sigmoidoscopy or colonoscopy). How is this treated? This condition can usually be treated at home. However, various procedures may be done if dietary changes, lifestyle changes, and other home treatments do not help your symptoms. These procedures can help make the hemorrhoids smaller or remove them completely. Some of these procedures involve surgery, and others do not. Common procedures include:  Rubber band ligation. Rubber bands are placed at the base of the hemorrhoids to cut off their blood supply.  Sclerotherapy. Medicine is injected into the hemorrhoids to shrink them.  Infrared coagulation. A type of light energy is used to get rid of the hemorrhoids.  Hemorrhoidectomy surgery. The hemorrhoids are surgically removed, and the veins that supply them are tied off.  Stapled hemorrhoidopexy surgery. The surgeon staples the base of the hemorrhoid to the rectal wall. Follow these instructions at home: Eating and drinking   Eat foods  that have a lot of fiber in them, such as whole grains, beans, nuts, fruits, and vegetables.  Ask your health care provider about taking products that have added fiber (fiber supplements).  Reduce the amount of fat in your diet. You can do this by eating low-fat dairy products, eating less red meat, and avoiding processed foods.  Drink enough fluid to keep your urine pale yellow. Managing pain and  swelling   Take warm sitz baths for 20 minutes, 3-4 times a day to ease pain and discomfort. You may do this in a bathtub or using a portable sitz bath that fits over the toilet.  If directed, apply ice to the affected area. Using ice packs between sitz baths may be helpful. ? Put ice in a plastic bag. ? Place a towel between your skin and the bag. ? Leave the ice on for 20 minutes, 2-3 times a day. General instructions  Take over-the-counter and prescription medicines only as told by your health care provider.  Use medicated creams or suppositories as told.  Get regular exercise. Ask your health care provider how much and what kind of exercise is best for you. In general, you should do moderate exercise for at least 30 minutes on most days of the week (150 minutes each week). This can include activities such as walking, biking, or yoga.  Go to the bathroom when you have the urge to have a bowel movement. Do not wait.  Avoid straining to have bowel movements.  Keep the anal area dry and clean. Use wet toilet paper or moist towelettes after a bowel movement.  Do not sit on the toilet for long periods of time. This increases blood pooling and pain.  Keep all follow-up visits as told by your health care provider. This is important. Contact a health care provider if you have:  Increasing pain and swelling that are not controlled by treatment or medicine.  Difficulty having a bowel movement, or you are unable to have a bowel movement.  Pain or inflammation outside the area of the hemorrhoids. Get help right away if you have:  Uncontrolled bleeding from your rectum. Summary  Hemorrhoids are swollen veins in and around the rectum or anus.  Most hemorrhoids can be managed with home treatments such as diet and lifestyle changes.  Taking warm sitz baths can help ease pain and discomfort.  In severe cases, procedures or surgery can be done to shrink or remove the hemorrhoids. This  information is not intended to replace advice given to you by your health care provider. Make sure you discuss any questions you have with your health care provider. Document Released: 01/23/2000 Document Revised: 02/02/2018 Document Reviewed: 06/16/2017 Elsevier Patient Education  2020 [employer]

## 2018-09-25 NOTE — Progress Notes (Signed)
Referring Provider: Gosrani, Nimish C, MD Primary Care Physician:  Wilson SingerGosrani, Nimish C, MD  Primary GIWilson Singer: Dr. Darrick PennaFields   Chief Complaint  Patient presents with  . Hemorrhoids    painful BM, had bleeding 1 times. Issue x 1 week    HPI:   Kimberly Ramsey is a 33 y.o. female presenting today with a history of internal hemorrhoids s/p banding X 3 last year. Next colonoscopy due in 2024. Presents today with rectal pain and bleeding.  Denies any constipation or straining. About a week or so ago started having rectal pain. Hurts with walking, sitting. States had some swelling at rectum and has to push back in. Had bleeding one time. Has burning, itching, tries to wipe with preparation wipes. Only one time painful with BM. Otherwise, hurting afterward. States pain is better since starting a week ago but still present.   Prior to hemorrhoids flaring, BM a few times a week. Now BM every day. For most part, stool is soft. Might have had one episode of straining about 2 weeks ago.    Past Medical History:  Diagnosis Date  . Cleft lip and palate   . Thrombocytopenia complicating pregnancy (HCC)   . Thyroid disease    followed by Dr. Fransico HimNida    Past Surgical History:  Procedure Laterality Date  . CLEFT PALATE REPAIR    . COLONOSCOPY N/A 07/21/2017   TI normal, torturous colon, internal hemorrhoids s/p banding. External hemorrhoids. Repeat colonoscopy in 5 years.   Marland Kitchen. HEMORRHOID BANDING N/A 07/21/2017   Procedure: [place] LionsHEMORRHOID BANDING;  Surgeon: West BaliFields, Sandi L, MD;  Location: [facility];  Service: Endoscopy;  Laterality: N/A;  . HIP SURGERY     took bone from hip to fix cleft palate.    Current Outpatient Medications  Medication Sig Dispense Refill  . levonorgestrel (LILETTA, 52 MG,) 19.5 MCG/DAY IUD IUD 1 each by Intrauterine route once.    . hydrocortisone (ANUSOL-HC) 2.5 % rectal cream Place 1 application rectally 2 (two) times daily. 30 g 1   No current facility-administered  medications for this visit.     Allergies as of 09/25/2018 - Review Complete 09/25/2018  Allergen Reaction Noted  . Codeine Nausea And Vomiting 12/27/2012    Family History  Problem Relation Age of Onset  . Mental illness Mother   . Schizophrenia Mother   . Lupus Father   . Colon cancer Father [id number]       surgery/chemo  . Colon polyps Neg Hx     Social History   Socioeconomic History  . Marital status: Single    Spouse name: Not on file  . Number of children: Not on file  . Years of education: Not on file  . Highest education level: Not on file  Occupational History  . Not on file  Social Needs  . Financial resource strain: Not on file  . Food insecurity    Worry: Not on file    Inability: Not on file  . Transportation needs    Medical: Not on file    Non-medical: Not on file  Tobacco Use  . Smoking status: Never Smoker  . Smokeless tobacco: Never Used  Substance and Sexual Activity  . Alcohol use: No  . Drug use: No  . Sexual activity: Yes    Birth control/protection: I.U.D.  Lifestyle  . Physical activity    Days per week: Not on file    Minutes per session: Not on file  . Stress:  Not on file  Relationships  . Social Herbalist on phone: Not on file    Gets together: Not on file    Attends religious service: Not on file    Active member of club or organization: Not on file    Attends meetings of clubs or organizations: Not on file    Relationship status: Not on file  Other Topics Concern  . Not on file  Social History Narrative  . Not on file    Review of Systems: Gen: Denies fever, chills, anorexia. Denies fatigue, weakness, weight loss.  CV: Denies chest pain, palpitations, syncope, peripheral edema, and claudication. Resp: Denies dyspnea at rest, cough, wheezing, coughing up blood, and pleurisy. GI: see HPI Derm: Denies rash, itching, dry skin Psych: Denies depression, anxiety, memory loss, confusion. No homicidal or suicidal ideation.   Heme: Denies bruising, bleeding, and enlarged lymph nodes.  Physical Exam: BP 116/72   Pulse 91   Temp (!) 97.2 F (36.2 C) (Oral)   Ht 5\' 5"  (1.651 m)   Wt 162 lb (73.5 kg)   LMP 09/11/2018   BMI 26.96 kg/m  General:   Alert and oriented. No distress noted. Pleasant and cooperative.  Head:  Normocephalic and atraumatic. Eyes:  Conjuctiva clear without scleral icterus. Abdomen:  +BS, soft, non-tender and non-distended. No rebound or guarding. No HSM or masses noted. Rectal: external hemorrhoid tag. Small, pea-sized prolapsing internal hemorrhoid, able to manually reduce but does not remain and prolapses out. Unable to do complete DRE due to discomfort.  Msk:  Symmetrical without gross deformities. Normal posture. Extremities:  Without edema. Neurologic:  Alert and  oriented x4 Psych:  Alert and cooperative. Normal mood and affect.

## 2018-09-25 NOTE — Assessment & Plan Note (Signed)
Very pleasant 33 year old female presenting with over a week long history of rectal pain, with findings of Grade 3 internal hemorrhoid and likely subacute thrombosis. Discomfort has improved since onset, and needs to maximize supportive measures. Prior hemorrhoid banding X 3 last year. I was able to digitally reduce the small internal hemorrhoid, but this prolapses easily again. Unable to complete full DRE. Referring to Gen Surg and adding supportive measures including creams, sitz baths, maximize bowel regimen, call if worsening discomfort.

## 2018-09-26 NOTE — Progress Notes (Signed)
cc'ed to pcp °

## 2018-10-17 ENCOUNTER — Other Ambulatory Visit: Payer: Self-pay

## 2018-10-17 DIAGNOSIS — Z20822 Contact with and (suspected) exposure to covid-19: Secondary | ICD-10-CM

## 2018-10-19 LAB — NOVEL CORONAVIRUS, NAA: SARS-CoV-2, NAA: NOT DETECTED

## 2019-01-15 ENCOUNTER — Other Ambulatory Visit: Payer: Self-pay | Admitting: Women's Health

## 2019-03-05 ENCOUNTER — Ambulatory Visit: Payer: 59 | Attending: Internal Medicine

## 2019-03-05 DIAGNOSIS — Z20822 Contact with and (suspected) exposure to covid-19: Secondary | ICD-10-CM

## 2019-03-06 LAB — NOVEL CORONAVIRUS, NAA: SARS-CoV-2, NAA: NOT DETECTED

## 2019-03-22 ENCOUNTER — Encounter (INDEPENDENT_AMBULATORY_CARE_PROVIDER_SITE_OTHER): Payer: Self-pay | Admitting: Internal Medicine

## 2019-03-22 ENCOUNTER — Ambulatory Visit (INDEPENDENT_AMBULATORY_CARE_PROVIDER_SITE_OTHER): Payer: 59 | Admitting: Internal Medicine

## 2019-03-22 ENCOUNTER — Other Ambulatory Visit: Payer: Self-pay

## 2019-03-22 VITALS — BP 130/82 | HR 88 | Temp 98.4°F | Resp 18 | Ht 65.0 in | Wt 157.0 lb

## 2019-03-22 DIAGNOSIS — Z87730 Personal history of (corrected) cleft lip and palate: Secondary | ICD-10-CM | POA: Diagnosis not present

## 2019-03-22 DIAGNOSIS — E559 Vitamin D deficiency, unspecified: Secondary | ICD-10-CM

## 2019-03-22 DIAGNOSIS — Z0000 Encounter for general adult medical examination without abnormal findings: Secondary | ICD-10-CM

## 2019-03-22 DIAGNOSIS — E063 Autoimmune thyroiditis: Secondary | ICD-10-CM

## 2019-03-22 NOTE — Progress Notes (Signed)
Chief Complaint: This delightful 34 year old lady comes in for an annual physical exam. HPI: She is doing very well. She follows endocrinology for Hashimoto's thyroiditis.  She has no specific complaints today. She has some questions regarding COVID-19 vaccination today. She has not taken influenza vaccination and at this late time in the season, she declined.  Past Medical History:  Diagnosis Date  . Cleft lip and palate   . Thrombocytopenia complicating pregnancy ([place])   . Thyroid disease    followed by Dr. Dorris Fetch   Past Surgical History:  Procedure Laterality Date  . CLEFT PALATE REPAIR    . COLONOSCOPY N/A 07/21/2017   TI normal, torturous colon, internal hemorrhoids s/p banding. External hemorrhoids. Repeat colonoscopy in 5 years.   Marland Kitchen HEMORRHOID BANDING N/A 07/21/2017   Procedure: [place];  Surgeon: Danie Binder, MD;  [facility];  Service: Endoscopy;  Laterality: N/A;  . HIP SURGERY     took bone from hip to fix cleft palate.     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    Social History   Tobacco Use  . Smoking status: Never Smoker  . Smokeless tobacco: Never Used  Substance Use Topics  . Alcohol use: No      Allergies:  Allergies  Allergen Reactions  . Codeine Nausea And Vomiting     Current Meds  Medication Sig  . Cholecalciferol (VITAMIN D-3) 125 MCG (5000 UT) TABS Take 1 tablet by mouth daily.  Marland Kitchen levonorgestrel (LILETTA, 52 MG,) 19.5 MCG/DAY IUD IUD 1 each by Intrauterine route once.  . [DISCONTINUED] hydrocortisone (ANUSOL-HC) 2.5 % rectal cream Place 1 application rectally 2 (two) times daily.  . [DISCONTINUED] valACYclovir (VALTREX) 1000 MG tablet TAKE 1 TABLET DAILY FOR 5 DAYS.      UXL:KGMWN from the symptoms mentioned above,there are no other symptoms referable to all systems reviewed.  Physical Exam: Blood pressure  130/82, pulse 88, temperature 98.4 F (36.9 C), temperature source Temporal, resp. rate 18, height 5\' 5"  (1.651 m), weight 157 lb (71.2 kg), SpO2 98 %. Vitals with BMI 03/22/2019 09/25/2018 08/29/2018  Height 5\' 5"  5\' 5"  5\' 5"   Weight 157 lbs 162 lbs 148 lbs  BMI 26.13 02.72 53.66  Systolic 440 347 -  Diastolic 82 72 -  Pulse 88 91 -      She looks systemically well.  General: Alert, cooperative, and appears to be the stated age.No pallor.  No jaundice.  No clubbing. Head: Normocephalic Eyes: Sclera white, pupils equal and reactive to light, red reflex x 2,  Ears: Normal bilaterally Oral cavity: Lips, mucosa, and tongue normal: Teeth and gums normal Neck: No adenopathy, supple, symmetrical, trachea midline, and thyroid does not appear enlarged. Breast: No masses felt. Respiratory: Clear to auscultation bilaterally.No wheezing, crackles or bronchial breathing. Cardiovascular: Heart sounds are present and appear to be normal without murmurs or added sounds.  No carotid bruits.  Peripheral pulses are present and equal bilaterally.: Gastrointestinal:positive bowel sounds, no hepatosplenomegaly.  No masses felt.No tenderness. Skin: Clear, No rashes noted.No worrisome skin lesions seen. Neurological: Grossly intact without focal findings, cranial nerves II through XII intact, muscle strength equal bilaterally Musculoskeletal: No acute joint abnormalities noted.Full range of movement noted with joints. Psychiatric: Affect appropriate, non-anxious.    Assessment  1. Hashimoto's thyroiditis   2. H/O cleft lip/palate   3. Routine general medical examination at a health care facility  4. Vitamin D deficiency disease     Tests Ordered:   Orders Placed This Encounter  Procedures  . CBC  . COMPLETE METABOLIC PANEL WITH GFR  . Lipid panel  . T3, free  . T4  . TSH  . VITAMIN D 25 Hydroxy (Vit-D Deficiency, Fractures)     Plan  1. Healthy 34 year old lady. 2. Blood work is ordered  as above. 3. Further recommendations will depend on blood results and I will see her in 1 year for an annual physical exam.     No orders of the defined types were placed in this encounter.     C    03/22/2019, 9:50 AM

## 2019-03-23 LAB — LIPID PANEL
Cholesterol: 145 mg/dL (ref <200)
HDL: 55 mg/dL (ref >=50)
LDL Cholesterol (Calc): 72 mg/dL (calc)
Non-HDL Cholesterol (Calc): 90 mg/dL (calc) (ref <130)
Total CHOL/HDL Ratio: 2.6 (calc) (ref <5.0)
Triglycerides: 98 mg/dL (ref <150)

## 2019-03-23 LAB — COMPLETE METABOLIC PANEL WITH GFR
AG Ratio: 1.4 (calc) (ref 1.0–2.5)
ALT: 27 U/L (ref 6–29)
AST: 21 U/L (ref 10–30)
Albumin: 4.5 g/dL (ref 3.6–5.1)
Alkaline phosphatase (APISO): 116 U/L (ref 31–125)
BUN: 11 mg/dL (ref 7–25)
CO2: 30 mmol/L (ref 20–32)
Calcium: 9.8 mg/dL (ref 8.6–10.2)
Chloride: 101 mmol/L (ref 98–110)
Creat: 0.77 mg/dL (ref 0.50–1.10)
GFR, Est African American: 119 mL/min/{1.73_m2} (ref >=60)
GFR, Est Non African American: 103 mL/min/{1.73_m2} (ref >=60)
Globulin: 3.3 g/dL (calc) (ref 1.9–3.7)
Glucose, Bld: 76 mg/dL (ref 65–99)
Potassium: 4.3 mmol/L (ref 3.5–5.3)
Sodium: 138 mmol/L (ref 135–146)
Total Bilirubin: 0.5 mg/dL (ref 0.2–1.2)
Total Protein: 7.8 g/dL (ref 6.1–8.1)

## 2019-03-23 LAB — CBC
HCT: 46.6 % — ABNORMAL HIGH (ref 35.0–45.0)
Hemoglobin: 15.5 g/dL (ref 11.7–15.5)
MCH: 28.3 pg (ref 27.0–33.0)
MCHC: 33.3 g/dL (ref 32.0–36.0)
MCV: 85.2 fL (ref 80.0–100.0)
MPV: 12.6 fL — ABNORMAL HIGH (ref 7.5–12.5)
Platelets: 152 10*3/uL (ref 140–400)
RBC: 5.47 10*6/uL — ABNORMAL HIGH (ref 3.80–5.10)
RDW: 13.3 % (ref 11.0–15.0)
WBC: 6.9 10*3/uL (ref 3.8–10.8)

## 2019-03-23 LAB — TSH: TSH: 0.95 mIU/L

## 2019-03-23 LAB — VITAMIN D 25 HYDROXY (VIT D DEFICIENCY, FRACTURES): Vit D, 25-Hydroxy: 38 ng/mL (ref 30–100)

## 2019-03-23 LAB — T4: T4, Total: 7.4 ug/dL (ref 5.1–11.9)

## 2019-03-23 LAB — T3, FREE: T3, Free: 3.3 pg/mL (ref 2.3–4.2)

## 2019-04-17 ENCOUNTER — Other Ambulatory Visit: Payer: Self-pay

## 2019-04-17 ENCOUNTER — Ambulatory Visit (INDEPENDENT_AMBULATORY_CARE_PROVIDER_SITE_OTHER): Payer: 59 | Admitting: "Endocrinology

## 2019-04-17 ENCOUNTER — Encounter: Payer: Self-pay | Admitting: "Endocrinology

## 2019-04-17 VITALS — BP 125/86 | HR 73 | Ht 65.0 in | Wt 159.0 lb

## 2019-04-17 DIAGNOSIS — E559 Vitamin D deficiency, unspecified: Secondary | ICD-10-CM | POA: Diagnosis not present

## 2019-04-17 DIAGNOSIS — E063 Autoimmune thyroiditis: Secondary | ICD-10-CM | POA: Diagnosis not present

## 2019-04-17 NOTE — Progress Notes (Signed)
04/17/2019  Endocrinology follow-up note   Subjective:    Patient ID: Kimberly Ramsey, female    DOB: December 14, 1985, PCP Wilson Singer, MD   Past Medical History:  Diagnosis Date  . Cleft lip and palate   . Thrombocytopenia complicating pregnancy ([facility])   . Thyroid disease    followed by Dr. Fransico Him   Past Surgical History:  Procedure Laterality Date  . CLEFT PALATE REPAIR    . COLONOSCOPY N/A 07/21/2017   TI normal, torturous colon, internal hemorrhoids s/p banding. External hemorrhoids. Repeat colonoscopy in 5 years.   Marland Kitchen HEMORRHOID BANDING N/A 07/21/2017   Procedure: [place];  Surgeon: West Bali, MD;  [facility];  Service: Endoscopy;  Laterality: N/A;  . HIP SURGERY     took bone from hip to fix cleft palate.   Social History   Socioeconomic History  . Marital status: Single    Spouse name: Not on file  . Number of children: Not on file  . Years of education: Not on file  . Highest education level: Not on file  Occupational History  . Not on file  Tobacco Use  . Smoking status: Never Smoker  . Smokeless tobacco: Never Used  Substance and Sexual Activity  . Alcohol use: No  . Drug use: No  . Sexual activity: Yes    Birth control/protection: I.U.D.  Other Topics Concern  . 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   Social Determinants of Health   Financial Resource Strain:   . Difficulty of Paying Living Expenses: Not on file  Food Insecurity:   . Worried About Programme researcher, broadcasting/film/video in the Last Year: Not on file  . Ran Out of Food in the Last Year: Not on file  Transportation Needs:   . Lack of Transportation (Medical): Not on file  . Lack of Transportation (Non-Medical): Not on file  Physical Activity:   . Days of Exercise per Week: Not on file  . Minutes of Exercise per Session: Not on file  Stress:   . Feeling of Stress : Not on file   Social Connections:   . Frequency of Communication with Friends and Family: Not on file  . Frequency of Social Gatherings with Friends and Family: Not on file  . Attends Religious Services: Not on file  . Active Member of Clubs or Organizations: Not on file  . Attends Banker Meetings: Not on file  . Marital Status: Not on file   Outpatient Encounter Medications as of 04/17/2019  Medication Sig  . Cholecalciferol (VITAMIN D-3) 125 MCG (5000 UT) TABS Take 1 tablet by mouth daily.  Marland Kitchen levonorgestrel (LILETTA, 52 MG,) 19.5 MCG/DAY IUD IUD 1 each by Intrauterine route once.   No facility-administered encounter medications on file as of 04/17/2019.   ALLERGIES: Allergies  Allergen Reactions  . Codeine Nausea And Vomiting    VACCINATION STATUS: Immunization History  Administered Date(s) Administered  . Tdap 08/18/2013    HPI 34 year old female patient with medical history as above. She is being seen in follow-up with repeat thyroid function tests after she was seen in consultation for abnormal antithyroid antibodies.   -She has no new complaints today.  She is not on antithyroid medications nor thyroid hormone supplements. -She did have unremarkable thyroid uptake and scan in 2018.    - She denies palpitations, heat intolerance, tremors.  She has a steady  weight since last visit.    She has normal menstrual cycles, mother of 3 children. She denies any family history of thyroid dysfunction. - She denies dysphagia, shortness of breath, voice change.   Review of Systems  Review of systems  Constitutional: + Minimally fluctuating body weight,  current  Body mass index is 26.46 kg/m. , no fatigue, no subjective hyperthermia, no subjective hypothermia Eyes: no blurry vision, no xerophthalmia ENT: no sore throat, no nodules palpated in throat, no dysphagia/odynophagia, no hoarseness Cardiovascular: no Chest Pain, no Shortness of Breath, no palpitations, no leg  swelling Respiratory: no cough, no shortness of breath Gastrointestinal: no Nausea/Vomiting/Diarhhea Musculoskeletal: no muscle/joint aches Skin: no rashes, no hyperemia Neurological: no tremors, no numbness, no tingling, no dizziness Psychiatric: no depression, no anxiety   Objective:    BP 125/86   Pulse 73   Ht 5\' 5"  (1.651 m)   Wt 159 lb (72.1 kg)   BMI 26.46 kg/m   Wt Readings from Last 3 Encounters:  04/17/19 159 lb (72.1 kg)  03/22/19 157 lb (71.2 kg)  09/25/18 162 lb (73.5 kg)    Physical Exam  Constitutional: + Overweight for height, not in acute distress, normal state of mind.    Eyes: PERRLA, EOMI, no exophthalmos ENT:  + Repaired cleft lip,  moist mucous membranes, + no gross thyromegaly,  no cervical lymphadenopathy.   Musculoskeletal: no gross deformities, strength intact in all four extremities Skin: moist, warm, no rashes Neurological: No tremors.  Recent Results (from the past 2160 hour(s))  Novel Coronavirus, NAA ([facility])     Status: None   Collection Time: 03/05/19  9:28 AM   Specimen: Nasopharyngeal(NP) swabs in vial transport medium   NASOPHARYNGE  TESTING  Result Value Ref Range   SARS-CoV-2, NAA Not Detected Not Detected    Comment: This nucleic acid amplification test was developed and its performance characteristics determined by 03/07/19. Nucleic acid amplification tests include RT-PCR and TMA. This test has not been FDA cleared or approved. This test has been authorized by FDA under an Emergency Use Authorization (EUA). This test is only authorized for the duration of time the declaration that circumstances exist justifying the authorization of the emergency use of in vitro diagnostic tests for detection of SARS-CoV-2 virus and/or diagnosis of COVID-19 infection under section 564(b)(1) of the Act, 21 U.S.C. [employer]) (1), unless the authorization is terminated or revoked sooner. When diagnostic testing is negative, the  possibility of a false negative result should be considered in the context of a patient's recent exposures and the presence of clinical signs and symptoms consistent with COVID-19. An individual without symptoms of COVID-19 and who is not shedding SARS-CoV-2 virus wo uld expect to have a negative (not detected) result in this assay.   CBC     Status: Abnormal   Collection Time: 03/22/19  9:52 AM  Result Value Ref Range   WBC 6.9 3.8 - 10.8 Thousand/uL   RBC 5.47 (H) 3.80 - 5.10 Million/uL   Hemoglobin 15.5 11.7 - 15.5 g/dL   HCT 05/20/19 (H) 35.5 - 73.2 %   MCV 85.2 80.0 - 100.0 fL   MCH 28.3 27.0 - 33.0 pg   MCHC 33.3 32.0 - 36.0 g/dL   RDW 20.2 54.2 - 70.6 %   Platelets 152 140 - 400 Thousand/uL   MPV 12.6 (H) 7.5 - 12.5 fL  COMPLETE METABOLIC PANEL WITH GFR     Status: None   Collection Time: 03/22/19  9:52 AM  Result Value Ref Range   Glucose, Bld 76 65 - 99 mg/dL    Comment: .            Fasting reference interval .    BUN 11 7 - 25 mg/dL   Creat 0.77 0.50 - 1.10 mg/dL   GFR, Est Non African American 103 > OR = 60 mL/min/1.30m2   GFR, Est African American 119 > OR = 60 mL/min/1.71m2   BUN/Creatinine Ratio NOT APPLICABLE 6 - 22 (calc)   Sodium 138 135 - 146 mmol/L   Potassium 4.3 3.5 - 5.3 mmol/L   Chloride 101 98 - 110 mmol/L   CO2 30 20 - 32 mmol/L   Calcium 9.8 8.6 - 10.2 mg/dL   Total Protein 7.8 6.1 - 8.1 g/dL   Albumin 4.5 3.6 - 5.1 g/dL   Globulin 3.3 1.9 - 3.7 g/dL (calc)   AG Ratio 1.4 1.0 - 2.5 (calc)   Total Bilirubin 0.5 0.2 - 1.2 mg/dL   Alkaline phosphatase (APISO) 116 31 - 125 U/L   AST 21 10 - 30 U/L   ALT 27 6 - 29 U/L  Lipid panel     Status: None   Collection Time: 03/22/19  9:52 AM  Result Value Ref Range   Cholesterol 145 <200 mg/dL   HDL 55 > OR = 50 mg/dL   Triglycerides 98 <150 mg/dL   LDL Cholesterol (Calc) 72 mg/dL (calc)    Comment: Reference range: <100 . Desirable range <100 mg/dL for primary prevention;   <70 mg/dL for patients  with CHD or diabetic patients  with > or = 2 CHD risk factors. Marland Kitchen LDL-C is now calculated using the Martin-Hopkins  calculation, which is a validated novel method providing  better accuracy than the Friedewald equation in the  estimation of LDL-C.  Cresenciano Genre et al. Annamaria Helling. 7782;423(53): 2061-2068  (http://education.[facility].com/faq/FAQ164)    Total CHOL/HDL Ratio 2.6 <5.0 (calc)   Non-HDL Cholesterol (Calc) 90 <130 mg/dL (calc)    Comment: For patients with diabetes plus 1 major ASCVD risk  factor, treating to a non-HDL-C goal of <100 mg/dL  (LDL-C of <70 mg/dL) is considered a therapeutic  option.   T3, free     Status: None   Collection Time: 03/22/19  9:52 AM  Result Value Ref Range   T3, Free 3.3 2.3 - 4.2 pg/mL  T4     Status: None   Collection Time: 03/22/19  9:52 AM  Result Value Ref Range   T4, Total 7.4 5.1 - 11.9 mcg/dL  TSH     Status: None   Collection Time: 03/22/19  9:52 AM  Result Value Ref Range   TSH 0.95 mIU/L    Comment:           Reference Range .           > or = 20 Years  0.40-4.50 .                Pregnancy Ranges           First trimester    0.26-2.66           Second trimester   0.55-2.73           Third trimester    0.43-2.91   VITAMIN D 25 Hydroxy (Vit-D Deficiency, Fractures)     Status: None   Collection Time: 03/22/19  9:52 AM  Result Value Ref Range   Vit D, 25-Hydroxy 38 30 - 100 ng/mL  Comment: Vitamin D Status         25-OH Vitamin D: . Deficiency:                    <20 ng/mL Insufficiency:             20 - 29 ng/mL Optimal:                 > or = 30 ng/mL . For 25-OH Vitamin D testing on patients on  D2-supplementation and patients for whom quantitation  of D2 and D3 fractions is required, the [facility](TM) 25-OH VIT D, (D2,D3), LC/MS/MS is recommended: order  code 62263 (patients >45yrs). See Note 1 . Note 1 . For additional information, please refer to  http://education.[facility].com/faq/FAQ199  (This  link is being provided for informational/ educational purposes only.)      On 09/30/2016 she underwent thyroid uptake and scan which showed 26% (normal 10-30 percent) uniform uptake with no nodularity.  Assessment & Plan:    1. Elevated antithyroid autoantibodies -Her previsit thyroid function tests are still consistent with euthyroid state, she has previously documented elevated antithyroid antibodies  putting her at risk for future thyroid dysfunction.  She would not need intervention with thyroid hormone nor antithyroid medication at this time.      2. Vitamin D deficiency She is advised to continue her current supplement with vitamin D3 5000 units daily until next measurement.  -She will return in 1 year with repeat thyroid function test.    - I advised patient to maintain close follow up with Wilson Singer, MD for primary care needs.     - Time spent on this patient care encounter:  20 minutes of which 50% was spent in  counseling and the rest reviewing  her current and  previous labs / studies and medications  doses and developing a plan for long term care. Kimberly Ramsey  participated in the discussions, expressed understanding, and voiced agreement with the above plans.  All questions were answered to her satisfaction. she is encouraged to contact clinic should she have any questions or concerns prior to her return visit.  Follow up plan: Return in about 1 year (around 04/16/2020) for Follow up with Pre-visit Labs.  Marquis Lunch, MD Phone: [phone]  Fax: [phone]   04/17/2019, 12:48 PM

## 2020-01-08 ENCOUNTER — Telehealth: Payer: Self-pay | Admitting: Adult Health

## 2020-01-08 NOTE — Telephone Encounter (Signed)
Please advise pt as to when she's due for her next PAP

## 2020-01-08 NOTE — Telephone Encounter (Signed)
Pt aware her last pap was 05/01/18. No history of abnormal paps in chart. Can have next pap in 2023 but needs a yearly physical. Pt voiced understanding and call was transferred to [facility] for appt. JSY

## 2020-03-26 ENCOUNTER — Encounter (INDEPENDENT_AMBULATORY_CARE_PROVIDER_SITE_OTHER): Payer: Self-pay | Admitting: Internal Medicine

## 2020-03-26 ENCOUNTER — Other Ambulatory Visit: Payer: Self-pay

## 2020-03-26 ENCOUNTER — Ambulatory Visit (INDEPENDENT_AMBULATORY_CARE_PROVIDER_SITE_OTHER): Payer: 59 | Admitting: Internal Medicine

## 2020-03-26 VITALS — BP 110/70 | HR 60 | Temp 96.0°F | Ht 65.0 in | Wt 158.4 lb

## 2020-03-26 DIAGNOSIS — E063 Autoimmune thyroiditis: Secondary | ICD-10-CM

## 2020-03-26 DIAGNOSIS — E559 Vitamin D deficiency, unspecified: Secondary | ICD-10-CM | POA: Diagnosis not present

## 2020-03-26 NOTE — Progress Notes (Signed)
VIT 

## 2020-03-26 NOTE — Progress Notes (Signed)
Metrics: Intervention Frequency ACO  Documented Smoking Status Yearly  Screened one or more times in 24 months  Cessation Counseling or  Active cessation medication Past 24 months  Past 24 months   Guideline developer: UpToDate (See UpToDate for funding source) Date Released: 2014       Wellness Office Visit  Subjective:  Patient ID: Kimberly Ramsey, female    DOB: 1985-09-02  Age: 35 y.o. MRN: [id number]  CC: This lady was due to come for an annual physical exam but time were short and we will reschedule this with Maralyn Sago next time. HPI  She is doing well and is being followed by Dr. Fransico Him, endocrinology for possible thyroid dysfunction.  She has a history of Hashimoto's thyroiditis in the past apparently.  She also has vitamin D deficiency and takes vitamin D3 supplementation. She has no specific complaints today. Past Medical History:  Diagnosis Date  . Cleft lip and palate   . Thrombocytopenia complicating pregnancy ([facility])   . Thyroid disease    followed by Dr. Fransico Him   Past Surgical History:  Procedure Laterality Date  . CLEFT PALATE REPAIR    . COLONOSCOPY N/A 07/21/2017   TI normal, torturous colon, internal hemorrhoids s/p banding. External hemorrhoids. Repeat colonoscopy in 5 years.   Marland Kitchen HEMORRHOID BANDING N/A 07/21/2017   Procedure: [place];  Surgeon: West Bali, MD;  [facility];  Service: Endoscopy;  Laterality: N/A;  . HIP SURGERY     took bone from hip to fix cleft palate.     Family History  Problem Relation Age of Onset  . Mental illness Mother   . Schizophrenia Mother   . Lupus Father   . Colon cancer Father 43       surgery/chemo  . 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   Social History   Tobacco Use  . Smoking status: Never Smoker  . Smokeless tobacco: Never Used  Substance Use Topics  . Alcohol use:  No    Current Meds  Medication Sig  . Cholecalciferol (VITAMIN D-3) 125 MCG (5000 UT) TABS Take 1 tablet by mouth daily.  Marland Kitchen levonorgestrel (LILETTA, 52 MG,) 19.5 MCG/DAY IUD IUD 1 each by Intrauterine route once.       Objective:   Today's Vitals: BP 110/70 (BP Location: Right Arm, Patient Position: Sitting, Cuff Size: Normal)   Pulse 60   Temp (!) 96 F (35.6 C) (Temporal)   Ht 5\' 5"  (1.651 m)   Wt 158 lb 6.4 oz (71.8 kg)   LMP 03/23/2020   SpO2 99%   BMI 26.36 kg/m  Vitals with BMI 03/26/2020 04/17/2019 03/22/2019  Height 5\' 5"  5\' 5"  5\' 5"   Weight 158 lbs 6 oz 159 lbs 157 lbs  BMI 26.36 26.46 26.13  Systolic 110 125 05/20/2019  Diastolic 70 86 82  Pulse 60 73 88     Physical Exam  She looks systemically well, overweight.  Blood pressure is excellent.     Assessment   1. Vitamin D deficiency disease   2. Hashimoto's thyroiditis       Tests ordered No orders of the defined types were placed in this encounter.    Plan: 1. She will continue with vitamin D3 supplementation as before. 2. She will follow up with Dr. next month and get blood work that he is requested prior  to the visit. 3. Follow-up with Maralyn Sago in about 3 to 4 months for an annual physical exam.   No orders of the defined types were placed in this encounter.   Wilson Singer, MD

## 2020-03-27 LAB — T4, FREE: Free T4: 1.33 ng/dL (ref 0.82–1.77)

## 2020-03-27 LAB — VITAMIN D 25 HYDROXY (VIT D DEFICIENCY, FRACTURES): Vit D, 25-Hydroxy: 89 ng/mL (ref 30.0–100.0)

## 2020-03-27 LAB — T3, FREE: T3, Free: 3.6 pg/mL (ref 2.0–4.4)

## 2020-03-27 LAB — TSH: TSH: 0.657 u[IU]/mL (ref 0.450–4.500)

## 2020-04-16 ENCOUNTER — Other Ambulatory Visit: Payer: Self-pay

## 2020-04-16 ENCOUNTER — Ambulatory Visit: Payer: 59 | Admitting: "Endocrinology

## 2020-04-16 ENCOUNTER — Encounter: Payer: Self-pay | Admitting: "Endocrinology

## 2020-04-16 VITALS — BP 112/74 | HR 84 | Ht 65.0 in | Wt 160.8 lb

## 2020-04-16 DIAGNOSIS — E063 Autoimmune thyroiditis: Secondary | ICD-10-CM | POA: Diagnosis not present

## 2020-04-16 DIAGNOSIS — E559 Vitamin D deficiency, unspecified: Secondary | ICD-10-CM

## 2020-04-16 NOTE — Progress Notes (Signed)
04/16/2020  Endocrinology follow-up note   Subjective:    Patient ID: Kimberly Ramsey, female    DOB: [birth year], PCP Elenore Paddy, NP   Past Medical History:  Diagnosis Date  . Cleft lip and palate   . Thrombocytopenia complicating pregnancy (HCC)   . Thyroid disease    followed by Dr. Fransico Him   Past Surgical History:  Procedure Laterality Date  . CLEFT PALATE REPAIR    . COLONOSCOPY N/A 07/21/2017   TI normal, torturous colon, internal hemorrhoids s/p banding. External hemorrhoids. Repeat colonoscopy in 5 years.   Marland Kitchen HEMORRHOID BANDING N/A 07/21/2017   Procedure: Leith Lions;  Surgeon: West Bali, MD;  Location: [facility];  Service: Endoscopy;  Laterality: N/A;  . HIP SURGERY     took bone from hip to fix cleft palate.   Social History   Socioeconomic History  . Marital status: Single    Spouse name: Not on file  . Number of children: Not on file  . Years of education: Not on file  . Highest education level: Not on file  Occupational History  . Not on file  Tobacco Use  . Smoking status: Never Smoker  . Smokeless tobacco: Never Used  Vaping Use  . Vaping Use: Never used  Substance and Sexual Activity  . Alcohol use: No  . Drug use: No  . Sexual activity: Yes    Birth control/protection: I.U.D.  Other Topics Concern  . 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   Social Determinants of Health   Financial Resource Strain: Not on file  Food Insecurity: Not on file  Transportation Needs: Not on file  Physical Activity: Not on file  Stress: Not on file  Social Connections: Not on file   Outpatient Encounter Medications as of 04/16/2020  Medication Sig  . levonorgestrel (LILETTA, 52 MG,) 19.5 MCG/DAY IUD IUD 1 each by Intrauterine route once.  . [DISCONTINUED] Cholecalciferol (VITAMIN D-3) 125 MCG (5000 UT) TABS Take 1 tablet by mouth daily.   No  facility-administered encounter medications on file as of 04/16/2020.   ALLERGIES: Allergies  Allergen Reactions  . Codeine Nausea And Vomiting    VACCINATION STATUS: Immunization History  Administered Date(s) Administered  . Tdap 08/18/2013    HPI 35 year old female patient with medical history as above. She is being seen in follow-up with repeat thyroid function tests after she was seen in consultation for Hashimoto's thyroiditis.  She is not currently on thyroid hormone supplement or antithyroid intervention.  She has no new complaints.   -She did have unremarkable thyroid uptake and scan in 2018.    - She denies palpitations, heat intolerance, tremors.  She has a steady weight since last visit.    She has normal menstrual cycles, mother of 3 children. She denies any family history of thyroid dysfunction. - She denies dysphagia, shortness of breath, voice change. She is on vitamin D supplement started due to vitamin D deficiency.   Review of Systems  Review of systems  Constitutional: + Minimally fluctuating body weight,  current  Body mass index is 26.76 kg/m. , no fatigue, no subjective hyperthermia, no subjective hypothermia   Objective:    BP 112/74   Pulse 84   Ht 5\' 5"  (1.651 m)   Wt 160 lb 12.8 oz (72.9 kg)   LMP 03/23/2020   BMI 26.76 kg/m   Wt Readings from Last 3 Encounters:  04/16/20 160 lb 12.8 oz (72.9 kg)  03/26/20 158 lb 6.4 oz (71.8 kg)  04/17/19 159 lb (72.1 kg)    Physical Exam  Constitutional: + Overweight for height, not in acute distress, normal state of mind.    Eyes: PERRLA, EOMI, no exophthalmos ENT:  + Repaired cleft lip,  moist mucous membranes, + no gross thyromegaly,  no cervical lymphadenopathy.     Recent Results (from the past 2160 hour(s))  VITAMIN D 25 Hydroxy (Vit-D Deficiency, Fractures)     Status: None   Collection Time: 03/26/20 10:23 AM  Result Value Ref Range   Vit D, 25-Hydroxy 89.0 30.0 - 100.0 ng/mL    Comment:  Vitamin D deficiency has been defined by the Institute of Medicine and an Endocrine Society practice guideline as a level of serum 25-OH vitamin D less than 20 ng/mL (1,2). The Endocrine Society went on to further define vitamin D insufficiency as a level between 21 and 29 ng/mL (2). 1. IOM (Institute of Medicine). 2010. Dietary reference    intakes for calcium and D. [place]: The    [employer]. 2. Holick MF, Binkley [place], Bischoff-Ferrari HA, et al.    Evaluation, treatment, and prevention of vitamin D    deficiency: an Endocrine Society clinical practice    guideline. JCEM. 2011 Jul; 96(7):1911-30.   T4, Free     Status: None   Collection Time: 03/26/20 10:23 AM  Result Value Ref Range   Free T4 1.33 0.82 - 1.77 ng/dL  TSH     Status: None   Collection Time: 03/26/20 10:23 AM  Result Value Ref Range   TSH 0.657 0.450 - 4.500 uIU/mL  T3, Free     Status: None   Collection Time: 03/26/20 10:23 AM  Result Value Ref Range   T3, Free 3.6 2.0 - 4.4 pg/mL     On 09/30/2016 she underwent thyroid uptake and scan which showed 26% (normal 10-30 percent) uniform uptake with no nodularity.  Assessment & Plan:    1. Elevated antithyroid autoantibodies -Her previsit thyroid function tests continue to be in the euthyroid range.  she has previously documented elevated antithyroid antibodies  putting her at risk for future thyroid dysfunction.  At this point, she will not need intervention with thyroid hormone or antithyroid medications.   She will continue to need yearly evaluation for thyroid function test.  2. Vitamin D deficiency She is now vitamin D replete at 89.  She is advised to finish her current supplies of vitamin D3 5000 units daily, and discontinue until next measurement.   -She will return in 1 year with repeat thyroid function test, as well as 25-hydroxy vitamin D.  - I advised patient to maintain close follow up with Elenore Paddy, NP for primary care  needs.     - Time spent on this patient care encounter:  30 minutes of which 50% was spent in  counseling and the rest reviewing  her current and  previous labs / studies and medications  doses and developing a plan for long term care, and documenting this care. Kimberly Ramsey  participated in the discussions, expressed understanding, and voiced agreement with the above plans.  All questions were answered to her satisfaction. she is encouraged to contact clinic should she have any questions or concerns prior to her return visit.   Follow up plan: Return in about 1 year (around 04/16/2021) for F/U with Pre-visit Labs.  Marquis Lunch, MD Phone: [phone]  Fax:  [phone]   04/16/2020, 12:21 PM

## 2020-05-06 ENCOUNTER — Other Ambulatory Visit: Payer: Self-pay | Admitting: Women's Health

## 2020-06-16 ENCOUNTER — Other Ambulatory Visit: Payer: Self-pay | Admitting: Women's Health

## 2020-06-26 ENCOUNTER — Encounter (INDEPENDENT_AMBULATORY_CARE_PROVIDER_SITE_OTHER): Payer: 59 | Admitting: Nurse Practitioner

## 2020-08-07 ENCOUNTER — Other Ambulatory Visit: Payer: Self-pay

## 2020-08-07 ENCOUNTER — Encounter (INDEPENDENT_AMBULATORY_CARE_PROVIDER_SITE_OTHER): Payer: Self-pay | Admitting: Nurse Practitioner

## 2020-08-07 ENCOUNTER — Ambulatory Visit (INDEPENDENT_AMBULATORY_CARE_PROVIDER_SITE_OTHER): Payer: 59 | Admitting: Nurse Practitioner

## 2020-08-07 VITALS — BP 112/78 | HR 80 | Temp 97.3°F | Ht 64.75 in | Wt 161.0 lb

## 2020-08-07 DIAGNOSIS — E559 Vitamin D deficiency, unspecified: Secondary | ICD-10-CM

## 2020-08-07 DIAGNOSIS — Z1159 Encounter for screening for other viral diseases: Secondary | ICD-10-CM

## 2020-08-07 DIAGNOSIS — Z13220 Encounter for screening for lipoid disorders: Secondary | ICD-10-CM

## 2020-08-07 DIAGNOSIS — Z0001 Encounter for general adult medical examination with abnormal findings: Secondary | ICD-10-CM

## 2020-08-07 DIAGNOSIS — Z131 Encounter for screening for diabetes mellitus: Secondary | ICD-10-CM

## 2020-08-07 DIAGNOSIS — Z1329 Encounter for screening for other suspected endocrine disorder: Secondary | ICD-10-CM

## 2020-08-07 NOTE — Progress Notes (Signed)
Subjective:  Patient ID: Kimberly Ramsey, female    DOB: 1985/03/18  Age: 35 y.o. MRN: [id number]  CC:  Chief Complaint  Patient presents with   Annual Exam    Doing well      HPI  This patient arrives today for the above.  She is up-to-date with Pap smear and tetanus vaccine.  She is due for hepatitis C screening and is willing to go through with this today.  She has no acute complaints today.  Past Medical History:  Diagnosis Date   Cleft lip and palate    Thrombocytopenia complicating pregnancy (HCC)    Thyroid disease    followed by Dr. 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   Social History   Tobacco Use   Smoking status: Never   Smokeless tobacco: Never  Substance Use Topics   Alcohol use: No     Current Meds  Medication Sig   BIOTIN PO Take 1 capsule by mouth daily.   levonorgestrel (LILETTA, 52 MG,) 19.5 MCG/DAY IUD IUD 1 each by Intrauterine route once.   valACYclovir (VALTREX) 1000 MG tablet TAKE 1 TABLET DAILY FOR 5 DAYS. (Patient taking differently: as needed.)   vitamin B-12 (CYANOCOBALAMIN) 100 MCG tablet Take 100 mcg by mouth daily.    ROS:  Review of Systems  Constitutional:  Negative for malaise/fatigue.  Eyes:  Negative for blurred vision.  Respiratory:  Negative for shortness of breath.   Cardiovascular:  Negative for chest pain.  Gastrointestinal:  Positive for blood in stool (only when hemorrhoids flare up). Negative for abdominal pain.  Neurological:  Negative for dizziness and headaches.  Psychiatric/Behavioral:  Negative for depression and suicidal ideas.     Objective:   Today's Vitals: BP 112/78   Pulse 80   Temp (!) 97.3 F  (36.3 C) (Temporal)   Ht 5' 4.75" (1.645 m)   Wt 161 lb (73 kg)   SpO2 98%   BMI 27.00 kg/m  Vitals with BMI 08/07/2020 04/16/2020 03/26/2020  Height 5' 4.75" 5\' 5"  5\' 5"   Weight 161 lbs 160 lbs 13 oz 158 lbs 6 oz  BMI 26.99 26.76 26.36  Systolic 112 112  Diastolic 78 74 70  Pulse 80 84 60     Physical Exam Vitals reviewed.  Constitutional:      Appearance: Normal appearance.  HENT:     Head: Normocephalic and atraumatic.     Right Ear: Tympanic membrane, ear canal and external ear normal.     Left Ear: Tympanic membrane, ear canal and external ear normal.  Eyes:     General:        Right eye: No discharge.        Left eye: No discharge.     Extraocular Movements: Extraocular movements intact.     Conjunctiva/sclera: Conjunctivae normal.     Pupils: Pupils are equal, round, and reactive to light.  Neck:     Vascular: No carotid bruit.  Cardiovascular:     Rate and Rhythm: Normal rate and regular rhythm.     Pulses: Normal pulses.  Heart sounds: Normal heart sounds. No murmur heard. Pulmonary:     Effort: Pulmonary effort is normal.     Breath sounds: Normal breath sounds.  Chest:  Breasts:    Breasts are symmetrical.     Right: Normal. No supraclavicular adenopathy.     Left: Normal. No supraclavicular adenopathy.     Comments: Faustino Congress as chaperone Abdominal:     General: Abdomen is flat. Bowel sounds are normal. There is no distension.     Palpations: Abdomen is soft. There is no mass.     Tenderness: There is no abdominal tenderness.  Musculoskeletal:        General: No tenderness.     Cervical back: Neck supple. No muscular tenderness.     Right lower leg: No edema.     Left lower leg: No edema.  Lymphadenopathy:     Cervical: No cervical adenopathy.     Upper Body:     Right upper body: No supraclavicular adenopathy.     Left upper body: No supraclavicular adenopathy.  Skin:    General: Skin is warm and dry.  Neurological:     General: No  focal deficit present.     Mental Status: She is alert and oriented to person, place, and time.     Motor: No weakness.     Gait: Gait normal.  Psychiatric:        Mood and Affect: Mood normal.        Behavior: Behavior normal.        Judgment: Judgment normal.         Assessment and Plan   1. Encounter for general adult medical examination with abnormal findings   2. Encounter for hepatitis C screening test for low risk patient   3. Vitamin D deficiency disease   4. Screening for diabetes mellitus   5. Screening, lipid   6. Screening for thyroid disorder      Plan: 1.-6.  Exam was normal.  We will check blood work for further evaluation of her other diagnoses today.  We will also screen for hepatitis C.   Tests ordered Orders Placed This Encounter  Procedures   CBC   COMPLETE METABOLIC PANEL WITH GFR   Lipid panel   Hemoglobin A1c   TSH   VITAMIN D 25 Hydroxy (Vit-D Deficiency, Fractures)   Hep C Antibody      No orders of the defined types were placed in this encounter.   Patient to follow-up in 6 months or sooner as needed.  Elenore Paddy, NP

## 2020-08-08 LAB — HEMOGLOBIN A1C
Hgb A1c MFr Bld: 4.9 % of total Hgb (ref <5.7)
Mean Plasma Glucose: 94 mg/dL
eAG (mmol/L): 5.2 mmol/L

## 2020-08-08 LAB — CBC
HCT: 47.8 % — ABNORMAL HIGH (ref 35.0–45.0)
Hemoglobin: 15.6 g/dL — ABNORMAL HIGH (ref 11.7–15.5)
MCH: 28.5 pg (ref 27.0–33.0)
MCHC: 32.6 g/dL (ref 32.0–36.0)
MCV: 87.2 fL (ref 80.0–100.0)
MPV: 12.9 fL — ABNORMAL HIGH (ref 7.5–12.5)
Platelets: 158 10*3/uL (ref 140–400)
RBC: 5.48 10*6/uL — ABNORMAL HIGH (ref 3.80–5.10)
RDW: 13.6 % (ref 11.0–15.0)
WBC: 4.1 10*3/uL (ref 3.8–10.8)

## 2020-08-08 LAB — COMPLETE METABOLIC PANEL WITH GFR
AG Ratio: 1.4 (calc) (ref 1.0–2.5)
ALT: 15 U/L (ref 6–29)
AST: 16 U/L (ref 10–30)
Albumin: 4.5 g/dL (ref 3.6–5.1)
Alkaline phosphatase (APISO): 106 U/L (ref 31–125)
BUN: 15 mg/dL (ref 7–25)
CO2: 30 mmol/L (ref 20–32)
Calcium: 9.7 mg/dL (ref 8.6–10.2)
Chloride: 102 mmol/L (ref 98–110)
Creat: 0.87 mg/dL (ref 0.50–1.10)
GFR, Est African American: 101 mL/min/{1.73_m2} (ref >=60)
GFR, Est Non African American: 88 mL/min/{1.73_m2} (ref >=60)
Globulin: 3.2 g/dL (calc) (ref 1.9–3.7)
Glucose, Bld: 73 mg/dL (ref 65–99)
Potassium: 4.2 mmol/L (ref 3.5–5.3)
Sodium: 139 mmol/L (ref 135–146)
Total Bilirubin: 0.6 mg/dL (ref 0.2–1.2)
Total Protein: 7.7 g/dL (ref 6.1–8.1)

## 2020-08-08 LAB — LIPID PANEL
Cholesterol: 160 mg/dL (ref <200)
HDL: 53 mg/dL (ref >=50)
LDL Cholesterol (Calc): 89 mg/dL (calc)
Non-HDL Cholesterol (Calc): 107 mg/dL (calc) (ref <130)
Total CHOL/HDL Ratio: 3 (calc) (ref <5.0)
Triglycerides: 88 mg/dL (ref <150)

## 2020-08-08 LAB — HEPATITIS C ANTIBODY
Hepatitis C Ab: NONREACTIVE
SIGNAL TO CUT-OFF: 0.02 (ref <1.00)

## 2020-08-08 LAB — TSH: TSH: 0.42 mIU/L

## 2020-08-08 LAB — VITAMIN D 25 HYDROXY (VIT D DEFICIENCY, FRACTURES): Vit D, 25-Hydroxy: 36 ng/mL (ref 30–100)

## 2021-02-05 ENCOUNTER — Ambulatory Visit (INDEPENDENT_AMBULATORY_CARE_PROVIDER_SITE_OTHER): Payer: 59 | Admitting: Internal Medicine

## 2021-03-28 LAB — T4, FREE: Free T4: 1.2 ng/dL (ref 0.82–1.77)

## 2021-03-28 LAB — TSH: TSH: 0.4 u[IU]/mL — ABNORMAL LOW (ref 0.450–4.500)

## 2021-03-28 LAB — VITAMIN D 25 HYDROXY (VIT D DEFICIENCY, FRACTURES): Vit D, 25-Hydroxy: 58.7 ng/mL (ref 30.0–100.0)

## 2021-04-11 ENCOUNTER — Other Ambulatory Visit: Payer: Self-pay | Admitting: Gastroenterology

## 2021-04-14 NOTE — Telephone Encounter (Signed)
Noted  

## 2021-04-16 ENCOUNTER — Ambulatory Visit: Payer: 59 | Admitting: "Endocrinology

## 2021-04-16 ENCOUNTER — Other Ambulatory Visit: Payer: Self-pay

## 2021-04-16 ENCOUNTER — Encounter: Payer: Self-pay | Admitting: "Endocrinology

## 2021-04-16 VITALS — BP 106/78 | HR 88 | Ht 64.75 in | Wt 169.2 lb

## 2021-04-16 DIAGNOSIS — E063 Autoimmune thyroiditis: Secondary | ICD-10-CM

## 2021-04-16 DIAGNOSIS — E559 Vitamin D deficiency, unspecified: Secondary | ICD-10-CM

## 2021-04-16 NOTE — Progress Notes (Signed)
?04/16/2021 ? ?Endocrinology follow-up note ? ? ?Subjective:  ? ? Patient ID: Kimberly Ramsey, female    DOB: 12-10-1985, PCP Ailene Ards, NP ? ? ?Past Medical History:  ?Diagnosis Date  ? Cleft lip and palate   ? Thrombocytopenia complicating pregnancy ([place])   ? Thyroid disease   ? followed by Dr. Dorris Fetch  ? ?Past Surgical History:  ?Procedure Laterality Date  ? CLEFT PALATE REPAIR    ? COLONOSCOPY N/A 07/21/2017  ? TI normal, torturous colon, internal hemorrhoids s/p banding. External hemorrhoids. Repeat colonoscopy in 5 years.   ? HEMORRHOID BANDING N/A 07/21/2017  ? Procedure: HEMORRHOID BANDING;  Surgeon: Danie Binder, MD;  Location: [facility];  Service: Endoscopy;  Laterality: N/A;  ? HIP SURGERY    ? took bone from hip to fix cleft palate.  ? ?Social History  ? ?Socioeconomic History  ? Marital status: Single  ?  Spouse name: Not on file  ? Number of children: Not on file  ? Years of education: Not on file  ? Highest education level: Not on file  ?Occupational History  ? Not on file  ?Tobacco Use  ? Smoking status: Never  ? Smokeless tobacco: Never  ?Vaping Use  ? Vaping Use: Never used  ?Substance and Sexual Activity  ? Alcohol use: No  ? Drug use: No  ? Sexual activity: Yes  ?  Birth control/protection: I.U.D.  ?Other Topics Concern  ? Not on file  ?Social History Narrative  ? 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  ? ?Social Determinants of Health  ? ?Financial Resource Strain: Not on file  ?Food Insecurity: Not on file  ?Transportation Needs: Not on file  ?Physical Activity: Not on file  ?Stress: Not on file  ?Social Connections: Not on file  ? ?Outpatient Encounter Medications as of 04/16/2021  ?Medication Sig  ? Cholecalciferol (VITAMIN D3 PO) Take 1 tablet by mouth daily in the afternoon.  ? Multiple Vitamins-Minerals (MULTIVITAMIN WOMEN PO) Take 1 tablet by mouth daily in the afternoon.  ? BIOTIN PO Take 1 capsule by mouth daily.  ? levonorgestrel  (LILETTA, 52 MG,) 19.5 MCG/DAY IUD IUD 1 each by Intrauterine route once.  ? valACYclovir (VALTREX) 1000 MG tablet TAKE 1 TABLET DAILY FOR 5 DAYS. (Patient taking differently: as needed.)  ? vitamin B-12 (CYANOCOBALAMIN) 100 MCG tablet Take 100 mcg by mouth daily.  ? ?No facility-administered encounter medications on file as of 04/16/2021.  ? ?ALLERGIES: ?Allergies  ?Allergen Reactions  ? Codeine Nausea And Vomiting  ? ? ?VACCINATION STATUS: ?Immunization History  ?Administered Date(s) Administered  ? Tdap 08/18/2013  ? ? ?HPI ?36 year old female patient with medical history as above. She is being seen in follow-up with repeat thyroid function tests after she was seen in consultation for Hashimoto's thyroiditis.  She is not currently on thyroid hormone supplement or antithyroid intervention.   ? ?-She did have unremarkable thyroid uptake and scan in 2018.   ? ?- She denies palpitations, heat intolerance, tremors.  She has a steady weight since last visit.    She has normal menstrual cycles, mother of 3 children. ?She denies any family history of thyroid dysfunction. ?- She denies dysphagia, shortness of breath, voice change. ?She is on vitamin D supplement started due to vitamin D deficiency. ? ? ?Review of Systems ? ?Review of systems ? ?Constitutional: + Minimally fluctuating body weight,  current  Body mass index is 28.37 kg/m?. , no fatigue, no  subjective hyperthermia, no subjective hypothermia ? ? ?Objective:  ?  ?BP 106/78   Pulse 88   Ht 5' 4.75" (1.645 m)   Wt 169 lb 3.2 oz (76.7 kg)   BMI 28.37 kg/m?   ?Wt Readings from Last 3 Encounters:  ?04/16/21 169 lb 3.2 oz (76.7 kg)  ?08/07/20 161 lb (73 kg)  ?04/16/20 160 lb 12.8 oz (72.9 kg)  ?  ?Physical Exam ? ?Constitutional: + Overweight for height, not in acute distress, normal state of mind.   ? ?Eyes: PERRLA, EOMI, no exophthalmos ?ENT:  + Repaired cleft lip,  moist mucous membranes, + no gross thyromegaly,  no cervical lymphadenopathy.   ? ? ?Recent  Results (from the past 2160 hour(s))  ?TSH     Status: Abnormal  ? Collection Time: 03/27/21 11:56 AM  ?Result Value Ref Range  ? TSH 0.400 (L) 0.450 - 4.500 uIU/mL  ?T4, free     Status: None  ? Collection Time: 03/27/21 11:56 AM  ?Result Value Ref Range  ? Free T4 1.20 0.82 - 1.77 ng/dL  ?VITAMIN D 25 Hydroxy (Vit-D Deficiency, Fractures)     Status: None  ? Collection Time: 03/27/21 11:56 AM  ?Result Value Ref Range  ? Vit D, 25-Hydroxy 58.7 30.0 - 100.0 ng/mL  ?  Comment: Vitamin D deficiency has been defined by the Institute of ?Medicine and an Endocrine Society practice guideline as a ?level of serum 25-OH vitamin D less than 20 ng/mL (1,2). ?The Endocrine Society went on to further define vitamin D ?insufficiency as a level between 21 and 29 ng/mL (2). ?1. IOM (Institute of Medicine). 2010. Dietary reference ?   intakes for calcium and D. [place]: The ?   [employer]. ?2. Holick MF, Binkley [place], Bischoff-Ferrari HA, et al. ?   Evaluation, treatment, and prevention of vitamin D ?   deficiency: an Endocrine Society clinical practice ?   guideline. JCEM. 2011 Jul; 96(7):1911-30. ?  ? ? ? ?On 09/30/2016 she underwent thyroid uptake and scan which showed 26% (normal 10-30 percent) uniform uptake with no nodularity. ? ?Assessment & Plan:  ? ? 1. Elevated antithyroid autoantibodies ?-Her previsit thyroid function tests before this visit showed slightly suppressed TSH consistent with mild subclinical hyperthyroidism.  She will not need antithyroid intervention at this time.   ?  she has previously documented elevated antithyroid antibodies  putting her at risk for future thyroid dysfunction.  She will have repeat thyroid function test and office visit in 3 months.   ? ?2. Vitamin D deficiency ?She is now vitamin D replete at 36.  She is advised to take her vitamin D3 5000 units every other day.   ? ? ?- I advised patient to maintain close follow up with Ailene Ards, NP for primary care  needs. ? ? ?I spent 21 minutes in the care of the patient today including review of labs from Thyroid Function, CMP, and other relevant labs ; imaging/biopsy records (current and previous including abstractions from other facilities); face-to-face time discussing  her lab results and symptoms, medications doses, her options of short and long term treatment based on the latest standards of care / guidelines;   and documenting the encounter. ? ?Kimberly Ramsey  participated in the discussions, expressed understanding, and voiced agreement with the above plans.  All questions were answered to her satisfaction. she is encouraged to contact clinic should she have any questions or concerns prior to her return visit. ? ? ?Follow up  plan: ?Return in about 3 months (around 07/17/2021) for F/U with Pre-visit Labs. ? ?Glade Lloyd, MD ?Phone: [phone]  Fax: [phone]  ? ?04/16/2021, 5:18 PM ?

## 2021-04-27 ENCOUNTER — Telehealth: Payer: Self-pay | Admitting: Gastroenterology

## 2021-04-27 NOTE — Telephone Encounter (Signed)
Patient needs hydrocortisone cream sent to [facility] for a refill  ?

## 2021-04-27 NOTE — Telephone Encounter (Signed)
Hi Kimberly Ramsey ?         Before I send this message along to Kimberly Ramsey you will need to call the office and schedule a appt with her . The last time you were seen was 09/25/2018. She will need to re-evaluate you. ?So please call the office @ [phone] before 4:45 pm today or tomorrow to be put on the schedule. ?  ?  ?Dena ? ? ? ?(This was sent to the pt in her MyChart) ?

## 2021-04-28 ENCOUNTER — Ambulatory Visit (INDEPENDENT_AMBULATORY_CARE_PROVIDER_SITE_OTHER): Payer: 59 | Admitting: Nurse Practitioner

## 2021-04-28 ENCOUNTER — Encounter (HOSPITAL_BASED_OUTPATIENT_CLINIC_OR_DEPARTMENT_OTHER): Payer: Self-pay | Admitting: Nurse Practitioner

## 2021-04-28 ENCOUNTER — Other Ambulatory Visit: Payer: Self-pay

## 2021-04-28 VITALS — BP 124/68 | HR 77 | Resp 99 | Ht 65.0 in | Wt 171.2 lb

## 2021-04-28 DIAGNOSIS — Z0000 Encounter for general adult medical examination without abnormal findings: Secondary | ICD-10-CM | POA: Diagnosis not present

## 2021-04-28 DIAGNOSIS — E559 Vitamin D deficiency, unspecified: Secondary | ICD-10-CM | POA: Diagnosis not present

## 2021-04-28 DIAGNOSIS — R768 Other specified abnormal immunological findings in serum: Secondary | ICD-10-CM

## 2021-04-28 DIAGNOSIS — E538 Deficiency of other specified B group vitamins: Secondary | ICD-10-CM | POA: Diagnosis not present

## 2021-04-28 DIAGNOSIS — K649 Unspecified hemorrhoids: Secondary | ICD-10-CM | POA: Diagnosis not present

## 2021-04-28 DIAGNOSIS — E063 Autoimmune thyroiditis: Secondary | ICD-10-CM

## 2021-04-28 MED ORDER — HYDROCORTISONE 2.5 % EX KIT: PACK | CUTANEOUS | 2 refills | Status: DC

## 2021-04-28 MED ORDER — VALACYCLOVIR HCL 1 G PO TABS: 1000.0000 mg | ORAL_TABLET | Freq: Two times a day (BID) | ORAL | 6 refills | Status: AC

## 2021-04-28 NOTE — Assessment & Plan Note (Signed)
CPE today with no concerning findings present.  ?Labs today ?Updated HM and recommendations discussed.  ?Will make changes to plan of care based on labs.  ?

## 2021-04-28 NOTE — Assessment & Plan Note (Signed)
Chronic. Daily replacement. Will obtain labs today.  ?

## 2021-04-28 NOTE — Patient Instructions (Signed)
Thank you for choosing [place] at [facility] for your Primary Care needs. I am excited for the opportunity to partner with you to meet your health care goals. It was a pleasure meeting you today! ? ?Recommendations from today's visit: ?We will call you with lab results  ? ?Information on diet, exercise, and health maintenance recommendations are listed below. This is information to help you be sure you are on track for optimal health and monitoring.  ? ?Please look over this and let us know if you have any questions or if you have completed any of the health maintenance outside of [place] so that we can be sure your records are up to date.  ?___________________________________________________________ ?About Me: ?I am an Adult-Geriatric Nurse Practitioner with a background in caring for patients for more than 20 years with a strong intensive care background. I provide primary care and sports medicine services to patients age 18 and older within this office. My education had a strong focus on caring for the older adult population, which I am passionate about. I am also the director of the APP Fellowship with [facility].  ? ?My desire is to provide you with the best service through preventive medicine and supportive care. I consider you a part of the medical team and value your input. I work diligently to ensure that you are heard and your needs are met in a safe and effective manner. I want you to feel comfortable with me as your provider and want you to know that your health concerns are important to me. ? ?For your information, our office hours are: ?Monday, Tuesday, and Thursday 8:00 AM - 5:00 PM ?Wednesday and Friday 8:00 AM - 12:00 PM.  ? ?In my time away from the office I am teaching new APP's within the system and am unavailable, but my partner, Dr. Burnard Bunting is in the office for emergent needs.  ? ?If you have questions or concerns, please call our office at [phone] or send [place] a  MyChart message and we will respond as quickly as possible.  ?____________________________________________________________ ?MyChart:  ?For all urgent or time sensitive needs we ask that you please call the office to avoid delays. Our number is [phone]. ?MyChart is not constantly monitored and due to the large volume of messages a day, replies may take up to 72 business hours. ? ?MyChart Policy: ?MyChart allows for you to see your visit notes, after visit summary, provider recommendations, lab and tests results, make an appointment, request refills, and contact your provider or the office for non-urgent questions or concerns. Providers are seeing patients during normal business hours and do not have built in time to review MyChart messages.  ?We ask that you allow a minimum of 3 business days for responses to [employer]. For this reason, please do not send urgent requests through [place]. Please call the office at [phone]. ?New and ongoing conditions may require a visit. We have virtual and in person visit available for your convenience.  ?Complex MyChart concerns may require a visit. Your provider may request you schedule a virtual or in person visit to ensure we are providing the best care possible. ?MyChart messages sent after 11:00 AM on Friday will not be received by the provider until Monday morning.  ?  ?Lab and Test Results: ?You will receive your lab and test results on MyChart as soon as they are completed and results have been sent by the lab or testing facility. Due to  this service, you will receive your results BEFORE your provider.  ?I review lab and tests results each morning prior to seeing patients. Some results require collaboration with other providers to ensure you are receiving the most appropriate care. For this reason, we ask that you please allow a minimum of 3-5 business days from the time the ALL results have been received for your provider to receive and review lab and  test results and contact you about these.  ?Most lab and test result comments from the provider will be sent through [place]. Your provider may recommend changes to the plan of care, follow-up visits, repeat testing, ask questions, or request an office visit to discuss these results. You may reply directly to this message or call the office at [phone] to provide information for the provider or set up an appointment. ?In some instances, you will be called with test results and recommendations. Please let us know if this is preferred and we will make note of this in your chart to provide this for you.    ?If you have not heard a response to your lab or test results in 5 business days from all results returning to [place], please call the office to let us know. We ask that you please avoid calling prior to this time unless there is an emergent concern. Due to high call volumes, this can delay the resulting process. ? ?After Hours: ?For all non-emergency after hours needs, please call the office at [phone] and select the option to reach the on-call provider service. On-call services are shared between multiple [place] offices and therefore it will not be possible to speak directly with your provider. On-call providers may provide medical advice and recommendations, but are unable to provide refills for maintenance medications.  ?For all emergency or urgent medical needs after normal business hours, we recommend that you seek care at the closest Urgent Care or Emergency Department to ensure appropriate treatment in a timely manner.  ?[facility] [place] [place] has a 24 hour emergency room located on the ground floor for your convenience.  ? ?Urgent Concerns During the Business Day ?Providers are seeing patients from 8AM to [place] with a busy schedule and are most often not able to respond to non-urgent calls until the end of the day or the next business day. ?If you should have URGENT concerns during  the day, please call and speak to the nurse or schedule a same day appointment so that we can address your concern without delay.  ? ?Thank you, again, for choosing me as your health care partner. I appreciate your trust and look forward to learning more about you.  ? ?Worthy Keeler, DNP, AGNP-c ?___________________________________________________________ ? ?Health Maintenance Recommendations ?Screening Testing ?Mammogram ?Every 1 -2 years based on history and risk factors ?Starting at age 76 ?Pap Smear ?Ages 21-39 every 3 years ?Ages 36-65 every 5 years with HPV testing ?More frequent testing may be required based on results and history ?Colon Cancer Screening ?Every 1-10 years based on test performed, risk factors, and history ?Starting at age 8 ?Bone Density Screening ?Every 2-10 years based on history ?Starting at age 70 for women ?Recommendations for men differ based on medication usage, history, and risk factors ?AAA Screening ?One time ultrasound ?Men 62-69 years old who have every smoked ?Lung Cancer Screening ?Low Dose Lung CT every 12 months ?Age 7-80 years with a 30 pack-year smoking history who still smoke or who have quit within the last 15 years ? ?  Screening Labs ?Routine  Labs: Complete Blood Count (CBC), Complete Metabolic Panel (CMP), Cholesterol (Lipid Panel) ?Every 6-12 months based on history and medications ?May be recommended more frequently based on current conditions or previous results ?Hemoglobin A1c Lab ?Every 3-12 months based on history and previous results ?Starting at age 63 or earlier with diagnosis of diabetes, high cholesterol, BMI >26, and/or risk factors ?Frequent monitoring for patients with diabetes to ensure blood sugar control ?Thyroid Panel (TSH w/ T3 & T4) ?Every 6 months based on history, symptoms, and risk factors ?May be repeated more often if on medication ?HIV ?One time testing for all patients 37 and older ?May be repeated more frequently for patients with increased  risk factors or exposure ?Hepatitis C ?One time testing for all patients 20 and older ?May be repeated more frequently for patients with increased risk factors or exposure ?Gonorrhea, Chlamydia ?Every 1

## 2021-04-28 NOTE — Assessment & Plan Note (Signed)
Historical. Will obtain labs today for evaluation and make recommendations based on findings. No concerning symptoms present today.  ?

## 2021-04-28 NOTE — Assessment & Plan Note (Signed)
Chronic. No alarm sx present today. Generally well controlled with intermittent exacerbations. Hydrocortisone cream effective. Will send refill today for medication. F/U if sx worsen or fail to improve.  ?

## 2021-04-28 NOTE — Progress Notes (Signed)
?Orma Render, DNP, AGNP-c ?Primary Care & Sports Medicine ?[place], [place] [address] ?[phone] ? ?New patient visit ? ? ?Patient: Kimberly Ramsey   DOB: 12-13-1985   36 y.o. Female  MRN: [id number] ?Visit Date: 04/28/2021 ? ?Patient Care Team: ?Kaimana Lurz, Coralee Pesa, NP as PCP - General (Nurse Practitioner) ?Fields, Marga Melnick, MD (Inactive) as Consulting Physician (Gastroenterology) ?Michael Boston, MD as Consulting Physician (General Surgery) ?Annitta Needs, NP as Nurse Practitioner (Gastroenterology) ? ?Today's healthcare provider: Orma Render, NP  ? ?Chief Complaint  ?Patient presents with  ? New Patient (Initial Visit)  ? ?Subjective  ?  ?Kimberly Ramsey is a 36 y.o. female who presents today as a new patient to establish care.  ? Her previous PCP passed away unexpectedly and she is in need of primary care services and wellness exam today with labs.  ? ?Patient endorses the following concerns presently: ?Hemorrhoids ?- chronic condition ?- previous banding a few years ago with return of symptoms ?- is followed by GI (Dr. Cyndi Bender), but has not been able to get an appointment until the summer ?- no bleeding present ?- uses prescription hydrocortisone 2.5 and needs a refill on this today ? ?Menstrual periods are regular, but alternate between normal flow and light spotting. IUD in place ? ?Has hx of hyperthyroid levels but taking B12 1037mcg and Vit D3 5000u with Biotin 10,037mcg daily for management.  ?Followed by Dr. Dorris Fetch.  ? ?Three children, 14, 9 and 7 years. Oldest daughter has Down Syndrome- doing well. No chronic concerns in other children. [place] [employer]  ? ?History reviewed and reveals the following: ?Past Medical History:  ?Diagnosis Date  ? Cleft lip and palate   ? Down syndrome in child of prior pregnancy, currently pregnant 01/16/2013  ? 6yo daughter, echo normal, no complications  ? Encounter for IUD insertion 10/03/2013  ? Mirena 10/03/13            08/01/18 Removal, insertion Liletta  ? Rectal bleeding 06/29/2017  ? Thrombocytopenia complicating pregnancy ([place])   ? Thyroid disease   ? followed by Dr. Dorris Fetch  ? ?Past Surgical History:  ?Procedure Laterality Date  ? CLEFT PALATE REPAIR    ? COLONOSCOPY N/A 07/21/2017  ? TI normal, torturous colon, internal hemorrhoids s/p banding. External hemorrhoids. Repeat colonoscopy in 5 years.   ? HEMORRHOID BANDING N/A 07/21/2017  ? Procedure: HEMORRHOID BANDING;  Surgeon: Danie Binder, MD;  Location: [facility];  Service: Endoscopy;  Laterality: N/A;  ? HIP SURGERY    ? took bone from hip to fix cleft palate.  ? ?Family Status  ?Relation Name Status  ? Mother  Alive  ? Father  Alive  ? Sister  Alive  ? Brother  Alive  ? Daughter  Alive  ? Sister  Alive  ? Sister  Alive  ? Sister  Alive  ? Brother  Deceased  ? Daughter  Alive  ? Daughter  Alive  ? Neg Hx  (Not Specified)  ? ?Family History  ?Problem Relation Age of Onset  ? Mental illness Mother   ? Schizophrenia Mother   ? Lupus Father   ? Colon cancer Father 49  ?     surgery/chemo  ? Colon polyps Neg Hx   ? ?Social History  ? ?Socioeconomic History  ? Marital status: Single  ?  Spouse name: Not on file  ? Number of children: Not on file  ? Years  of education: Not on file  ? Highest education level: Not on file  ?Occupational History  ? Not on file  ?Tobacco Use  ? Smoking status: Never  ? Smokeless tobacco: Never  ?Vaping Use  ? Vaping Use: Never used  ?Substance and Sexual Activity  ? Alcohol use: No  ? Drug use: No  ? Sexual activity: Yes  ?  Birth control/protection: I.U.D.  ?Other Topics Concern  ? Not on file  ?Social History Narrative  ? 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  ? ?Social Determinants of Health  ? ?Financial Resource Strain: Not on file  ?Food Insecurity: Not on file  ?Transportation Needs: Not on file  ?Physical Activity: Not on file  ?Stress: Not on file  ?Social Connections: Not on file   ? ?Outpatient Medications Prior to Visit  ?Medication Sig  ? BIOTIN PO Take 1 capsule by mouth daily.  ? Cholecalciferol (VITAMIN D3 PO) Take 1 tablet by mouth daily in the afternoon.  ? levonorgestrel (LILETTA, 52 MG,) 19.5 MCG/DAY IUD IUD 1 each by Intrauterine route once.  ? Multiple Vitamins-Minerals (MULTIVITAMIN WOMEN PO) Take 1 tablet by mouth daily in the afternoon.  ? vitamin B-12 (CYANOCOBALAMIN) 100 MCG tablet Take 100 mcg by mouth daily.  ? [DISCONTINUED] valACYclovir (VALTREX) 1000 MG tablet TAKE 1 TABLET DAILY FOR 5 DAYS. (Patient taking differently: as needed.)  ? ?No facility-administered medications prior to visit.  ? ?Allergies  ?Allergen Reactions  ? Codeine Nausea And Vomiting  ? ?Immunization History  ?Administered Date(s) Administered  ? PPD Test 01/05/2017  ? Tdap 08/18/2013  ? ? ?Health Maintenance Due: ?Health Maintenance  ?Topic Date Due  ? COVID-19 Vaccine (1) Never done  ? INFLUENZA VACCINE  05/08/2021 (Originally 09/08/2020)  ? PAP SMEAR-Modifier  05/01/2023  ? TETANUS/TDAP  08/19/2023  ? Hepatitis C Screening  Completed  ? HIV Screening  Completed  ? HPV VACCINES  Aged Out  ? ? ?Review of Systems ?All review of systems negative except what is listed in the HPI ? ? Objective  ?  ?BP 124/68   Pulse 77   Resp (!) 99   Ht 5\' 5"  (1.651 m)   Wt 171 lb 3.2 oz (77.7 kg)   LMP 04/28/2021   BMI 28.49 kg/m?  ?Physical Exam ?Vitals and nursing note reviewed.  ?Constitutional:   ?   General: She is not in acute distress. ?   Appearance: Normal appearance.  ?HENT:  ?   Head: Normocephalic and atraumatic.  ?   Right Ear: Hearing, tympanic membrane, ear canal and external ear normal.  ?   Left Ear: Hearing, tympanic membrane, ear canal and external ear normal.  ?   Nose: Nose normal.  ?   Right Sinus: No maxillary sinus tenderness or frontal sinus tenderness.  ?   Left Sinus: No maxillary sinus tenderness or frontal sinus tenderness.  ?   Mouth/Throat:  ?   Lips: Pink.  ?   Mouth: Mucous membranes  are moist.  ?   Pharynx: Oropharynx is clear.  ?Eyes:  ?   General: Lids are normal. Vision grossly intact.  ?   Extraocular Movements: Extraocular movements intact.  ?   Conjunctiva/sclera: Conjunctivae normal.  ?   Pupils: Pupils are equal, round, and reactive to light.  ?   Funduscopic exam: ?   Right eye: Red reflex present.     ?   Left eye: Red reflex present. ?   Visual  Fields: Right eye visual fields normal and left eye visual fields normal.  ?Neck:  ?   Thyroid: No thyromegaly.  ?   Vascular: No carotid bruit.  ?Cardiovascular:  ?   Rate and Rhythm: Normal rate and regular rhythm.  ?   Chest Wall: PMI is not displaced.  ?   Pulses: Normal pulses.     ?     Dorsalis pedis pulses are 2+ on the right side and 2+ on the left side.  ?     Posterior tibial pulses are 2+ on the right side and 2+ on the left side.  ?   Heart sounds: Normal heart sounds. No murmur heard. ?Pulmonary:  ?   Effort: Pulmonary effort is normal. No respiratory distress.  ?   Breath sounds: Normal breath sounds.  ?Abdominal:  ?   General: Abdomen is flat. Bowel sounds are normal. There is no distension.  ?   Palpations: Abdomen is soft. There is no hepatomegaly, splenomegaly or mass.  ?   Tenderness: There is no abdominal tenderness. There is no right CVA tenderness, left CVA tenderness, guarding or rebound.  ?Musculoskeletal:     ?   General: Normal range of motion.  ?   Cervical back: Full passive range of motion without pain, normal range of motion and neck supple. No tenderness.  ?   Right lower leg: No edema.  ?   Left lower leg: No edema.  ?Feet:  ?   Left foot:  ?   Toenail Condition: Left toenails are normal.  ?Lymphadenopathy:  ?   Cervical: No cervical adenopathy.  ?   Upper Body:  ?   Right upper body: No supraclavicular adenopathy.  ?   Left upper body: No supraclavicular adenopathy.  ?Skin: ?   General: Skin is warm and dry.  ?   Capillary Refill: Capillary refill takes less than 2 seconds.  ?   Nails: There is no clubbing.   ?Neurological:  ?   General: No focal deficit present.  ?   Mental Status: She is alert and oriented to person, place, and time.  ?   GCS: GCS eye subscore is 4. GCS verbal subscore is 5. GCS motor subscore is 6

## 2021-04-28 NOTE — Assessment & Plan Note (Signed)
Chronic. PRN valacyclovir refilled for patient today. No symptoms present. OK to use medication for intermittent outbreaks. If outbreaks occur more than 6 times a year, consider daily treatment for suppression.  ?

## 2021-04-29 LAB — COMPREHENSIVE METABOLIC PANEL
ALT: 16 IU/L (ref 0–32)
AST: 19 IU/L (ref 0–40)
Albumin/Globulin Ratio: 1.5 (ref 1.2–2.2)
Albumin: 4.9 g/dL — ABNORMAL HIGH (ref 3.8–4.8)
Alkaline Phosphatase: 125 IU/L — ABNORMAL HIGH (ref 44–121)
BUN/Creatinine Ratio: 13 (ref 9–23)
BUN: 12 mg/dL (ref 6–20)
Bilirubin Total: 0.4 mg/dL (ref 0.0–1.2)
CO2: 27 mmol/L (ref 20–29)
Calcium: 10 mg/dL (ref 8.7–10.2)
Chloride: 101 mmol/L (ref 96–106)
Creatinine, Ser: 0.94 mg/dL (ref 0.57–1.00)
Globulin, Total: 3.2 g/dL (ref 1.5–4.5)
Glucose: 84 mg/dL (ref 70–99)
Potassium: 4.1 mmol/L (ref 3.5–5.2)
Sodium: 141 mmol/L (ref 134–144)
Total Protein: 8.1 g/dL (ref 6.0–8.5)
eGFR: 82 mL/min/{1.73_m2} (ref >59)

## 2021-04-29 LAB — CBC WITH DIFFERENTIAL/PLATELET
Basophils Absolute: 0 10*3/uL (ref 0.0–0.2)
Basos: 1 %
EOS (ABSOLUTE): 0.1 10*3/uL (ref 0.0–0.4)
Eos: 2 %
Hematocrit: 46.9 % — ABNORMAL HIGH (ref 34.0–46.6)
Hemoglobin: 15.7 g/dL (ref 11.1–15.9)
Immature Grans (Abs): 0 10*3/uL (ref 0.0–0.1)
Immature Granulocytes: 0 %
Lymphocytes Absolute: 1.5 10*3/uL (ref 0.7–3.1)
Lymphs: 29 %
MCH: 27.9 pg (ref 26.6–33.0)
MCHC: 33.5 g/dL (ref 31.5–35.7)
MCV: 84 fL (ref 79–97)
Monocytes Absolute: 0.6 10*3/uL (ref 0.1–0.9)
Monocytes: 11 %
Neutrophils Absolute: 3 10*3/uL (ref 1.4–7.0)
Neutrophils: 57 %
Platelets: 163 10*3/uL (ref 150–450)
RBC: 5.62 x10E6/uL — ABNORMAL HIGH (ref 3.77–5.28)
RDW: 13.8 % (ref 11.7–15.4)
WBC: 5.2 10*3/uL (ref 3.4–10.8)

## 2021-04-29 LAB — VITAMIN D 25 HYDROXY (VIT D DEFICIENCY, FRACTURES): Vit D, 25-Hydroxy: 65.2 ng/mL (ref 30.0–100.0)

## 2021-04-29 LAB — B12 AND FOLATE PANEL
Folate: >20 ng/mL (ref >3.0)
Vitamin B-12: 1553 pg/mL — ABNORMAL HIGH (ref 232–1245)

## 2021-04-29 LAB — HEMOGLOBIN A1C
Est. average glucose Bld gHb Est-mCnc: 94 mg/dL
Hgb A1c MFr Bld: 4.9 % (ref 4.8–5.6)

## 2021-04-29 LAB — LDL CHOLESTEROL, DIRECT: LDL Direct: 90 mg/dL (ref 0–99)

## 2021-04-29 NOTE — Telephone Encounter (Signed)
Pt has an appt scheduled with you on 6/22.  ?

## 2021-05-04 ENCOUNTER — Encounter (HOSPITAL_BASED_OUTPATIENT_CLINIC_OR_DEPARTMENT_OTHER): Payer: Self-pay | Admitting: Nurse Practitioner

## 2021-05-08 ENCOUNTER — Other Ambulatory Visit (HOSPITAL_COMMUNITY)
Admission: RE | Admit: 2021-05-08 | Discharge: 2021-05-08 | Disposition: A | Discharge status: Home / Self Care | Payer: 59 | Source: Ambulatory Visit | Attending: Women's Health | Admitting: Women's Health

## 2021-05-08 ENCOUNTER — Encounter: Payer: Self-pay | Admitting: Women's Health

## 2021-05-08 ENCOUNTER — Ambulatory Visit (INDEPENDENT_AMBULATORY_CARE_PROVIDER_SITE_OTHER): Payer: 59 | Admitting: Women's Health

## 2021-05-08 VITALS — BP 130/86 | HR 97 | Ht 65.0 in | Wt 173.0 lb

## 2021-05-08 DIAGNOSIS — Z01419 Encounter for gynecological examination (general) (routine) without abnormal findings: Secondary | ICD-10-CM

## 2021-05-08 DIAGNOSIS — Z113 Encounter for screening for infections with a predominantly sexual mode of transmission: Secondary | ICD-10-CM

## 2021-05-08 NOTE — Progress Notes (Signed)
? ?GYN VISIT- Breast exam & pap ?Patient name: Kimberly Ramsey MRN [id number]  Date of birth: Apr 07, 1985 ?Chief Complaint:   ?Gynecologic Exam ? ?History of Present Illness:   ?Kimberly Ramsey is a 36 y.o. [id number] African-American female being seen today for breast exam and pap only. Had physical w/ PCP 04/28/21.  With same partner x 53yrs, may want to try for another baby sometime in future.  ?Patient's last menstrual period was 04/26/2021. ?The current method of family planning is IUD. Liletta placed 08/01/18 ?Last pap 05/01/18. Results were: NILM w/ HRHPV negative ?PCP: Jacolyn Reedy, NP- just had complete physical 04/28/21 ?No family h/o breast cancer ?Dad dx w/ colon cancer in his 57s ? ? ?  05/08/2021  ? 10:52 AM 04/28/2021  ?  2:15 PM 08/07/2020  ? 10:46 AM 03/26/2020  ?  9:32 AM 05/01/2018  ? 11:24 AM  ?Depression screen PHQ 2/9  ?Decreased Interest 0 0 0 0 0  ?Down, Depressed, Hopeless 0 0 0 0 0  ?PHQ - 2 Score 0 0 0 0 0  ?Altered sleeping 0  0    ?Tired, decreased energy 0  0    ?Change in appetite 0  0    ?Feeling bad or failure about yourself  0  0    ?Trouble concentrating 0  0    ?Moving slowly or fidgety/restless 0  0    ?Suicidal thoughts 0  0    ?PHQ-9 Score 0  0    ?Difficult doing work/chores   Not difficult at all    ? ?  ? ?  05/08/2021  ? 10:52 AM 03/26/2020  ?  9:33 AM  ?GAD 7 : Generalized Anxiety Score  ?Nervous, Anxious, on Edge 0 0  ?Control/stop worrying 0 0  ?Worry too much - different things 0 0  ?Trouble relaxing 0 0  ?Restless 0 0  ?Easily annoyed or irritable 0 0  ?Afraid - awful might happen 0 0  ?Total GAD 7 Score 0 0  ?Anxiety Difficulty  Not difficult at all  ? ? ? ?Review of Systems:   ?Pertinent items are noted in HPI ?Denies fever/chills, dizziness, headaches, visual disturbances, fatigue, shortness of breath, chest pain, abdominal pain, vomiting, abnormal vaginal discharge/itching/odor/irritation, problems with periods, bowel movements, urination, or intercourse unless otherwise stated  above.  ?Pertinent History Reviewed:  ?Reviewed past medical,surgical, social, obstetrical and family history.  ?Reviewed problem list, medications and allergies. ?Physical Assessment:  ? ?Vitals:  ? 05/08/21 1050  ?BP: 130/86  ?Pulse: 97  ?Weight: 173 lb (78.5 kg)  ?Height: 5\' 5"  (1.651 m)  ?Body mass index is 28.79 kg/m?. ? ?     Physical Examination:  ? General appearance: alert, well appearing, and in no distress ? Mental status: alert, oriented to person, place, and time ? Skin: warm & dry  ? Cardiovascular: normal heart rate noted ? Respiratory: normal respiratory effort, no distress ? Abdomen: soft, non-tender  ? Pelvic: VULVA: normal appearing vulva with no masses, tenderness or lesions, VAGINA: normal appearing vagina with normal color and discharge, no lesions, CERVIX: normal appearing cervix without discharge or lesions, IUD strings visible and appropriate length UTERUS: uterus is normal size, shape, consistency and nontender, ADNEXA: normal adnexa in size, nontender and no masses ? Extremities: no edema  ? ?Chaperone: Celene Squibb   ? ?No results found for this or any previous visit (from the past 24 hour(s)).  ?Assessment & Plan:  ?1) Breast exam and screening for cervical cancer ? ?  2) STD screen> gc/ct on pap ? ?Meds: No orders of the defined types were placed in this encounter. ? ? ?No orders of the defined types were placed in this encounter. ? ? ?Return in about 1 year (around 05/09/2022) for Physical. ? ?Roma Schanz CNM, WHNP-BC ?05/08/2021 ?11:15 AM  ?

## 2021-05-11 LAB — CYTOLOGY - PAP
Chlamydia: NEGATIVE
Comment: NEGATIVE
Comment: NEGATIVE
Comment: NORMAL
Diagnosis: NEGATIVE
High risk HPV: NEGATIVE
Neisseria Gonorrhea: NEGATIVE

## 2021-07-22 LAB — VITAMIN D 25 HYDROXY (VIT D DEFICIENCY, FRACTURES): Vit D, 25-Hydroxy: 55.5 ng/mL (ref 30.0–100.0)

## 2021-07-22 LAB — T4, FREE: Free T4: 2.33 ng/dL — ABNORMAL HIGH (ref 0.82–1.77)

## 2021-07-22 LAB — T3, FREE: T3, Free: 7 pg/mL — ABNORMAL HIGH (ref 2.0–4.4)

## 2021-07-22 LAB — TSH: TSH: 0.01 u[IU]/mL — ABNORMAL LOW (ref 0.450–4.500)

## 2021-07-23 ENCOUNTER — Ambulatory Visit: Payer: 59 | Admitting: "Endocrinology

## 2021-07-23 ENCOUNTER — Encounter: Payer: Self-pay | Admitting: "Endocrinology

## 2021-07-23 VITALS — BP 118/76 | HR 84 | Ht 65.0 in | Wt 170.6 lb

## 2021-07-23 DIAGNOSIS — E0590 Thyrotoxicosis, unspecified without thyrotoxic crisis or storm: Secondary | ICD-10-CM | POA: Diagnosis not present

## 2021-07-23 DIAGNOSIS — E559 Vitamin D deficiency, unspecified: Secondary | ICD-10-CM | POA: Diagnosis not present

## 2021-07-23 NOTE — Progress Notes (Signed)
07/23/2021  Endocrinology follow-up note   Subjective:    Patient ID: Kimberly Ramsey, female    DOB: 08/05/1985, PCP Early, Sara E, NP   Past Medical History:  Diagnosis Date   Cleft lip and palate    Down syndrome in child of prior pregnancy, currently pregnant 01/16/2013   36yo daughter, echo normal, no complications   Encounter for IUD insertion 10/03/2013   Mirena 10/03/13           08/01/18 Removal, insertion Liletta   Rectal bleeding 06/29/2017   Thrombocytopenia complicating pregnancy ([facility])    Thyroid disease    followed by Dr. Nida   Past Surgical History:  Procedure Laterality Date   CLEFT PALATE REPAIR     COLONOSCOPY N/A 07/21/2017   TI normal, torturous colon, internal hemorrhoids s/p banding. External hemorrhoids. Repeat colonoscopy in 5 years.    HEMORRHOID BANDING N/A 07/21/2017   Procedure: HEMORRHOID BANDING;  Surgeon: Fields, Sandi L, MD;  [facility];  Service: Endoscopy;  Laterality: N/A;   HIP SURGERY     took bone from hip to fix cleft palate.   Social History   Socioeconomic History   Marital status: Single    Spouse name: Not on file   Number of children: Not on file   Years of education: Not on file   Highest education level: Not on file  Occupational History   Not on file  Tobacco Use   Smoking status: Never   Smokeless tobacco: Never  Vaping Use   Vaping Use: Never used  Substance and Sexual Activity   Alcohol use: No   Drug use: No   Sexual activity: Yes    Birth control/protection: I.U.D.  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   Social Determinants of Health   Financial Resource Strain: Low Risk  (05/08/2021)   Overall Financial Resource Strain (CARDIA)    Difficulty of Paying Living Expenses: Not very hard  Food Insecurity: No Food Insecurity (05/08/2021)   Hunger Vital Sign    Worried About Running Out  of Food in the Last Year: Never true    Ran Out of Food in the Last Year: Never true  Transportation Needs: No Transportation Needs (05/08/2021)   PRAPARE - Transportation    Lack of Transportation (Medical): No    Lack of Transportation (Non-Medical): No  Physical Activity: Sufficiently Active (05/08/2021)   Exercise Vital Sign    Days of Exercise per Week: 5 days    Minutes of Exercise per Session: 60 min  Stress: No Stress Concern Present (05/08/2021)   [employer] - Occupational Stress Questionnaire    Feeling of Stress : Not at all  Social Connections: Moderately Isolated (05/08/2021)   Social Connection and Isolation Panel [NHANES]    Frequency of Communication with Friends and Family: Three times a week    Frequency of Social Gatherings with Friends and Family: Three times a week    Attends Religious Services: 1 to 4 times per year    Active Member of Clubs or Organizations: No    Attends Club or Organization Meetings: Never    Marital Status: Never married   Outpatient Encounter Medications as of 07/23/2021  Medication Sig   BIOTIN PO Take 1 capsule by mouth daily.   Cholecalciferol (VITAMIN D3 PO) Take 1 tablet by mouth daily in the afternoon.     Hydrocortisone 2.5 % KIT Apply rectally BID PRN (Patient not taking: Reported on 05/08/2021)   levonorgestrel (LILETTA, 52 MG,) 19.5 MCG/DAY IUD IUD 1 each by Intrauterine route once.   Multiple Vitamins-Minerals (MULTIVITAMIN WOMEN PO) Take 1 tablet by mouth daily in the afternoon.   vitamin B-12 (CYANOCOBALAMIN) 100 MCG tablet Take 100 mcg by mouth daily.   No facility-administered encounter medications on file as of 07/23/2021.   ALLERGIES: Allergies  Allergen Reactions   Codeine Nausea And Vomiting    VACCINATION STATUS: Immunization History  Administered Date(s) Administered   PPD Test 01/05/2017   Tdap 08/18/2013    HPI 36 year old female patient with medical history as above. She is being  seen in follow-up with repeat thyroid function tests after she was seen in consultation for Hashimoto's thyroiditis.  She is not currently on thyroid hormone supplement or antithyroid intervention.    -She did have unremarkable thyroid uptake and scan in 2018.  However, her pre-visit labs are changing and showing higher hormone levels.   - She denies palpitations, heat intolerance, tremors.  She has a steady weight since last visit.    She has normal menstrual cycles, mother of 3 children. She denies any family history of thyroid dysfunction. - She denies dysphagia, shortness of breath, voice change. She is on vitamin D supplement started due to vitamin D deficiency.   Review of Systems  Review of systems  Constitutional: + Minimally fluctuating body weight,  current  Body mass index is 28.39 kg/m. , no fatigue, no subjective hyperthermia, no subjective hypothermia   Objective:    BP 118/76   Pulse 84   Ht 5' 5" (1.651 m)   Wt 170 lb 9.6 oz (77.4 kg)   BMI 28.39 kg/m   Wt Readings from Last 3 Encounters:  07/23/21 170 lb 9.6 oz (77.4 kg)  05/08/21 173 lb (78.5 kg)  04/28/21 171 lb 3.2 oz (77.7 kg)    Physical Exam  Constitutional: + Overweight for height, not in acute distress, normal state of mind.    Eyes: PERRLA, EOMI, no exophthalmos ENT:  + Repaired cleft lip,  moist mucous membranes, + no gross thyromegaly,  no cervical lymphadenopathy.     Recent Results (from the past 2160 hour(s))  CBC with Differential     Status: Abnormal   Collection Time: 04/28/21  3:11 PM  Result Value Ref Range   WBC 5.2 3.4 - 10.8 x10E3/uL   RBC 5.62 (H) 3.77 - 5.28 x10E6/uL   Hemoglobin 15.7 11.1 - 15.9 g/dL   Hematocrit 46.9 (H) 34.0 - 46.6 %   MCV 84 79 - 97 fL   MCH 27.9 26.6 - 33.0 pg   MCHC 33.5 31.5 - 35.7 g/dL   RDW 13.8 11.7 - 15.4 %   Platelets 163 150 - 450 x10E3/uL   Neutrophils 57 Not Estab. %   Lymphs 29 Not Estab. %   Monocytes 11 Not Estab. %   Eos 2 Not Estab. %    Basos 1 Not Estab. %   Neutrophils Absolute 3.0 1.4 - 7.0 x10E3/uL   Lymphocytes Absolute 1.5 0.7 - 3.1 x10E3/uL   Monocytes Absolute 0.6 0.1 - 0.9 x10E3/uL   EOS (ABSOLUTE) 0.1 0.0 - 0.4 x10E3/uL   Basophils Absolute 0.0 0.0 - 0.2 x10E3/uL   Immature Granulocytes 0 Not Estab. %   Immature Grans (Abs) 0.0 0.0 - 0.1 x10E3/uL  Comprehensive metabolic panel     Status: Abnormal   Collection Time: 04/28/21  3:11 PM  Result Value Ref Range   Glucose 84 70 - 99 mg/dL   BUN 12 6 - 20 mg/dL   Creatinine, Ser 0.94 0.57 - 1.00 mg/dL   eGFR 82 >59 mL/min/1.73   BUN/Creatinine Ratio 13 9 - 23   Sodium 141 134 - 144 mmol/L   Potassium 4.1 3.5 - 5.2 mmol/L   Chloride 101 96 - 106 mmol/L   CO2 27 20 - 29 mmol/L   Calcium 10.0 8.7 - 10.2 mg/dL   Total Protein 8.1 6.0 - 8.5 g/dL   Albumin 4.9 (H) 3.8 - 4.8 g/dL   Globulin, Total 3.2 1.5 - 4.5 g/dL   Albumin/Globulin Ratio 1.5 1.2 - 2.2   Bilirubin Total 0.4 0.0 - 1.2 mg/dL   Alkaline Phosphatase 125 (H) 44 - 121 IU/L   AST 19 0 - 40 IU/L   ALT 16 0 - 32 IU/L  Hemoglobin A1c     Status: None   Collection Time: 04/28/21  3:11 PM  Result Value Ref Range   Hgb A1c MFr Bld 4.9 4.8 - 5.6 %    Comment:          Prediabetes: 5.7 - 6.4          Diabetes: >6.4          Glycemic control for adults with diabetes: <7.0    Est. average glucose Bld gHb Est-mCnc 94 mg/dL  VITAMIN D 25 Hydroxy (Vit-D Deficiency, Fractures)     Status: None   Collection Time: 04/28/21  3:11 PM  Result Value Ref Range   Vit D, 25-Hydroxy 65.2 30.0 - 100.0 ng/mL    Comment: Vitamin D deficiency has been defined by the Institute of Medicine and an Endocrine Society practice guideline as a level of serum 25-OH vitamin D less than 20 ng/mL (1,2). The Endocrine Society went on to further define vitamin D insufficiency as a level between 21 and 29 ng/mL (2). 1. IOM (Institute of Medicine). 2010. Dietary reference    intakes for calcium and D. [place]: The    [employer]. 2. Holick MF, Binkley [place], Bischoff-Ferrari HA, et al.    Evaluation, treatment, and prevention of vitamin D    deficiency: an Endocrine Society clinical practice    guideline. JCEM. 2011 Jul; 96(7):1911-30.   B12 and Folate Panel     Status: Abnormal   Collection Time: 04/28/21  3:11 PM  Result Value Ref Range   Vitamin B-12 1,553 (H) 232 - 1,245 pg/mL   Folate >20.0 >3.0 ng/mL    Comment: A serum folate concentration of less than 3.1 ng/mL is considered to represent clinical deficiency.   Direct LDL     Status: None   Collection Time: 04/28/21  3:11 PM  Result Value Ref Range   LDL Direct 90 0 - 99 mg/dL  Cytology - PAP( [place])     Status: None   Collection Time: 05/08/21 10:44 AM  Result Value Ref Range   High risk HPV Negative    Neisseria Gonorrhea Negative    Chlamydia Negative    Adequacy      Satisfactory for evaluation; transformation zone component PRESENT.   Diagnosis      - Negative for intraepithelial lesion or malignancy (NILM)   Comment Normal Reference Range HPV - Negative    Comment Normal Reference [place] Chlamydia - Negative    Comment      Normal Reference Range Neisseria Gonorrhea - Negative  TSH  Status: Abnormal   Collection Time: 07/21/21  4:01 PM  Result Value Ref Range   TSH 0.010 (L) 0.450 - 4.500 uIU/mL  T4, free     Status: Abnormal   Collection Time: 07/21/21  4:01 PM  Result Value Ref Range   Free T4 2.33 (H) 0.82 - 1.77 ng/dL  T3, free     Status: Abnormal   Collection Time: 07/21/21  4:01 PM  Result Value Ref Range   T3, Free 7.0 (H) 2.0 - 4.4 pg/mL  VITAMIN D 25 Hydroxy (Vit-D Deficiency, Fractures)     Status: None   Collection Time: 07/21/21  4:01 PM  Result Value Ref Range   Vit D, 25-Hydroxy 55.5 30.0 - 100.0 ng/mL    Comment: Vitamin D deficiency has been defined by the Institute of Medicine and an Endocrine Society practice guideline as a level of serum 25-OH vitamin D less than 20 ng/mL (1,2). The  Endocrine Society went on to further define vitamin D insufficiency as a level between 21 and 29 ng/mL (2). 1. IOM (Institute of Medicine). 2010. Dietary reference    intakes for calcium and D. [place]: The    [employer]. 2. Holick MF, Binkley [place], Bischoff-Ferrari HA, et al.    Evaluation, treatment, and prevention of vitamin D    deficiency: an Endocrine Society clinical practice    guideline. JCEM. 2011 Jul; 96(7):1911-30.      On 09/30/2016 she underwent thyroid uptake and scan which showed 26% (normal 10-30 percent) uniform uptake with no nodularity.  Assessment & Plan:    1. Hyperthyroidism -Her previsit thyroid function tests are reversing to hyperthyroidism. She will be considered for thyroid uptake and scan to confirms primary hyperthyroidism. She did have unremarkable thyroid uptake and scan in 2018 , but has positive anti-thyroid antibodies.   2. Vitamin D deficiency She is now vitamin D replete at  55 , she only need maintenance dose . She was previously found to have hypervitaminosis b12. Advised to hold.\  - I advised patient to maintain close follow up with Early, Sara E, NP for primary care needs.   I spent 21 minutes in the care of the patient today including review of labs from Thyroid Function, CMP, and other relevant labs ; imaging/biopsy records (current and previous including abstractions from other facilities); face-to-face time discussing  her lab results and symptoms, medications doses, her options of short and long term treatment based on the latest standards of care / guidelines;   and documenting the encounter.  Ledonna Fors  participated in the discussions, expressed understanding, and voiced agreement with the above plans.  All questions were answered to her satisfaction. she is encouraged to contact clinic should she have any questions or concerns prior to her return visit.   Follow up plan: Return in about 2 weeks (around  08/06/2021) for F/U with Thyroid Uptake and Scan.  Gebre Nida, MD Phone: [phone]  Fax: [phone]   07/23/2021, 4:59 PM 

## 2021-07-30 ENCOUNTER — Ambulatory Visit: Payer: 59 | Admitting: Gastroenterology

## 2021-08-05 ENCOUNTER — Telehealth: Payer: Self-pay | Admitting: "Endocrinology

## 2021-08-05 NOTE — Telephone Encounter (Signed)
Kimberly Ramsey with [facility] said that per [facility] it is only approved for one scan and it is needs to be updated. He said that he has never heard of this. Please advise, her scan is tomorrow

## 2021-08-05 NOTE — Telephone Encounter (Signed)
There are 2 Prior auths on this test. Both PA #'s are noted in the chart

## 2021-08-06 ENCOUNTER — Encounter (HOSPITAL_COMMUNITY): Payer: Self-pay

## 2021-08-06 ENCOUNTER — Encounter (HOSPITAL_COMMUNITY)
Admission: RE | Admit: 2021-08-06 | Discharge: 2021-08-06 | Disposition: A | Discharge status: Home / Self Care | Payer: 59 | Source: Ambulatory Visit | Attending: "Endocrinology | Admitting: "Endocrinology

## 2021-08-06 DIAGNOSIS — E0590 Thyrotoxicosis, unspecified without thyrotoxic crisis or storm: Secondary | ICD-10-CM | POA: Diagnosis present

## 2021-08-06 MED ORDER — SODIUM IODIDE I-123 7.4 MBQ CAPS
300.0000 | ORAL_CAPSULE | Freq: Once | ORAL | Status: AC
Start: 2021-08-06 — End: 2021-08-06
  Administered 2021-08-06: 308 via ORAL

## 2021-08-07 ENCOUNTER — Encounter (HOSPITAL_COMMUNITY)
Admission: RE | Admit: 2021-08-07 | Discharge: 2021-08-07 | Disposition: A | Discharge status: Home / Self Care | Payer: 59 | Source: Ambulatory Visit | Attending: "Endocrinology | Admitting: "Endocrinology

## 2021-08-13 ENCOUNTER — Ambulatory Visit: Payer: 59 | Admitting: "Endocrinology

## 2021-08-13 ENCOUNTER — Encounter: Payer: Self-pay | Admitting: "Endocrinology

## 2021-08-13 VITALS — BP 116/76 | HR 100 | Ht 65.0 in | Wt 172.8 lb

## 2021-08-13 DIAGNOSIS — E041 Nontoxic single thyroid nodule: Secondary | ICD-10-CM

## 2021-08-13 DIAGNOSIS — E559 Vitamin D deficiency, unspecified: Secondary | ICD-10-CM | POA: Diagnosis not present

## 2021-08-13 DIAGNOSIS — E0590 Thyrotoxicosis, unspecified without thyrotoxic crisis or storm: Secondary | ICD-10-CM

## 2021-08-13 NOTE — Progress Notes (Signed)
08/13/2021  Endocrinology follow-up note   Subjective:    Patient ID: Kimberly Ramsey, female    DOB: 12/22/1985, PCP Early, Coralee Pesa, NP   Past Medical History:  Diagnosis Date   Cleft lip and palate    Down syndrome in child of prior pregnancy, currently pregnant 01/16/2013   36yo daughter, echo normal, no complications   Encounter for IUD insertion 10/03/2013   Mirena 10/03/13           08/01/18 Removal, insertion Liletta   Rectal bleeding 06/29/2017   Thrombocytopenia complicating pregnancy ([place])    Thyroid disease    followed by Dr. Dorris Fetch   Past Surgical History:  Procedure Laterality Date   CLEFT PALATE REPAIR     COLONOSCOPY N/A 07/21/2017   TI normal, torturous colon, internal hemorrhoids s/p banding. External hemorrhoids. Repeat colonoscopy in 5 years.    HEMORRHOID BANDING N/A 07/21/2017   Procedure: Thayer Jew;  Surgeon: Danie Binder, MD;  [facility];  Service: Endoscopy;  Laterality: N/A;   HIP SURGERY     took bone from hip to fix cleft palate.   Social History   Socioeconomic History   Marital status: Single    Spouse name: Not on file   Number of children: Not on file   Years of education: Not on file   Highest education level: Not on file  Occupational History   Not on file  Tobacco Use   Smoking status: Never   Smokeless tobacco: Never  Vaping Use   Vaping Use: Never used  Substance and Sexual Activity   Alcohol use: No   Drug use: No   Sexual activity: Yes    Birth control/protection: I.U.D.  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   Social Determinants of Health   Financial Resource Strain: Low Risk  (05/08/2021)   Overall Financial Resource Strain (CARDIA)    Difficulty of Paying Living Expenses: Not very hard  Food Insecurity: No Food Insecurity (05/08/2021)   Hunger Vital Sign    Worried About Running Out of  Food in the Last Year: Never true    Ran Out of Food in the Last Year: Never true  Transportation Needs: No Transportation Needs (05/08/2021)   PRAPARE - Hydrologist (Medical): No    Lack of Transportation (Non-Medical): No  Physical Activity: Sufficiently Active (05/08/2021)   Exercise Vital Sign    Days of Exercise per Week: 5 days    Minutes of Exercise per Session: 60 min  Stress: No Stress Concern Present (05/08/2021)   [place]    Feeling of Stress : Not at all  Social Connections: Moderately Isolated (05/08/2021)   Social Connection and Isolation Panel [NHANES]    Frequency of Communication with Friends and Family: Three times a week    Frequency of Social Gatherings with Friends and Family: Three times a week    Attends Religious Services: 1 to 4 times per year    Active Member of Clubs or Organizations: No    Attends Archivist Meetings: Never    Marital Status: Never married   Outpatient Encounter Medications as of 08/13/2021  Medication Sig   Hydrocortisone 2.5 % KIT Apply rectally BID PRN   BIOTIN PO Take 1 capsule by mouth daily.   Cholecalciferol (VITAMIN D3 PO) Take  1 tablet by mouth daily in the afternoon.   levonorgestrel (LILETTA, 52 MG,) 19.5 MCG/DAY IUD IUD 1 each by Intrauterine route once.   Multiple Vitamins-Minerals (MULTIVITAMIN WOMEN PO) Take 1 tablet by mouth daily in the afternoon.   No facility-administered encounter medications on file as of 08/13/2021.   ALLERGIES: Allergies  Allergen Reactions   Codeine Nausea And Vomiting    VACCINATION STATUS: Immunization History  Administered Date(s) Administered   PPD Test 01/05/2017   Tdap 08/18/2013    HPI 36 year old female patient with medical history as above. She is being seen in follow-up with repeat thyroid function tests after she was seen in consultation for Hashimoto's thyroiditis.  She is not  currently on thyroid hormone supplement or antithyroid intervention.   Because of elevated thyroid hormone during her last visit, she was considered for thyroid uptake and scan which confirms primary hyperthyroidism.  She is not currently on antithyroid medications. - She denies palpitations, heat intolerance, tremors.  She has a steady weight since last visit.    She has normal menstrual cycles, mother of 3 children. She denies any family history of thyroid dysfunction. - She denies dysphagia, shortness of breath, voice change. She is on vitamin D supplement started due to vitamin D deficiency.   Review of Systems  Review of systems  Constitutional: + Minimally fluctuating body weight,  current  Body mass index is 28.76 kg/m. , no fatigue, no subjective hyperthermia, no subjective hypothermia   Objective:    BP 116/76   Pulse 100   Ht _0  (1.651 m)   Wt 172 lb 12.8 oz (78.4 kg)   LMP 07/30/2021 (Within Days)   BMI 28.76 kg/m   Wt Readings from Last 3 Encounters:  08/13/21 172 lb 12.8 oz (78.4 kg)  07/23/21 170 lb 9.6 oz (77.4 kg)  05/08/21 173 lb (78.5 kg)    Physical Exam  Constitutional: + Overweight for height, not in acute distress, normal state of mind.    Eyes: PERRLA, EOMI, no exophthalmos ENT:  + Repaired cleft lip,  moist mucous membranes, + no gross thyromegaly,  no cervical lymphadenopathy.     Recent Results (from the past 2160 hour(s))  TSH     Status: Abnormal   Collection Time: 07/21/21  4:01 PM  Result Value Ref Range   TSH 0.010 (L) 0.450 - 4.500 uIU/mL  T4, free     Status: Abnormal   Collection Time: 07/21/21  4:01 PM  Result Value Ref Range   Free T4 2.33 (H) 0.82 - 1.77 ng/dL  T3, free     Status: Abnormal   Collection Time: 07/21/21  4:01 PM  Result Value Ref Range   T3, Free 7.0 (H) 2.0 - 4.4 pg/mL  VITAMIN D 25 Hydroxy (Vit-D Deficiency, Fractures)     Status: None   Collection Time: 07/21/21  4:01 PM  Result Value Ref Range   Vit D,  25-Hydroxy 55.5 30.0 - 100.0 ng/mL    Comment: Vitamin D deficiency has been defined by the [place] and an Endocrine Society practice guideline as a level of serum 25-OH vitamin D less than 20 ng/mL (1,2). The Endocrine Society went on to further define vitamin D insufficiency as a level between 21 and 29 ng/mL (2). 1. IOM (Institute of Medicine). 2010. Dietary reference    intakes for calcium and D. [place]: The    [employer]. 2. Holick MF, Binkley [place], Bischoff-Ferrari HA, et al.    Evaluation,  treatment, and prevention of vitamin D    deficiency: an Endocrine Society clinical practice    guideline. JCEM. 2011 Jul; 96(7):1911-30.      On 09/30/2016 she underwent thyroid uptake and scan which showed 26% (normal 10-30 percent) uniform uptake with no nodularity.  Thyroid uptake and scan on August 07, 2021: FINDINGS: Cold nodule inferior pole RIGHT lobe.   No additional warm or cold nodules identified.   4 hour I-123 uptake = 30.6% (normal 5-20%), 24 hour I-123 uptake = 47.1% (normal 10-30%)   IMPRESSION: Elevated 4 hour and 24 hour radio iodine uptakes consistent with hyperthyroidism.   Cold nodule at inferior pole RIGHT thyroid lobe; thyroid ultrasound evaluation recommended to exclude mass/malignancy.  Assessment & Plan:    1. Hyperthyroidism  2.  Left lobe cold nodule -Her recent  thyroid function tests are reversing to hyperthyroidism.  Her previsit thyroid uptake and scan shows primary hyperthyroidism with increased uptake of 47% at 24 hours.  She does have a cold nodule on the left lobe of her thyroid, will be considered for baseline thyroid ultrasound.   She did have unremarkable thyroid uptake and scan in 2018 , but has positive anti-thyroid antibodies. She is made aware of the fact that RAI induces hypothyroidism Which will need lifelong replacement of thyroid hormone.  3. Vitamin D deficiency She is now vitamin D replete at  75 , she  only need maintenance dose . She was previously found to have hypervitaminosis b12. Advised to hold.\  - I advised patient to maintain close follow up with Early, Coralee Pesa, NP for primary care needs.   I spent 21 minutes in the care of the patient today including review of labs from Thyroid Function, CMP, and other relevant labs ; imaging/biopsy records (current and previous including abstractions from other facilities); face-to-face time discussing  her lab results and symptoms, medications doses, her options of short and long term treatment based on the latest standards of care / guidelines;   and documenting the encounter.  Kimberly Ramsey  participated in the discussions, expressed understanding, and voiced agreement with the above plans.  All questions were answered to her satisfaction. she is encouraged to contact clinic should she have any questions or concerns prior to her return visit.    Follow up plan: Return in about 9 weeks (around 10/15/2021) for F/U with Labs after I131 Therapy.  Glade Lloyd, MD Phone: [phone]  Fax: [phone]   08/13/2021, 4:29 PM

## 2021-08-14 NOTE — Written Directive (Addendum)
MOLECULAR IMAGING AND THERAPEUTICS WRITTEN DIRECTIVE   PATIENT NAME: Kimberly Ramsey  PT DOB:   11-Jul-1985                                              MRN: [id number]  ---------------------------------------------------------------------------------------------------------------------   I-131 WHOLE THYROID THERAPY (NON-CANCER)    RADIOPHARMACEUTICAL:   Iodine-131 Capsule  NaI   PRESCRIBED DOSE FOR ADMINISTRATION: 18 mCi  (eighteen milliCuries)  please see additional comment below   ROUTE OFADMINISTRATION: PO   DIAGNOSIS:  Hyperthyroidism   REFERRING PHYSICIAN: Dr. Fransico Him   TSH:    Lab Results  Component Value Date   TSH 0.010 (L) 07/21/2021   TSH 0.400 (L) 03/27/2021   TSH 0.42 08/07/2020     PRIOR I-131 THERAPY (Date and Dose):   PRIOR RADIOLOGY EXAMS (Results and Date): NM THYROID MULT UPTAKE W/IMAGING  Result Date: 08/07/2021 CLINICAL DATA:  Hyperthyroidism, suppressed TSH EXAM: THYROID SCAN AND UPTAKE - 4 AND 24 HOURS TECHNIQUE: Following oral administration of I-123 capsule, anterior planar imaging was acquired at 24 hours. Thyroid uptake was calculated with a thyroid probe at 4-6 hours and 24 hours. RADIOPHARMACEUTICALS:  308 uCi I-123 sodium iodide p.o. COMPARISON:  None Available. FINDINGS: Cold nodule inferior pole RIGHT lobe. No additional warm or cold nodules identified. 4 hour I-123 uptake = 30.6% (normal 5-20%) 24 hour I-123 uptake = 47.1% (normal 10-30%) IMPRESSION: Elevated 4 hour and 24 hour radio iodine uptakes consistent with hyperthyroidism. Cold nodule at inferior pole RIGHT thyroid lobe; thyroid ultrasound evaluation recommended to exclude mass/malignancy. Electronically Signed   By: Ulyses Southward M.D.   On: 08/07/2021 10:56      ADDITIONAL PHYSICIAN COMMENTS/NOTES  *NEEDS thyroid US workup of cold nodule at inferior pole right lobe prior to I-131 therapy for hyperthyroidism*   AUTHORIZED USER SIGNATURE & TIME STAMP:

## 2021-08-19 ENCOUNTER — Ambulatory Visit (HOSPITAL_COMMUNITY): Payer: 59

## 2021-08-21 ENCOUNTER — Inpatient Hospital Stay (HOSPITAL_COMMUNITY)
Admission: RE | Admit: 2021-08-21 | Discharge: 2021-08-21 | Disposition: A | Discharge status: Home / Self Care | Payer: 59 | Source: Ambulatory Visit | Attending: "Endocrinology | Admitting: "Endocrinology

## 2021-08-21 DIAGNOSIS — E0590 Thyrotoxicosis, unspecified without thyrotoxic crisis or storm: Secondary | ICD-10-CM

## 2021-08-27 ENCOUNTER — Ambulatory Visit (HOSPITAL_COMMUNITY)
Admission: RE | Admit: 2021-08-27 | Discharge: 2021-08-27 | Disposition: A | Discharge status: Home / Self Care | Payer: 59 | Source: Ambulatory Visit | Attending: "Endocrinology | Admitting: "Endocrinology

## 2021-08-27 DIAGNOSIS — E041 Nontoxic single thyroid nodule: Secondary | ICD-10-CM | POA: Insufficient documentation

## 2021-08-31 ENCOUNTER — Other Ambulatory Visit: Payer: Self-pay | Admitting: "Endocrinology

## 2021-08-31 DIAGNOSIS — E041 Nontoxic single thyroid nodule: Secondary | ICD-10-CM

## 2021-09-02 ENCOUNTER — Ambulatory Visit (HOSPITAL_COMMUNITY)
Admission: RE | Admit: 2021-09-02 | Discharge: 2021-09-02 | Disposition: A | Discharge status: Home / Self Care | Payer: 59 | Source: Ambulatory Visit | Attending: "Endocrinology | Admitting: "Endocrinology

## 2021-09-02 ENCOUNTER — Encounter (HOSPITAL_COMMUNITY): Payer: Self-pay

## 2021-09-02 DIAGNOSIS — E041 Nontoxic single thyroid nodule: Secondary | ICD-10-CM | POA: Insufficient documentation

## 2021-09-02 MED ORDER — LIDOCAINE HCL (PF) 2 % IJ SOLN
INTRAMUSCULAR | Status: AC
  Administered 2021-09-02: 10 mL
  Filled 2021-09-02: qty 10

## 2021-09-02 NOTE — Progress Notes (Signed)
PT tolerated thyroid biopsy procedure well today. Labs and afirma obtained and sent for pathology. PT ambulatory at discharge with no acute distress noted and verbalized understanding of discharge instructions. 

## 2021-09-04 ENCOUNTER — Encounter (HOSPITAL_COMMUNITY): Payer: 59

## 2021-09-04 LAB — CYTOLOGY - NON PAP

## 2021-09-08 ENCOUNTER — Telehealth: Payer: Self-pay | Admitting: "Endocrinology

## 2021-09-08 NOTE — Telephone Encounter (Signed)
Dr Fransico Him Patient is in need of intermediate FMLA due to her RAI that is Friday 8/4.  He supervisor is giving her a hard time about taking off and is not allowing it. She said when she had her biopsy last week, she went right into work in pain and was hurting. She said she feels like she needs this just to take for this to protect her job. She does not feel like she should have to rs this

## 2021-09-09 NOTE — Telephone Encounter (Signed)
Called pt, she stated she spoke with Alycia Rossetti from [employer] which told her she would need to be out of work for a total of 4 days. Pt states her treatment is scheduled for Friday.

## 2021-09-09 NOTE — Telephone Encounter (Signed)
Joy, Patient is asking for you to call her

## 2021-09-09 NOTE — Telephone Encounter (Signed)
Discussed with pt, understanding voiced. 

## 2021-09-11 ENCOUNTER — Encounter (HOSPITAL_COMMUNITY): Payer: Self-pay

## 2021-09-11 ENCOUNTER — Encounter (HOSPITAL_COMMUNITY)
Admission: RE | Admit: 2021-09-11 | Discharge: 2021-09-11 | Disposition: A | Discharge status: Home / Self Care | Payer: 59 | Source: Ambulatory Visit | Attending: "Endocrinology | Admitting: "Endocrinology

## 2021-09-11 ENCOUNTER — Other Ambulatory Visit (HOSPITAL_COMMUNITY)
Admission: RE | Admit: 2021-09-11 | Discharge: 2021-09-11 | Disposition: A | Discharge status: Home / Self Care | Payer: 59 | Source: Ambulatory Visit | Attending: "Endocrinology | Admitting: "Endocrinology

## 2021-09-11 DIAGNOSIS — E0590 Thyrotoxicosis, unspecified without thyrotoxic crisis or storm: Secondary | ICD-10-CM | POA: Insufficient documentation

## 2021-09-11 DIAGNOSIS — E041 Nontoxic single thyroid nodule: Secondary | ICD-10-CM | POA: Insufficient documentation

## 2021-09-11 LAB — PREGNANCY, URINE: Preg Test, Ur: NEGATIVE

## 2021-09-11 MED ORDER — SODIUM IODIDE I 131 CAPSULE
18.0000 | Freq: Once | INTRAVENOUS | Status: AC | PRN
  Administered 2021-09-11: 17.3 via ORAL

## 2021-09-14 ENCOUNTER — Telehealth: Payer: Self-pay | Admitting: "Endocrinology

## 2021-09-14 NOTE — Telephone Encounter (Signed)
New message    Checking on the status of [employer] paperwork.   FMLA contacted her voiced they never received the paperwork.

## 2021-09-14 NOTE — Telephone Encounter (Signed)
Left a message requesting pt return call to the office. ?

## 2021-09-23 ENCOUNTER — Ambulatory Visit: Payer: 59 | Admitting: Gastroenterology

## 2021-10-08 ENCOUNTER — Ambulatory Visit: Payer: 59 | Admitting: Gastroenterology

## 2021-10-15 ENCOUNTER — Ambulatory Visit: Payer: 59 | Admitting: "Endocrinology

## 2021-10-21 LAB — T4, FREE: Free T4: 1.4 ng/dL (ref 0.82–1.77)

## 2021-10-21 LAB — T3, FREE: T3, Free: 4 pg/mL (ref 2.0–4.4)

## 2021-10-21 LAB — TSH: TSH: 0.005 u[IU]/mL — ABNORMAL LOW (ref 0.450–4.500)

## 2021-10-22 ENCOUNTER — Encounter: Payer: Self-pay | Admitting: "Endocrinology

## 2021-10-22 ENCOUNTER — Ambulatory Visit: Payer: 59 | Admitting: "Endocrinology

## 2021-10-22 VITALS — BP 102/72 | HR 72 | Ht 65.0 in | Wt 170.0 lb

## 2021-10-22 DIAGNOSIS — E0590 Thyrotoxicosis, unspecified without thyrotoxic crisis or storm: Secondary | ICD-10-CM

## 2021-10-22 NOTE — Progress Notes (Signed)
10/22/2021  Endocrinology follow-up note   Subjective:    Patient ID: Kimberly Ramsey, female    DOB: 17-Dec-1985, PCP Early, Kimberly Pesa, NP   Past Medical History:  Diagnosis Date   Cleft lip and palate    Down syndrome in child of prior pregnancy, currently pregnant 01/16/2013   36yo daughter, echo normal, no complications   Encounter for IUD insertion 10/03/2013   Mirena 10/03/13           08/01/18 Removal, insertion Liletta   Rectal bleeding 06/29/2017   Thrombocytopenia complicating pregnancy ([place])    Thyroid disease    followed by Dr. Dorris Fetch   Past Surgical History:  Procedure Laterality Date   CLEFT PALATE REPAIR     COLONOSCOPY N/A 07/21/2017   TI normal, torturous colon, internal hemorrhoids s/p banding. External hemorrhoids. Repeat colonoscopy in 5 years.    HEMORRHOID BANDING N/A 07/21/2017   Procedure: Thayer Jew;  Surgeon: Danie Binder, MD;  [facility];  Service: Endoscopy;  Laterality: N/A;   HIP SURGERY     took bone from hip to fix cleft palate.   Social History   Socioeconomic History   Marital status: Single    Spouse name: Not on file   Number of children: Not on file   Years of education: Not on file   Highest education level: Not on file  Occupational History   Not on file  Tobacco Use   Smoking status: Never   Smokeless tobacco: Never  Vaping Use   Vaping Use: Never used  Substance and Sexual Activity   Alcohol use: No   Drug use: No   Sexual activity: Yes    Birth control/protection: I.U.D.  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   Social Determinants of Health   Financial Resource Strain: Low Risk  (05/08/2021)   Overall Financial Resource Strain (CARDIA)    Difficulty of Paying Living Expenses: Not very hard  Food Insecurity: No Food Insecurity (05/08/2021)   Hunger Vital Sign    Worried About Running Out  of Food in the Last Year: Never true    Ran Out of Food in the Last Year: Never true  Transportation Needs: No Transportation Needs (05/08/2021)   PRAPARE - Hydrologist (Medical): No    Lack of Transportation (Non-Medical): No  Physical Activity: Sufficiently Active (05/08/2021)   Exercise Vital Sign    Days of Exercise per Week: 5 days    Minutes of Exercise per Session: 60 min  Stress: No Stress Concern Present (05/08/2021)   [place]    Feeling of Stress : Not at all  Social Connections: Moderately Isolated (05/08/2021)   Social Connection and Isolation Panel [NHANES]    Frequency of Communication with Friends and Family: Three times a week    Frequency of Social Gatherings with Friends and Family: Three times a week    Attends Religious Services: 1 to 4 times per year    Active Member of Clubs or Organizations: No    Attends Archivist Meetings: Never    Marital Status: Never married   Outpatient Encounter Medications as of 10/22/2021  Medication Sig   BIOTIN PO Take 1 capsule by mouth daily.   Cholecalciferol (VITAMIN D3 PO) Take 1 tablet by mouth daily in the afternoon.  Hydrocortisone 2.5 % KIT Apply rectally BID PRN   levonorgestrel (LILETTA, 52 MG,) 19.5 MCG/DAY IUD IUD 1 each by Intrauterine route once.   Multiple Vitamins-Minerals (MULTIVITAMIN WOMEN PO) Take 1 tablet by mouth daily in the afternoon.   No facility-administered encounter medications on file as of 10/22/2021.   ALLERGIES: Allergies  Allergen Reactions   Codeine Nausea And Vomiting    VACCINATION STATUS: Immunization History  Administered Date(s) Administered   PPD Test 01/05/2017   Tdap 08/18/2013    HPI 36 year old female patient with medical history as above. She is being seen in follow-up with repeat thyroid function tests after she was seen in consultation for hyperthyroidism secondary to  Drakes' disease.  She is status post radioactive iodine ablation after a negative biopsy for cold nodule.  She feels better, her previsit labs are not in the hypothyroid range yet.    She is not currently on antithyroid medications. - She denies palpitations, heat intolerance, tremors.  She has a steady weight since last visit.    She has normal menstrual cycles, mother of 3 children. She denies any family history of thyroid dysfunction. - She denies dysphagia, shortness of breath, voice change. She is on vitamin D supplement started due to vitamin D deficiency.   Review of Systems  Review of systems  Constitutional: + Minimally fluctuating body weight,  current  Body mass index is 28.29 kg/m. , no fatigue, no subjective hyperthermia, no subjective hypothermia   Objective:    BP 102/72   Pulse 72   Ht $R'5\' 5"'dj$  (1.651 m)   Wt 170 lb (77.1 kg)   BMI 28.29 kg/m   Wt Readings from Last 3 Encounters:  10/22/21 170 lb (77.1 kg)  08/13/21 172 lb 12.8 oz (78.4 kg)  07/23/21 170 lb 9.6 oz (77.4 kg)    Physical Exam  Constitutional: + Overweight for height, not in acute distress, normal state of mind.    Eyes: PERRLA, EOMI, no exophthalmos ENT:  + Repaired cleft lip,  moist mucous membranes, + no gross thyromegaly,  no cervical lymphadenopathy.     Recent Results (from the past 2160 hour(s))  Cytology - Non PAP;     Status: None   Collection Time: 09/02/21 10:40 AM  Result Value Ref Range   CYTOLOGY - NON GYN      CYTOLOGY - NON PAP CASE: [id number] PATIENT: Kimberly Ramsey Non-Gynecological Cytology Report     Clinical History: 2.0 cm RLL Specimen Submitted:  A. THYROID, RLL, FINE NEEDLE ASPIRATION:   FINAL MICROSCOPIC DIAGNOSIS: - Consistent with benign follicular nodule (Bethesda category II)  SPECIMEN ADEQUACY: Satisfactory for evaluation  DIAGNOSTIC COMMENTS: The aspirate shows abundant colloid with scattered variably sized follicular groups consistent with a  benign follicular/colloid nodule.  GROSS: Received is/are 6 slides in 95% Ethyl alcohol and 30 ccs of pale pink Cytolyt solution. (CM:cm) Prepared: Smears:  6 Concentration Method (Thin Prep):  1 Cell Block:  Cell block attempted, not obtained. Additional Studies:  Also there was an Afirma collected.     Final Diagnosis performed by Tobin Chad, MD.   Electronically signed 09/04/2021 Technical component performed at [facility], [place] [address] [place], [place] [address].   Professional component performed at [facility], [place] [address], [place], [place] [address].  Immunohistochemistry Technical component (if applicable) was performed at [facility]. [address] [place], [place], [place] [address].   IMMUNOHISTOCHEMISTRY DISCLAIMER (if applicable): Some of these immunohistochemical stains may have  been developed and the performance characteristics determine by [facility]. Some may not have been cleared or approved by the U.S. Food and Drug Administration. The FDA has determined that such clearance or approval is not necessary. This test is used for clinical purposes. It should not be regarded as investigational or for research. This laboratory is certified under the [place] (CLIA-88) as qualified to perform high complexity clinical laboratory testing.  The controls stained appropriately.   Pregnancy, urine     Status: None   Collection Time: 09/11/21  8:00 AM  Result Value Ref Range   Preg Test, Ur NEGATIVE NEGATIVE    Comment:        THE SENSITIVITY OF THIS METHODOLOGY IS >20 mIU/mL. Performed at [facility], [address], [place], [place] [address]   TSH     Status: Abnormal   Collection Time: 10/20/21 11:18 AM  Result Value Ref Range   TSH 0.005 (L) 0.450 - 4.500 uIU/mL  T4, free     Status: None   Collection Time: 10/20/21 11:18 AM  Result  Value Ref Range   Free T4 1.40 0.82 - 1.77 ng/dL  T3, free     Status: None   Collection Time: 10/20/21 11:18 AM  Result Value Ref Range   T3, Free 4.0 2.0 - 4.4 pg/mL     On 09/30/2016 she underwent thyroid uptake and scan which showed 26% (normal 10-30 percent) uniform uptake with no nodularity.  Thyroid uptake and scan on August 07, 2021: FINDINGS: Cold nodule inferior pole RIGHT lobe.   No additional warm or cold nodules identified.   4 hour I-123 uptake = 30.6% (normal 5-20%), 24 hour I-123 uptake = 47.1% (normal 10-30%)   IMPRESSION: Elevated 4 hour and 24 hour radio iodine uptakes consistent with hyperthyroidism.   Cold nodule at inferior pole RIGHT thyroid lobe; thyroid ultrasound evaluation recommended to exclude mass/malignancy.  He underwent fine-needle aspiration biopsy with benign outcomes.  Assessment & Plan:    1. Hyperthyroidism  2.  Left lobe cold nodule- -she is status post radioactive iodine thyroid ablation for hyperthyroidism with thyroid nodule.  Her treatment was administered on September 11, 2021.  She is improving clinically, her thyroid function tests are approaching normal.  She did not develop hypothyroidism yet, hence will not be initiated on thyroid hormone replacement. She will return in 5 weeks with repeat thyroid function tests. The biopsy results of her old left lobe nodule benign.   She is made aware of the fact that RAI induces hypothyroidism Which will need lifelong replacement of thyroid hormone.  3. Vitamin D deficiency She is now vitamin D replete at  69 , she only need maintenance dose . She was previously found to have hypervitaminosis b12. Advised to hold.\  - I advised patient to maintain close follow up with Early, Kimberly Pesa, NP for primary care needs.  I spent 23 minutes in the care of the patient today including review of labs from Thyroid Function, CMP, and other relevant labs ; imaging/biopsy records (current and previous including  abstractions from other facilities); face-to-face time discussing  her lab results and symptoms, medications doses, her options of short and long term treatment based on the latest standards of care / guidelines;   and documenting the encounter.  Kimberly Ramsey  participated in the discussions, expressed understanding, and voiced agreement with the above plans.  All questions were answered to her satisfaction. she is encouraged to contact clinic  should she have any questions or concerns prior to her return visit.   Follow up plan: Return in about 5 weeks (around 11/26/2021) for F/U with Pre-visit Labs.  Glade Lloyd, MD Phone: [phone]  Fax: [phone]   10/22/2021, 9:24 PM

## 2021-11-24 LAB — VITAMIN B12: Vitamin B-12: 770 pg/mL (ref 232–1245)

## 2021-11-24 LAB — T4, FREE: Free T4: 1.1 ng/dL (ref 0.82–1.77)

## 2021-11-24 LAB — TSH: TSH: 0.006 u[IU]/mL — ABNORMAL LOW (ref 0.450–4.500)

## 2021-11-26 ENCOUNTER — Encounter: Payer: Self-pay | Admitting: "Endocrinology

## 2021-11-26 ENCOUNTER — Ambulatory Visit: Payer: 59 | Admitting: "Endocrinology

## 2021-11-26 VITALS — BP 114/86 | HR 76 | Ht 65.0 in | Wt 172.4 lb

## 2021-11-26 DIAGNOSIS — E559 Vitamin D deficiency, unspecified: Secondary | ICD-10-CM

## 2021-11-26 DIAGNOSIS — E0590 Thyrotoxicosis, unspecified without thyrotoxic crisis or storm: Secondary | ICD-10-CM | POA: Diagnosis not present

## 2021-11-26 NOTE — Progress Notes (Signed)
11/26/2021  Endocrinology follow-up note   Subjective:    Patient ID: Kimberly Ramsey, female    DOB: Jan 06, 1986, PCP Early, Coralee Pesa, NP   Past Medical History:  Diagnosis Date   Cleft lip and palate    Down syndrome in child of prior pregnancy, currently pregnant 01/16/2013   36yo daughter, echo normal, no complications   Encounter for IUD insertion 10/03/2013   Mirena 10/03/13           08/01/18 Removal, insertion Liletta   Rectal bleeding 06/29/2017   Thrombocytopenia complicating pregnancy ([place])    Thyroid disease    followed by Dr. Dorris Ramsey   Past Surgical History:  Procedure Laterality Date   CLEFT PALATE REPAIR     COLONOSCOPY N/A 07/21/2017   TI normal, torturous colon, internal hemorrhoids s/p banding. External hemorrhoids. Repeat colonoscopy in 5 years.    HEMORRHOID BANDING N/A 07/21/2017   Procedure: Kimberly Ramsey;  Surgeon: Kimberly Binder, MD;  [facility];  Service: Endoscopy;  Laterality: N/A;   HIP SURGERY     took bone from hip to fix cleft palate.   Social History   Socioeconomic History   Marital status: Single    Spouse name: Not on file   Number of children: Not on file   Years of education: Not on file   Highest education level: Not on file  Occupational History   Not on file  Tobacco Use   Smoking status: Never   Smokeless tobacco: Never  Vaping Use   Vaping Use: Never used  Substance and Sexual Activity   Alcohol use: No   Drug use: No   Sexual activity: Yes    Birth control/protection: I.U.D.  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   Social Determinants of Health   Financial Resource Strain: Low Risk  (05/08/2021)   Overall Financial Resource Strain (CARDIA)    Difficulty of Paying Living Expenses: Not very hard  Food Insecurity: No Food Insecurity (05/08/2021)   Hunger Vital Sign    Worried About Running Out  of Food in the Last Year: Never true    Ran Out of Food in the Last Year: Never true  Transportation Needs: No Transportation Needs (05/08/2021)   PRAPARE - Hydrologist (Medical): No    Lack of Transportation (Non-Medical): No  Physical Activity: Sufficiently Active (05/08/2021)   Exercise Vital Sign    Days of Exercise per Week: 5 days    Minutes of Exercise per Session: 60 min  Stress: No Stress Concern Present (05/08/2021)   [place]    Feeling of Stress : Not at all  Social Connections: Moderately Isolated (05/08/2021)   Social Connection and Isolation Panel [NHANES]    Frequency of Communication with Friends and Family: Three times a week    Frequency of Social Gatherings with Friends and Family: Three times a week    Attends Religious Services: 1 to 4 times per year    Active Member of Clubs or Organizations: No    Attends Archivist Meetings: Never    Marital Status: Never married   Outpatient Encounter Medications as of 11/26/2021  Medication Sig   BIOTIN PO Take 1 capsule by mouth daily.   Cholecalciferol (VITAMIN D3 PO) Take 1 tablet by mouth daily in the afternoon.  Hydrocortisone 2.5 % KIT Apply rectally BID PRN   levonorgestrel (LILETTA, 52 MG,) 19.5 MCG/DAY IUD IUD 1 each by Intrauterine route once.   Multiple Vitamins-Minerals (MULTIVITAMIN WOMEN PO) Take 1 tablet by mouth daily in the afternoon.   No facility-administered encounter medications on file as of 11/26/2021.   ALLERGIES: Allergies  Allergen Reactions   Codeine Nausea And Vomiting    VACCINATION STATUS: Immunization History  Administered Date(s) Administered   PPD Test 01/05/2017   Tdap 08/18/2013    HPI 36 year old female patient with medical history as above. She is being seen in follow-up with repeat thyroid function tests after she was seen in consultation for hyperthyroidism secondary to  Quillin' disease.  She is status post radioactive iodine ablation after a negative biopsy for cold nodule.  She feels better, her previsit labs are not in  the hypothyroid range yet.    She is not currently on antithyroid medications. - She denies palpitations, heat intolerance, tremors.  She has a steady weight since last visit.    She has normal menstrual cycles, mother of 3 children.  She denies any family history of thyroid dysfunction. - She denies dysphagia, shortness of breath, voice change. She is on vitamin D supplement started due to vitamin D deficiency.  Review of Systems  Review of systems  Constitutional: + Minimally fluctuating body weight,  current  Body mass index is 28.69 kg/m. , no fatigue, no subjective hyperthermia, no subjective hypothermia   Objective:    BP 114/86   Pulse 76   Ht '5\' 5"'  (1.651 m)   Wt 172 lb 6.4 oz (78.2 kg)   BMI 28.69 kg/m   Wt Readings from Last 3 Encounters:  11/26/21 172 lb 6.4 oz (78.2 kg)  10/22/21 170 lb (77.1 kg)  08/13/21 172 lb 12.8 oz (78.4 kg)    Physical Exam  Constitutional: + Overweight for height, not in acute distress, normal state of mind.    Eyes: PERRLA, EOMI, no exophthalmos ENT:  + Repaired cleft lip,  moist mucous membranes, + no gross thyromegaly,  no cervical lymphadenopathy.     Recent Results (from the past 2160 hour(s))  Cytology - Non PAP;     Status: None   Collection Time: 09/02/21 10:40 AM  Result Value Ref Range   CYTOLOGY - NON GYN      CYTOLOGY - NON PAP CASE: [id number] PATIENT: Kimberly Ramsey Non-Gynecological Cytology Report     Clinical History: 2.0 cm RLL Specimen Submitted:  A. THYROID, RLL, FINE NEEDLE ASPIRATION:   FINAL MICROSCOPIC DIAGNOSIS: - Consistent with benign follicular nodule (Bethesda category II)  SPECIMEN ADEQUACY: Satisfactory for evaluation  DIAGNOSTIC COMMENTS: The aspirate shows abundant colloid with scattered variably sized follicular groups  consistent with a benign follicular/colloid nodule.  GROSS: Received is/are 6 slides in 95% Ethyl alcohol and 30 ccs of pale pink Cytolyt solution. (CM:cm) Prepared: Smears:  6 Concentration Method (Thin Prep):  1 Cell Block:  Cell block attempted, not obtained. Additional Studies:  Also there was an Afirma collected.     Final Diagnosis performed by Kimberly Chad, MD.   Electronically signed 09/04/2021 Technical component performed at [facility], [place] [address], [place], [place] [address].   Professional component performed at [facility], [place] [address], [place], [place] [address].  Immunohistochemistry Technical component (if applicable) was performed at [facility]. [address] [place], [place], [place] [address].   IMMUNOHISTOCHEMISTRY DISCLAIMER (if applicable): Some of these immunohistochemical  stains may have been developed and the performance characteristics determine by [facility]. Some may not have been cleared or approved by the U.S. Food and Drug Administration. The FDA has determined that such clearance or approval is not necessary. This test is used for clinical purposes. It should not be regarded as investigational or for research. This laboratory is certified under the [place] (CLIA-88) as qualified to perform high complexity clinical laboratory testing.  The controls stained appropriately.   Pregnancy, urine     Status: None   Collection Time: 09/11/21  8:00 AM  Result Value Ref Range   Preg Test, Ur NEGATIVE NEGATIVE    Comment:        THE SENSITIVITY OF THIS METHODOLOGY IS >20 mIU/mL. Performed at [facility], [address] [place], [place] [address]   TSH     Status: Abnormal   Collection Time: 10/20/21 11:18 AM  Result Value Ref Range   TSH 0.005 (L) 0.450 - 4.500 uIU/mL  T4, free     Status: None   Collection Time: 10/20/21  11:18 AM  Result Value Ref Range   Free T4 1.40 0.82 - 1.77 ng/dL  T3, free     Status: None   Collection Time: 10/20/21 11:18 AM  Result Value Ref Range   T3, Free 4.0 2.0 - 4.4 pg/mL  TSH     Status: Abnormal   Collection Time: 11/23/21  3:56 PM  Result Value Ref Range   TSH 0.006 (L) 0.450 - 4.500 uIU/mL  T4, free     Status: None   Collection Time: 11/23/21  3:56 PM  Result Value Ref Range   Free T4 1.10 0.82 - 1.77 ng/dL  Vitamin B12     Status: None   Collection Time: 11/23/21  3:56 PM  Result Value Ref Range   Vitamin B-12 770 232 - 1,245 pg/mL     On 09/30/2016 she underwent thyroid uptake and scan which showed 26% (normal 10-30 percent) uniform uptake with no nodularity.  Thyroid uptake and scan on August 07, 2021: FINDINGS: Cold nodule inferior pole RIGHT lobe.   No additional warm or cold nodules identified.   4 hour I-123 uptake = 30.6% (normal 5-20%), 24 hour I-123 uptake = 47.1% (normal 10-30%)   IMPRESSION: Elevated 4 hour and 24 hour radio iodine uptakes consistent with hyperthyroidism.   Cold nodule at inferior pole RIGHT thyroid lobe; thyroid ultrasound evaluation recommended to exclude mass/malignancy.  He underwent fine-needle aspiration biopsy with benign outcomes.  Assessment & Plan:    1. Hyperthyroidism  2.  Left lobe cold nodule- -she is status post radioactive iodine thyroid ablation for hyperthyroidism with thyroid nodule.  Her treatment was administered on September 11, 2021.  She is improving clinically, her thyroid function tests are approaching normal.  She did not develop hypothyroidism yet, hence will not be initiated on thyroid hormone replacement. She will return in 5 weeks with repeat thyroid function tests. The biopsy results of her old left lobe nodule benign.   She is made aware of the fact that RAI induces hypothyroidism Which will need lifelong replacement of thyroid hormone.  3. Vitamin D deficiency She is now vitamin D replete  at  46 , she only need maintenance dose . She was previously found to have hypervitaminosis b12. Advised to hold.\  - I advised patient to maintain close follow up with Early, Coralee Pesa, NP for primary care needs.   I spent 21  minutes in the care of the patient today including review of labs from Thyroid Function, CMP, and other relevant labs ; imaging/biopsy records (current and previous including abstractions from other facilities); face-to-face time discussing  her lab results and symptoms, medications doses, her options of short and long term treatment based on the latest standards of care / guidelines;   and documenting the encounter.  Kimberly Ramsey  participated in the discussions, expressed understanding, and voiced agreement with the above plans.  All questions were answered to her satisfaction. she is encouraged to contact clinic should she have any questions or concerns prior to her return visit.    Follow up plan: Return in about 5 weeks (around 12/31/2021) for F/U with Pre-visit Labs.  Glade Lloyd, MD Phone: [phone]  Fax: [phone]   11/26/2021, 12:44 PM

## 2021-12-29 LAB — TSH: TSH: 14.4 u[IU]/mL — ABNORMAL HIGH (ref 0.450–4.500)

## 2021-12-29 LAB — T4, FREE: Free T4: 0.54 ng/dL — ABNORMAL LOW (ref 0.82–1.77)

## 2021-12-29 LAB — T3, FREE: T3, Free: 2.4 pg/mL (ref 2.0–4.4)

## 2021-12-30 ENCOUNTER — Encounter: Payer: Self-pay | Admitting: "Endocrinology

## 2021-12-30 ENCOUNTER — Ambulatory Visit (INDEPENDENT_AMBULATORY_CARE_PROVIDER_SITE_OTHER): Payer: 59 | Admitting: "Endocrinology

## 2021-12-30 VITALS — BP 122/86 | HR 72 | Ht 65.0 in | Wt 169.6 lb

## 2021-12-30 DIAGNOSIS — E559 Vitamin D deficiency, unspecified: Secondary | ICD-10-CM | POA: Diagnosis not present

## 2021-12-30 DIAGNOSIS — E041 Nontoxic single thyroid nodule: Secondary | ICD-10-CM | POA: Diagnosis not present

## 2021-12-30 DIAGNOSIS — E890 Postprocedural hypothyroidism: Secondary | ICD-10-CM

## 2021-12-30 MED ORDER — LEVOTHYROXINE SODIUM 75 MCG PO TABS: 75.0000 ug | ORAL_TABLET | Freq: Every day | ORAL | 1 refills | Status: DC

## 2021-12-30 NOTE — Progress Notes (Signed)
12/30/2021  Endocrinology follow-up note   Subjective:    Patient ID: Kimberly Ramsey, female    DOB: 26-Jun-1985, PCP Early, Coralee Pesa, NP   Past Medical History:  Diagnosis Date   Cleft lip and palate    Down syndrome in child of prior pregnancy, currently pregnant 01/16/2013   36yo daughter, echo normal, no complications   Encounter for IUD insertion 10/03/2013   Mirena 10/03/13           08/01/18 Removal, insertion Liletta   Rectal bleeding 06/29/2017   Thrombocytopenia complicating pregnancy ([place])    Thyroid disease    followed by Dr. Dorris Fetch   Past Surgical History:  Procedure Laterality Date   CLEFT PALATE REPAIR     COLONOSCOPY N/A 07/21/2017   TI normal, torturous colon, internal hemorrhoids s/p banding. External hemorrhoids. Repeat colonoscopy in 5 years.    HEMORRHOID BANDING N/A 07/21/2017   Procedure: Thayer Jew;  Surgeon: Danie Binder, MD;  [facility];  Service: Endoscopy;  Laterality: N/A;   HIP SURGERY     took bone from hip to fix cleft palate.   Social History   Socioeconomic History   Marital status: Single    Spouse name: Not on file   Number of children: Not on file   Years of education: Not on file   Highest education level: Not on file  Occupational History   Not on file  Tobacco Use   Smoking status: Never   Smokeless tobacco: Never  Vaping Use   Vaping Use: Never used  Substance and Sexual Activity   Alcohol use: No   Drug use: No   Sexual activity: Yes    Birth control/protection: I.U.D.  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   Social Determinants of Health   Financial Resource Strain: Low Risk  (05/08/2021)   Overall Financial Resource Strain (CARDIA)    Difficulty of Paying Living Expenses: Not very hard  Food Insecurity: No Food Insecurity (05/08/2021)   Hunger Vital Sign    Worried About Running Out  of Food in the Last Year: Never true    Ran Out of Food in the Last Year: Never true  Transportation Needs: No Transportation Needs (05/08/2021)   PRAPARE - Hydrologist (Medical): No    Lack of Transportation (Non-Medical): No  Physical Activity: Sufficiently Active (05/08/2021)   Exercise Vital Sign    Days of Exercise per Week: 5 days    Minutes of Exercise per Session: 60 min  Stress: No Stress Concern Present (05/08/2021)   [place]    Feeling of Stress : Not at all  Social Connections: Moderately Isolated (05/08/2021)   Social Connection and Isolation Panel [NHANES]    Frequency of Communication with Friends and Family: Three times a week    Frequency of Social Gatherings with Friends and Family: Three times a week    Attends Religious Services: 1 to 4 times per year    Active Member of Clubs or Organizations: No    Attends Archivist Meetings: Never    Marital Status: Never married   Outpatient Encounter Medications as of 12/30/2021  Medication Sig   levothyroxine (SYNTHROID) 75 MCG tablet Take 1 tablet (75 mcg total) by mouth daily before breakfast.   BIOTIN PO Take 1 capsule by mouth  daily.   Cholecalciferol (VITAMIN D3 PO) Take 1 tablet by mouth daily in the afternoon.   Hydrocortisone 2.5 % KIT Apply rectally BID PRN   levonorgestrel (LILETTA, 52 MG,) 19.5 MCG/DAY IUD IUD 1 each by Intrauterine route once.   Multiple Vitamins-Minerals (MULTIVITAMIN WOMEN PO) Take 1 tablet by mouth daily in the afternoon.   No facility-administered encounter medications on file as of 12/30/2021.   ALLERGIES: Allergies  Allergen Reactions   Codeine Nausea And Vomiting    VACCINATION STATUS: Immunization History  Administered Date(s) Administered   PPD Test 01/05/2017   Tdap 08/18/2013    HPI 36 year old female patient with medical history as above. She is being seen in  follow-up with repeat thyroid function tests after she was seen in consultation for hyperthyroidism secondary to [place]' disease.  She is status post radioactive iodine ablation after a negative biopsy for cold nodule.  She feels better, her previsit labs are now consistent with treatment effect with hypothyroidism.    - She denies palpitations, heat intolerance, tremors.  She has a steady weight since last visit.    She has normal menstrual cycles, mother of 3 children.  She denies any family history of thyroid dysfunction. - She denies dysphagia, shortness of breath, voice change. She is on vitamin D supplement started due to vitamin D deficiency.  Review of Systems  Review of systems  Constitutional: + Minimally fluctuating body weight,  current  Body mass index is 28.22 kg/m. , no fatigue, no subjective hyperthermia, no subjective hypothermia   Objective:    BP 122/86   Pulse 72   Ht 5' 5" (1.651 m)   Wt 169 lb 9.6 oz (76.9 kg)   BMI 28.22 kg/m   Wt Readings from Last 3 Encounters:  12/30/21 169 lb 9.6 oz (76.9 kg)  11/26/21 172 lb 6.4 oz (78.2 kg)  10/22/21 170 lb (77.1 kg)    Physical Exam  Constitutional: + Overweight for height, not in acute distress, normal state of mind.    Eyes: PERRLA, EOMI, no exophthalmos ENT:  + Repaired cleft lip,  moist mucous membranes, + no gross thyromegaly,  no cervical lymphadenopathy.     Recent Results (from the past 2160 hour(s))  TSH     Status: Abnormal   Collection Time: 10/20/21 11:18 AM  Result Value Ref Range   TSH 0.005 (L) 0.450 - 4.500 uIU/mL  T4, free     Status: None   Collection Time: 10/20/21 11:18 AM  Result Value Ref Range   Free T4 1.40 0.82 - 1.77 ng/dL  T3, free     Status: None   Collection Time: 10/20/21 11:18 AM  Result Value Ref Range   T3, Free 4.0 2.0 - 4.4 pg/mL  TSH     Status: Abnormal   Collection Time: 11/23/21  3:56 PM  Result Value Ref Range   TSH 0.006 (L) 0.450 - 4.500 uIU/mL  T4, free      Status: None   Collection Time: 11/23/21  3:56 PM  Result Value Ref Range   Free T4 1.10 0.82 - 1.77 ng/dL  Vitamin B12     Status: None   Collection Time: 11/23/21  3:56 PM  Result Value Ref Range   Vitamin B-12 770 232 - 1,245 pg/mL  TSH     Status: Abnormal   Collection Time: 12/28/21  8:31 AM  Result Value Ref Range   TSH 14.400 (H) 0.450 - 4.500 uIU/mL  T4, free  Status: Abnormal   Collection Time: 12/28/21  8:31 AM  Result Value Ref Range   Free T4 0.54 (L) 0.82 - 1.77 ng/dL  T3, free     Status: None   Collection Time: 12/28/21  8:31 AM  Result Value Ref Range   T3, Free 2.4 2.0 - 4.4 pg/mL     On 09/30/2016 she underwent thyroid uptake and scan which showed 26% (normal 10-30 percent) uniform uptake with no nodularity.  Thyroid uptake and scan on August 07, 2021: FINDINGS: Cold nodule inferior pole RIGHT lobe.   No additional warm or cold nodules identified.   4 hour I-123 uptake = 30.6% (normal 5-20%), 24 hour I-123 uptake = 47.1% (normal 10-30%)   IMPRESSION: Elevated 4 hour and 24 hour radio iodine uptakes consistent with hyperthyroidism.   Cold nodule at inferior pole RIGHT thyroid lobe; thyroid ultrasound evaluation recommended to exclude mass/malignancy.  He underwent fine-needle aspiration biopsy with benign outcomes.  Assessment & Plan:    1.  RAI induced hypothyroidism  2.  Left lobe cold nodule- -she is status post radioactive iodine thyroid ablation for hyperthyroidism with thyroid nodule.  Her treatment was administered on September 11, 2021.  She presents with thyroid function test consistent with treatment effect with hypothyroidism.  I discussed and initiated levothyroxine treatment which I cannot she will start with milligrams and 75 mcg p.o. daily before breakfast.  - We discussed about the correct intake of her thyroid hormone, on empty stomach at fasting, with water, separated by at least 30 minutes from breakfast and other medications,  and  separated by more than 4 hours from calcium, iron, multivitamins, acid reflux medications (PPIs). -Patient is made aware of the fact that thyroid hormone replacement is needed for life, dose to be adjusted by periodic monitoring of thyroid function tests.  3. Vitamin D deficiency She is now vitamin D replete at  41 , she only need maintenance dose . She was previously found to have hypervitaminosis b12. Advised to hold.\  - I advised patient to maintain close follow up with Early, Coralee Pesa, NP for primary care needs.   I spent 21 minutes in the care of the patient today including review of labs from Thyroid Function, CMP, and other relevant labs ; imaging/biopsy records (current and previous including abstractions from other facilities); face-to-face time discussing  her lab results and symptoms, medications doses, her options of short and long term treatment based on the latest standards of care / guidelines;   and documenting the encounter.  Kimberly Ramsey  participated in the discussions, expressed understanding, and voiced agreement with the above plans.  All questions were answered to her satisfaction. she is encouraged to contact clinic should she have any questions or concerns prior to her return visit.    Follow up plan: Return in about 3 months (around 04/01/2022) for F/U with Pre-visit Labs.  Glade Lloyd, MD Phone: [phone]  Fax: [phone]   12/30/2021, 1:37 PM

## 2022-03-25 LAB — T4, FREE: Free T4: 1.58 ng/dL (ref 0.82–1.77)

## 2022-03-25 LAB — TSH: TSH: 0.05 u[IU]/mL — ABNORMAL LOW (ref 0.450–4.500)

## 2022-04-01 ENCOUNTER — Ambulatory Visit: Payer: 59 | Admitting: "Endocrinology

## 2022-04-01 ENCOUNTER — Encounter: Payer: Self-pay | Admitting: "Endocrinology

## 2022-04-01 VITALS — BP 112/82 | HR 76 | Ht 65.0 in | Wt 168.0 lb

## 2022-04-01 DIAGNOSIS — E890 Postprocedural hypothyroidism: Secondary | ICD-10-CM

## 2022-04-01 DIAGNOSIS — E559 Vitamin D deficiency, unspecified: Secondary | ICD-10-CM

## 2022-04-01 MED ORDER — LEVOTHYROXINE SODIUM 75 MCG PO TABS
75.0000 ug | ORAL_TABLET | Freq: Every day | ORAL | 1 refills | Status: DC
Start: 2022-04-01 — End: 2022-09-30

## 2022-04-01 NOTE — Progress Notes (Signed)
04/01/2022  Endocrinology follow-up note   Subjective:    Patient ID: Kimberly Ramsey, female    DOB: Feb 03, 1986, PCP Early, Coralee Pesa, NP   Past Medical History:  Diagnosis Date   Cleft lip and palate    Down syndrome in child of prior pregnancy, currently pregnant 01/16/2013   37yo daughter, echo normal, no complications   Encounter for IUD insertion 10/03/2013   Mirena 10/03/13           08/01/18 Removal, insertion Liletta   Rectal bleeding 06/29/2017   Thrombocytopenia complicating pregnancy ([place])    Thyroid disease    followed by Dr. Dorris Fetch   Past Surgical History:  Procedure Laterality Date   CLEFT PALATE REPAIR     COLONOSCOPY N/A 07/21/2017   TI normal, torturous colon, internal hemorrhoids s/p banding. External hemorrhoids. Repeat colonoscopy in 5 years.    HEMORRHOID BANDING N/A 07/21/2017   Procedure: Thayer Jew;  Surgeon: Danie Binder, MD;  [facility];  Service: Endoscopy;  Laterality: N/A;   HIP SURGERY     took bone from hip to fix cleft palate.   Social History   Socioeconomic History   Marital status: Single    Spouse name: Not on file   Number of children: Not on file   Years of education: Not on file   Highest education level: Not on file  Occupational History   Not on file  Tobacco Use   Smoking status: Never   Smokeless tobacco: Never  Vaping Use   Vaping Use: Never used  Substance and Sexual Activity   Alcohol use: No   Drug use: No   Sexual activity: Yes    Birth control/protection: I.U.D.  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   Social Determinants of Health   Financial Resource Strain: Low Risk  (05/08/2021)   Overall Financial Resource Strain (CARDIA)    Difficulty of Paying Living Expenses: Not very hard  Food Insecurity: No Food Insecurity (05/08/2021)   Hunger Vital Sign    Worried About Running Out  of Food in the Last Year: Never true    Ran Out of Food in the Last Year: Never true  Transportation Needs: No Transportation Needs (05/08/2021)   PRAPARE - Hydrologist (Medical): No    Lack of Transportation (Non-Medical): No  Physical Activity: Sufficiently Active (05/08/2021)   Exercise Vital Sign    Days of Exercise per Week: 5 days    Minutes of Exercise per Session: 60 min  Stress: No Stress Concern Present (05/08/2021)   [place]    Feeling of Stress : Not at all  Social Connections: Moderately Isolated (05/08/2021)   Social Connection and Isolation Panel [NHANES]    Frequency of Communication with Friends and Family: Three times a week    Frequency of Social Gatherings with Friends and Family: Three times a week    Attends Religious Services: 1 to 4 times per year    Active Member of Clubs or Organizations: No    Attends Archivist Meetings: Never    Marital Status: Never married   Outpatient Encounter Medications as of 04/01/2022  Medication Sig   BIOTIN PO Take 1 capsule by mouth daily.   Cholecalciferol (VITAMIN D3 PO) Take 1 tablet by mouth daily in the afternoon.  Hydrocortisone 2.5 % KIT Apply rectally BID PRN   levonorgestrel (LILETTA, 52 MG,) 19.5 MCG/DAY IUD IUD 1 each by Intrauterine route once.   levothyroxine (SYNTHROID) 75 MCG tablet Take 1 tablet (75 mcg total) by mouth daily before breakfast.   Multiple Vitamins-Minerals (MULTIVITAMIN WOMEN PO) Take 1 tablet by mouth daily in the afternoon.   [DISCONTINUED] levothyroxine (SYNTHROID) 75 MCG tablet Take 1 tablet (75 mcg total) by mouth daily before breakfast.   No facility-administered encounter medications on file as of 04/01/2022.   ALLERGIES: Allergies  Allergen Reactions   Codeine Nausea And Vomiting    VACCINATION STATUS: Immunization History  Administered Date(s) Administered   PPD Test 01/05/2017    Tdap 08/18/2013    HPI 37 year old female patient with medical history as above. She is being seen in follow-up with repeat thyroid function tests after she was recently started on levothyroxine for RAI induced hypothyroidism.  She was given RAI treatment to treat hyperthyroidism due to [place]' disease.    She reports feeling better, has no new complaints today.   - She denies palpitations, heat intolerance, tremors.  She has a steady weight since last visit.    She has normal menstrual cycles, mother of 3 children.  She denies any family history of thyroid dysfunction. - She denies dysphagia, shortness of breath, voice change. She is on vitamin D supplement started due to vitamin D deficiency.  Review of Systems  Review of systems  Constitutional: + Minimally fluctuating body weight,  current  Body mass index is 27.96 kg/m. , no fatigue, no subjective hyperthermia, no subjective hypothermia   Objective:    BP 112/82   Pulse 76   Ht 5' 5"$  (1.651 m)   Wt 168 lb (76.2 kg)   BMI 27.96 kg/m   Wt Readings from Last 3 Encounters:  04/01/22 168 lb (76.2 kg)  12/30/21 169 lb 9.6 oz (76.9 kg)  11/26/21 172 lb 6.4 oz (78.2 kg)    Physical Exam  Constitutional: + Overweight for height, not in acute distress, normal state of mind.    Eyes: PERRLA, EOMI, no exophthalmos ENT:  + Repaired cleft lip,  moist mucous membranes, + no gross thyromegaly,  no cervical lymphadenopathy.     Recent Results (from the past 2160 hour(s))  TSH     Status: Abnormal   Collection Time: 03/24/22 12:05 PM  Result Value Ref Range   TSH 0.050 (L) 0.450 - 4.500 uIU/mL  T4, free     Status: None   Collection Time: 03/24/22 12:05 PM  Result Value Ref Range   Free T4 1.58 0.82 - 1.77 ng/dL     On 09/30/2016 she underwent thyroid uptake and scan which showed 26% (normal 10-30 percent) uniform uptake with no nodularity.  Thyroid uptake and scan on August 07, 2021: FINDINGS: Cold nodule inferior pole  RIGHT lobe.   No additional warm or cold nodules identified.   4 hour I-123 uptake = 30.6% (normal 5-20%), 24 hour I-123 uptake = 47.1% (normal 10-30%)   IMPRESSION: Elevated 4 hour and 24 hour radio iodine uptakes consistent with hyperthyroidism.   Cold nodule at inferior pole RIGHT thyroid lobe; thyroid ultrasound evaluation recommended to exclude mass/malignancy.  He underwent fine-needle aspiration biopsy with benign outcomes.  Assessment & Plan:    1.  RAI induced hypothyroidism  2.  Left lobe cold nodule- -she is status post radioactive iodine thyroid ablation for hyperthyroidism with thyroid nodule.  Her treatment was administered on September 11, 2021.  She was recently started on levothyroxine for RAI induced hypothyroidism.  Her previsit thyroid function tests are consistent with appropriate replacement. She is advised to continue levothyroxine 75 mcg p.o. daily before breakfast.  - We discussed about the correct intake of her thyroid hormone, on empty stomach at fasting, with water, separated by at least 30 minutes from breakfast and other medications,  and separated by more than 4 hours from calcium, iron, multivitamins, acid reflux medications (PPIs). -Patient is made aware of the fact that thyroid hormone replacement is needed for life, dose to be adjusted by periodic monitoring of thyroid function tests.  3. Vitamin D deficiency She is now vitamin D replete at  51 , she only need maintenance dose . She was previously found to have hypervitaminosis b12. Advised to hold.  - I advised patient to maintain close follow up with Early, Coralee Pesa, NP for primary care needs.  I spent  21  minutes in the care of the patient today including review of labs from Thyroid Function, CMP, and other relevant labs ; imaging/biopsy records (current and previous including abstractions from other facilities); face-to-face time discussing  her lab results and symptoms, medications doses, her options of  short and long term treatment based on the latest standards of care / guidelines;   and documenting the encounter.  Kimberly Ramsey  participated in the discussions, expressed understanding, and voiced agreement with the above plans.  All questions were answered to her satisfaction. she is encouraged to contact clinic should she have any questions or concerns prior to her return visit.   Follow up plan: Return in about 6 months (around 09/30/2022) for F/U with Pre-visit Labs.  Glade Lloyd, MD Phone: [phone]  Fax: [phone]   04/01/2022, 10:15 AM

## 2022-05-17 ENCOUNTER — Ambulatory Visit (INDEPENDENT_AMBULATORY_CARE_PROVIDER_SITE_OTHER): Payer: 59 | Admitting: Women's Health

## 2022-05-17 ENCOUNTER — Other Ambulatory Visit (HOSPITAL_COMMUNITY)
Admission: RE | Admit: 2022-05-17 | Discharge: 2022-05-17 | Disposition: A | Discharge status: Home / Self Care | Payer: 59 | Source: Ambulatory Visit | Attending: Women's Health | Admitting: Women's Health

## 2022-05-17 ENCOUNTER — Encounter: Payer: Self-pay | Admitting: Women's Health

## 2022-05-17 VITALS — BP 123/83 | HR 74 | Ht 65.0 in | Wt 168.2 lb

## 2022-05-17 DIAGNOSIS — Z30432 Encounter for removal of intrauterine contraceptive device: Secondary | ICD-10-CM | POA: Diagnosis not present

## 2022-05-17 DIAGNOSIS — Z113 Encounter for screening for infections with a predominantly sexual mode of transmission: Secondary | ICD-10-CM

## 2022-05-17 DIAGNOSIS — N926 Irregular menstruation, unspecified: Secondary | ICD-10-CM | POA: Insufficient documentation

## 2022-05-17 DIAGNOSIS — R1900 Intra-abdominal and pelvic swelling, mass and lump, unspecified site: Secondary | ICD-10-CM | POA: Diagnosis not present

## 2022-05-17 DIAGNOSIS — Z01419 Encounter for gynecological examination (general) (routine) without abnormal findings: Secondary | ICD-10-CM | POA: Diagnosis not present

## 2022-05-17 NOTE — Progress Notes (Signed)
WELL-WOMAN EXAMINATION Patient name: Kimberly Ramsey MRN [id number]  Date of birth: 1985-10-29 Chief Complaint:   Gynecologic Exam (And IUD Removal, wants swab for std)  History of Present Illness:   Kimberly Ramsey is a 37 y.o. [id number] African-American female being seen today for a routine well-woman exam and IUD removal.  Current complaints: irregular periods x , bleeds on and off all the time. Denies abnormal discharge, itching/odor/irritation.  No new sex partners. Nt currently sexually active. Does request STD screening.  Lump in abdomen for past few months, more noticeable last month, sometimes hurts.  Also wants removal of her Liletta IUD. Her IUD was placed 08/01/18.  She desires removal because she is having some dizziness and irregular periods, wants to take it out and see if symptoms resolve. Signed copy of informed consent in chart.  PCP: none currently-list given     Hypothyroidism managed by Dr. Fransico Him Patient's last menstrual period was 05/14/2022. The current method of family planning is abstinence and IUD.  Last pap 05/08/21. Results were: NILM w/ HRHPV negative. H/O abnormal pap: no Last mammogram: never, had breast u/s 2017 d/t mass-normal. Family h/o breast cancer: no Last colonoscopy: 2019 d/t hemorrhoids. Results were: internal hemorrhoids w/ banding Family h/o colorectal cancer: yes father dx in 33s     05/17/2022    8:47 AM 05/08/2021   10:52 AM 04/28/2021    2:15 PM 08/07/2020   10:46 AM 03/26/2020    9:32 AM  Depression screen PHQ 2/9  Decreased Interest 0 0 0 0 0  Down, Depressed, Hopeless 0 0 0 0 0  PHQ - 2 Score 0 0 0 0 0  Altered sleeping 1 0  0   Tired, decreased energy 1 0  0   Change in appetite 0 0  0   Feeling bad or failure about yourself  0 0  0   Trouble concentrating 0 0  0   Moving slowly or fidgety/restless 0 0  0   Suicidal thoughts 0 0  0   PHQ-9 Score 2 0  0   Difficult doing work/chores    Not difficult at all         05/17/2022     8:47 AM 05/08/2021   10:52 AM 03/26/2020    9:33 AM  GAD 7 : Generalized Anxiety Score  Nervous, Anxious, on Edge 0 0 0  Control/stop worrying 0 0 0  Worry too much - different things 0 0 0  Trouble relaxing 0 0 0  Restless 0 0 0  Easily annoyed or irritable 0 0 0  Afraid - awful might happen 0 0 0  Total GAD 7 Score 0 0 0  Anxiety Difficulty   Not difficult at all     Review of Systems:   Pertinent items are noted in HPI Denies any headaches, blurred vision, fatigue, shortness of breath, chest pain, abdominal pain, abnormal vaginal discharge/itching/odor/irritation, problems with periods, bowel movements, urination, or intercourse unless otherwise stated above. Pertinent History Reviewed:  Reviewed past medical,surgical, social and family history.  Reviewed problem list, medications and allergies. Physical Assessment:   Vitals:   05/17/22 0843  BP: 123/83  Pulse: 74  Weight: 168 lb 3.2 oz (76.3 kg)  Height: 5\' 5"  (1.651 m)  Body mass index is 27.99 kg/m.        Physical Examination:   General appearance - well appearing, and in no distress  Mental status - alert, oriented to person, place, and time  Psych:  She has a normal mood and affect  Skin - warm and dry, normal color, no suspicious lesions noted  Chest - effort normal, all lung fields clear to auscultation bilaterally  Heart - normal rate and regular rhythm  Neck:  midline trachea, no thyromegaly or nodules  Breasts - breasts appear normal, no suspicious masses, no skin or nipple changes or  axillary nodes  Abdomen - soft, nontender, nondistended, organomegaly. ~3cm round mobile soft mass above umbilicus, doesn't feel like hernia, more like possible lipoma?  Pelvic - VULVA: normal appearing vulva with no masses, tenderness or lesions  VAGINA: normal appearing vagina with normal color and discharge, no lesions  CERVIX: normal appearing cervix without discharge or lesions, no CMT  Thin prep pap is not done   UTERUS:  uterus is felt to be normal size, shape, consistency and nontender   ADNEXA: No adnexal masses or tenderness noted.  Extremities:  No swelling or varicosities noted  IUD REMOVAL Time out was performed.  A Duplechain speculum was placed in the vagina.  The cervix was visualized, and the strings were visible. They were grasped and the Liletta  IUD was easily removed intact without complications. The patient tolerated the procedure well.   Chaperone: Faith Rogue   Assessment & Plan:  1) Well-Woman Exam  2) IUD removal> irregular periods, dizziness, wants to see if sx resolve. Not currently sexually active  3) Irregular periods> CV swab sent  4) STD screen  5) Abdominal mass ? lipoma> will refer to gen surgery, lives in [place], note routed to [place] to see how we enter referral to [facility]  Labs/procedures today: CV swab, IUD removal  Mammogram: @ 35yo, or sooner if problems Colonoscopy: @ 35yo d/t family history, or sooner if problems  No orders of the defined types were placed in this encounter.   Meds: No orders of the defined types were placed in this encounter.   Follow-up: Return in about 1 year (around 05/17/2023) for Physical.  Cheral Marker CNM, [facility] 05/17/2022 9:18 AM

## 2022-05-17 NOTE — Patient Instructions (Signed)
Primary Care Providers Dr. Zack Hall ([place]) [phone] [facility] [phone] [facility] ([place]) [phone] [facility] ([place]) [phone] [facility] ([place]) [phone] [facility] ([place]) [phone] [facility] [place] [phone] [facility] [phone]  

## 2022-05-18 ENCOUNTER — Encounter: Payer: Self-pay | Admitting: Women's Health

## 2022-05-18 LAB — CERVICOVAGINAL ANCILLARY ONLY
Bacterial Vaginitis (gardnerella): POSITIVE — AB
Candida Glabrata: NEGATIVE
Candida Vaginitis: NEGATIVE
Chlamydia: NEGATIVE
Comment: NEGATIVE
Comment: NEGATIVE
Comment: NEGATIVE
Comment: NEGATIVE
Comment: NEGATIVE
Comment: NORMAL
Neisseria Gonorrhea: NEGATIVE
Trichomonas: NEGATIVE

## 2022-05-18 MED ORDER — METRONIDAZOLE 500 MG PO TABS: 500.0000 mg | ORAL_TABLET | Freq: Two times a day (BID) | ORAL | 0 refills | Status: DC

## 2022-05-18 NOTE — Addendum Note (Signed)
Addended by: Cheral Marker on: 05/18/2022 01:23 PM   Modules accepted: Orders

## 2022-05-24 ENCOUNTER — Encounter: Payer: Self-pay | Admitting: Women's Health

## 2022-06-22 ENCOUNTER — Encounter: Payer: Self-pay | Admitting: *Deleted

## 2022-08-03 ENCOUNTER — Ambulatory Visit: Payer: 59 | Admitting: Gastroenterology

## 2022-08-03 ENCOUNTER — Telehealth: Payer: Self-pay | Admitting: *Deleted

## 2022-08-03 ENCOUNTER — Encounter: Payer: Self-pay | Admitting: Gastroenterology

## 2022-08-03 VITALS — BP 124/83 | HR 78 | Temp 97.8°F | Ht 65.0 in | Wt 168.4 lb

## 2022-08-03 DIAGNOSIS — K5900 Constipation, unspecified: Secondary | ICD-10-CM

## 2022-08-03 DIAGNOSIS — Z800 Family history of malignant neoplasm of digestive organs: Secondary | ICD-10-CM

## 2022-08-03 NOTE — Patient Instructions (Signed)
I would like for you to start Linzess 145 microgram capsule. Linzess works best when taken once a day every day, on an empty stomach, at least 30 minutes before your first meal of the day.  When Linzess is taken daily as directed:  *Constipation relief is typically felt in about a week *IBS-C patients may begin to experience relief from belly pain and overall abdominal symptoms (pain, discomfort, and bloating) in about 1 week,   with symptoms typically improving over 12 weeks.  Diarrhea may occur in the first 2 weeks -keep taking it.  The diarrhea should go away and you should start having normal, complete, full bowel movements. It may be helpful to start treatment when you can be near the comfort of your own bathroom, such as a weekend.    Let me know how it goes! You can message on MyChart. When we get the right dose, I will send to the pharmacy.  We are arranging a colonoscopy with Dr. Marletta Lor.  I will see you in 3 months!  I enjoyed seeing you again today! I value our relationship and want to provide genuine, compassionate, and quality care. You may receive a survey regarding your visit with me, and I welcome your feedback! Thanks so much for taking the time to complete this. I look forward to seeing you again.      Gelene Mink, PhD, ANP-BC [facility]

## 2022-08-03 NOTE — Telephone Encounter (Signed)
LMOVM to call back to schedule TCS with Dr. Carver,  ASA2 

## 2022-08-03 NOTE — Progress Notes (Signed)
Gastroenterology Office Note     Primary Care Physician:  Early, Sung Amabile, NP  Primary GI: Dr. Marletta Lor    Chief Complaint   Chief Complaint  Patient presents with   New Patient (Initial Visit)    Pt complains of constipation and she has a family history of Cancer. Colonoscopy visit     History of Present Illness   Kimberly Ramsey is a 37 y.o. female presenting today to arrange high risk screening colonoscopy. She has a FH of colon cancer in father at age 5. Last colonoscopy was in 2019 with hemorrhoids, s/p banding. 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    Intermittent constipation. Occasional hemorrhoid flares. Tries to eat as much fiber as she can. Benefiber without help. Takes antacid for gas occasionally. No dysphagia. Tries to drink plenty of water. BM about 3 times a week. Feels unproductive at times. Tries not to strain. Has had abdominal pain one time; otherwise, no abdominal pain or bloating.   She has been seen by Dr. Fredricka Bonine, general surgeon at [facility] due to abdominal wall mass felt to likely be lipoma vs hernia. She will have surgery in the future once things calm down.     Colonoscopy 2019: TI normal, internal hemorrhoids s/p banding, external hemorrhoids, repeat in 5 years.   Past Medical History:  Diagnosis Date   Cleft lip and palate    Down syndrome in child of prior pregnancy, currently pregnant 01/16/2013   8yo daughter, echo normal, no complications   Encounter for IUD insertion 10/03/2013   Mirena 10/03/13           08/01/18 Removal, insertion Liletta   Rectal bleeding 06/29/2017   Thrombocytopenia complicating pregnancy (HCC)    Thyroid disease    followed by Dr. Fransico Him    Past Surgical History:  Procedure Laterality Date   CLEFT PALATE REPAIR     COLONOSCOPY N/A 07/21/2017   TI normal, torturous colon, internal hemorrhoids s/p banding. External hemorrhoids. Repeat colonoscopy in 5 years.    HEMORRHOID BANDING N/A 07/21/2017    Procedure: [place];  Surgeon: West Bali, MD;  [facility];  Service: Endoscopy;  Laterality: N/A;   HIP SURGERY     took bone from hip to fix cleft palate.    Current Outpatient Medications  Medication Sig Dispense Refill   levothyroxine (SYNTHROID) 75 MCG tablet Take 1 tablet (75 mcg total) by mouth daily before breakfast. 90 tablet 1   Multiple Vitamins-Minerals (MULTIVITAMIN WOMEN PO) Take 1 tablet by mouth daily in the afternoon.     No current facility-administered medications for this visit.    Allergies as of 08/03/2022 - Review Complete 08/03/2022  Allergen Reaction Noted   Codeine Nausea And Vomiting 12/27/2012    Family History  Problem Relation Age of Onset   Mental illness Mother    Schizophrenia Mother    Lupus Father    Colon cancer Father 26       surgery/chemo   Colon polyps Neg Hx     Social History   Socioeconomic History   Marital status: Single    Spouse name: Not on file   Number of children: Not on file   Years of education: Not on file   Highest education level: Not on file  Occupational History   Not on file  Tobacco Use   Smoking status: Never   Smokeless tobacco: Never  Vaping Use   Vaping Use: Never used  Substance and Sexual Activity   Alcohol use: No   Drug use: No   Sexual activity: Yes    Birth control/protection: I.U.D.  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   Social Determinants of Health   Financial Resource Strain: Low Risk  (05/08/2021)   Overall Financial Resource Strain (CARDIA)    Difficulty of Paying Living Expenses: Not very hard  Food Insecurity: No Food Insecurity (05/08/2021)   Hunger Vital Sign    Worried About Running Out of Food in the Last Year: Never true    Ran Out of Food in the Last Year: Never true  Transportation Needs: No Transportation Needs (05/08/2021)    PRAPARE - Administrator, Civil Service (Medical): No    Lack of Transportation (Non-Medical): No  Physical Activity: Sufficiently Active (05/08/2021)   Exercise Vital Sign    Days of Exercise per Week: 5 days    Minutes of Exercise per Session: 60 min  Stress: No Stress Concern Present (05/08/2021)   [employer] of Occupational Health - Occupational Stress Questionnaire    Feeling of Stress : Not at all  Social Connections: Moderately Isolated (05/08/2021)   Social Connection and Isolation Panel [NHANES]    Frequency of Communication with Friends and Family: Three times a week    Frequency of Social Gatherings with Friends and Family: Three times a week    Attends Religious Services: 1 to 4 times per year    Active Member of Clubs or Organizations: No    Attends Banker Meetings: Never    Marital Status: Never married  Intimate Partner Violence: Not At Risk (05/08/2021)   Humiliation, Afraid, Rape, and Kick questionnaire    Fear of Current or Ex-Partner: No    Emotionally Abused: No    Physically Abused: No    Sexually Abused: No     Review of Systems   Gen: Denies any fever, chills, fatigue, weight loss, lack of appetite.  CV: Denies chest pain, heart palpitations, peripheral edema, syncope.  Resp: Denies shortness of breath at rest or with exertion. Denies wheezing or cough.  GI: Denies dysphagia or odynophagia. Denies jaundice, hematemesis, fecal incontinence. GU : Denies urinary burning, urinary frequency, urinary hesitancy MS: Denies joint pain, muscle weakness, cramps, or limitation of movement.  Derm: Denies rash, itching, dry skin Psych: Denies depression, anxiety, memory loss, and confusion Heme: Denies bruising, bleeding, and enlarged lymph nodes.   Physical Exam   BP 124/83   Pulse 78   Temp 97.8 F (36.6 C)   Ht 5\' 5"  (1.651 m)   Wt 168 lb 6.4 oz (76.4 kg)   LMP 07/26/2022   BMI 28.02 kg/m  General:   Alert and oriented.  Pleasant and cooperative. Well-nourished and well-developed.  Head:  Normocephalic and atraumatic. Eyes:  Without icterus Ears:  Normal auditory acuity. Lungs:  Clear to auscultation bilaterally.  Heart:  S1, S2 present without murmurs appreciated.  Abdomen:  +BS, soft, non-tender and non-distended. No HSM noted. No guarding or rebound. Small, mobile mass a few cm superior to umbilicus.  Rectal:  Deferred  Msk:  Symmetrical without gross deformities. Normal posture. Extremities:  Without edema. Neurologic:  Alert and  oriented x4;  grossly normal neurologically. Skin:  Intact without significant lesions or rashes. Psych:  Alert and cooperative. Normal mood and affect.   Assessment   Kimberly Ramsey  is a 37 y.o. female presenting today with need for high risk screening colonoscopy due to FH colon cancer in father at age 30, now reporting constipation.  Constipation: has progressed over the years. No improvement with supplemental fiber. Endorses high fiber diet and increased water intake. She is quite active. No alarm sign/symptoms. Will start Linzess 145 mcg daily and titrate as needed.  Last colonoscopy 2019 with hemorrhoids s/p banding. She has no other concerning lower or upper GI signs/symptoms. Colonoscopy to be arranged.    PLAN   Proceed with colonoscopy by Dr. Marletta Lor  in near future: the risks, benefits, and alternatives have been discussed with the patient in detail. The patient states understanding and desires to proceed.   Linzess 145 mcg samples. Message with update. Will titrate as needed.  3 month follow-up  May need banding in future. Will address constipation first.   Gelene Mink, PhD, ANP-BC [facility]

## 2022-08-04 MED ORDER — PEG 3350-KCL-NA BICARB-NACL 420 G PO SOLR: 4000.0000 mL | Freq: Once | ORAL | 0 refills | Status: DC

## 2022-08-04 NOTE — Addendum Note (Signed)
Addended by: Armstead Peaks on: 08/04/2022 09:07 AM   Modules accepted: Orders

## 2022-08-04 NOTE — Telephone Encounter (Addendum)
Pt returned call. She has been scheduled for 7/30 at 12:30pm. Aware will send instructions via mychart. Rx for prep sent to pharmacy. Also aware needs urine preg prior.  Per [facility] website PA for TCS "This member's plan does not currently require notification or prior-authorization through the [employer] Notification or Prior-Authorization Program. Please contact a Customer Care Professional at [phone] if you believe the information returned to be in error."

## 2022-08-24 ENCOUNTER — Other Ambulatory Visit: Payer: 59

## 2022-08-24 ENCOUNTER — Other Ambulatory Visit (HOSPITAL_COMMUNITY)
Admission: RE | Admit: 2022-08-24 | Discharge: 2022-08-24 | Disposition: A | Discharge status: Home / Self Care | Payer: 59 | Source: Ambulatory Visit | Attending: Obstetrics & Gynecology | Admitting: Obstetrics & Gynecology

## 2022-08-24 DIAGNOSIS — N898 Other specified noninflammatory disorders of vagina: Secondary | ICD-10-CM | POA: Insufficient documentation

## 2022-08-24 DIAGNOSIS — N930 Postcoital and contact bleeding: Secondary | ICD-10-CM | POA: Diagnosis present

## 2022-08-24 NOTE — Progress Notes (Signed)
   NURSE VISIT- VAGINITIS/STD/POC  SUBJECTIVE:  Kimberly Ramsey is a 37 y.o. [id number] GYN patientfemale here for a vaginal swab for vaginitis screening.  She reports the following symptoms: post coital bleeding, vulvar itching, and vaginal soreness  for 3 days. Denies abnormal vaginal bleeding, significant pelvic pain, fever, or UTI symptoms.  OBJECTIVE:  LMP 07/26/2022   Appears well, in no apparent distress  ASSESSMENT: Vaginal swab for vaginitis screening  PLAN: Self-collected vaginal probe for Gonorrhea, Chlamydia, Trichomonas, Bacterial Vaginosis, Yeast sent to lab Treatment: to be determined once results are received Follow-up as needed if symptoms persist/worsen, or new symptoms develop  Jobe Marker  08/24/2022 11:00 AM

## 2022-08-25 LAB — CERVICOVAGINAL ANCILLARY ONLY
Bacterial Vaginitis (gardnerella): NEGATIVE
Candida Glabrata: POSITIVE — AB
Candida Vaginitis: POSITIVE — AB
Chlamydia: NEGATIVE
Comment: NEGATIVE
Comment: NEGATIVE
Comment: NEGATIVE
Comment: NEGATIVE
Comment: NEGATIVE
Comment: NORMAL
Neisseria Gonorrhea: NEGATIVE
Trichomonas: NEGATIVE

## 2022-08-26 ENCOUNTER — Other Ambulatory Visit: Payer: Self-pay | Admitting: Adult Health

## 2022-08-26 MED ORDER — FLUCONAZOLE 100 MG PO TABS: 100.0000 mg | ORAL_TABLET | Freq: Every day | ORAL | 0 refills | Status: AC

## 2022-08-26 NOTE — Progress Notes (Signed)
+  yeast on vaginal swab will rx diflucan  

## 2022-09-02 ENCOUNTER — Other Ambulatory Visit: Payer: Self-pay | Admitting: *Deleted

## 2022-09-02 ENCOUNTER — Telehealth: Payer: Self-pay | Admitting: *Deleted

## 2022-09-02 MED ORDER — PEG 3350-KCL-NA BICARB-NACL 420 G PO SOLR: 4000.0000 mL | Freq: Once | ORAL | 0 refills | Status: AC

## 2022-09-02 NOTE — Telephone Encounter (Signed)
Pt left vm stating that prep hadn't been sent in.  Advised pt that prep was sent in on 08/04/22 to [facility]. Pt stated that she was told they didn't have a prescription . Advised pt that I will resubmit the prescription. Verbalized understanding

## 2022-09-03 ENCOUNTER — Other Ambulatory Visit (HOSPITAL_COMMUNITY)
Admission: RE | Admit: 2022-09-03 | Discharge: 2022-09-03 | Disposition: A | Discharge status: Home / Self Care | Payer: 59 | Source: Ambulatory Visit | Attending: Internal Medicine | Admitting: Internal Medicine

## 2022-09-03 DIAGNOSIS — Z800 Family history of malignant neoplasm of digestive organs: Secondary | ICD-10-CM | POA: Diagnosis present

## 2022-09-03 DIAGNOSIS — Z3202 Encounter for pregnancy test, result negative: Secondary | ICD-10-CM | POA: Diagnosis not present

## 2022-09-03 LAB — PREGNANCY, URINE: Preg Test, Ur: NEGATIVE

## 2022-09-07 ENCOUNTER — Encounter (HOSPITAL_COMMUNITY)
Admission: RE | Disposition: A | Discharge status: Home / Self Care | Payer: Self-pay | Source: Home / Self Care | Attending: Internal Medicine

## 2022-09-07 ENCOUNTER — Ambulatory Visit (HOSPITAL_COMMUNITY): Payer: 59 | Admitting: Anesthesiology

## 2022-09-07 ENCOUNTER — Other Ambulatory Visit: Payer: Self-pay

## 2022-09-07 ENCOUNTER — Ambulatory Visit (HOSPITAL_COMMUNITY)
Admission: RE | Admit: 2022-09-07 | Discharge: 2022-09-07 | Disposition: A | Discharge status: Home / Self Care | Payer: 59 | Attending: Internal Medicine | Admitting: Internal Medicine

## 2022-09-07 ENCOUNTER — Encounter (HOSPITAL_COMMUNITY): Payer: Self-pay

## 2022-09-07 DIAGNOSIS — D123 Benign neoplasm of transverse colon: Secondary | ICD-10-CM

## 2022-09-07 DIAGNOSIS — K648 Other hemorrhoids: Secondary | ICD-10-CM | POA: Diagnosis not present

## 2022-09-07 DIAGNOSIS — D126 Benign neoplasm of colon, unspecified: Secondary | ICD-10-CM | POA: Diagnosis not present

## 2022-09-07 DIAGNOSIS — K649 Unspecified hemorrhoids: Secondary | ICD-10-CM

## 2022-09-07 DIAGNOSIS — Z1211 Encounter for screening for malignant neoplasm of colon: Secondary | ICD-10-CM

## 2022-09-07 DIAGNOSIS — Z800 Family history of malignant neoplasm of digestive organs: Secondary | ICD-10-CM | POA: Diagnosis not present

## 2022-09-07 HISTORY — PX: COLONOSCOPY WITH PROPOFOL: SHX5780

## 2022-09-07 SURGERY — COLONOSCOPY WITH PROPOFOL
Anesthesia: General

## 2022-09-07 MED ORDER — PROPOFOL 500 MG/50ML IV EMUL
INTRAVENOUS | Status: DC | PRN
  Administered 2022-09-07: 150 ug/kg/min via INTRAVENOUS

## 2022-09-07 MED ORDER — LACTATED RINGERS IV SOLN: INTRAVENOUS | Status: DC | PRN

## 2022-09-07 MED ORDER — PROPOFOL 10 MG/ML IV BOLUS
INTRAVENOUS | Status: DC | PRN
  Administered 2022-09-07: 100 mg via INTRAVENOUS

## 2022-09-07 MED ORDER — STERILE WATER FOR IRRIGATION IR SOLN
Status: DC | PRN
  Administered 2022-09-07: 100 mL

## 2022-09-07 NOTE — Op Note (Signed)
[facility] Patient Name: Kimberly Ramsey Procedure Date: 09/07/2022 10:44 AM MRN: [id number] Date of Birth: 10-Mar-1985 Attending MD: Hennie Duos. Marletta Lor , [place], [id number] CSN: [id number] Age: 37 Admit Type: Outpatient Procedure:                Colonoscopy Indications:              Screening in patient at increased risk: Colorectal                            cancer in father before age 58 Providers:                Hennie Duos. Marletta Lor, DO, Buel Ream. Windell Hummingbird, RN,                            Lennice Sites Technician, Technician, Elinor Parkinson Referring MD:             Hennie Duos. Marletta Lor, DO Medicines:                See the Anesthesia note for documentation of the                            administered medications Complications:            No immediate complications. Estimated Blood Loss:     Estimated blood loss was minimal. Procedure:                Pre-Anesthesia Assessment:                           - The anesthesia plan was to use monitored                            anesthesia care (MAC).                           After obtaining informed consent, the colonoscope                            was passed under direct vision. Throughout the                            procedure, the patient's blood pressure, pulse, and                            oxygen saturations were monitored continuously. The                            [id number]-[id number] ([id number]) scope was introduced through                            the anus and advanced to the the cecum, identified                            by appendiceal orifice and ileocecal valve. The  colonoscopy was performed without difficulty. The                            patient tolerated the procedure well. The quality                            of the bowel preparation was evaluated using the                            BBPS [facility] Bowel Preparation Scale) with scores                            of: Right Colon = 3, Transverse  Colon = 3 and Left                            Colon = 3 (entire mucosa seen well with no residual                            staining, small fragments of stool or opaque                            liquid). The total BBPS score equals 9. Scope In: 10:57:55 AM Scope Out: 11:10:38 AM Scope Withdrawal Time: 0 hours 9 minutes 24 seconds  Total Procedure Duration: 0 hours 12 minutes 43 seconds  Findings:      Non-bleeding internal hemorrhoids were found during retroflexion.       Scarring from prior banding noted.      A 4 mm polyp was found in the transverse colon. The polyp was sessile.       The polyp was removed with a cold snare. Resection and retrieval were       complete.      The exam was otherwise without abnormality. Impression:               - Non-bleeding internal hemorrhoids.                           - One 4 mm polyp in the transverse colon, removed                            with a cold snare. Resected and retrieved.                           - The examination was otherwise normal. Moderate Sedation:      Per Anesthesia Care Recommendation:           - Patient has a contact number available for                            emergencies. The signs and symptoms of potential                            delayed complications were discussed with the  patient. Return to normal activities tomorrow.                            Written discharge instructions were provided to the                            patient.                           - Resume previous diet.                           - Continue present medications.                           - Await pathology results.                           - Repeat colonoscopy in 5 years for screening                            purposes.                           - Return to GI clinic PRN. Procedure Code(s):        --- Professional ---                           [phone], Colonoscopy, flexible; with removal of                             tumor(s), polyp(s), or other lesion(s) by snare                            technique Diagnosis Code(s):        --- Professional ---                           Z80.0, Family history of malignant neoplasm of                            digestive organs                           D12.3, Benign neoplasm of transverse colon (hepatic                            flexure or splenic flexure)                           K64.8, Other hemorrhoids CPT copyright 2022 American Medical Association. All rights reserved. The codes documented in this report are preliminary and upon coder review may  be revised to meet current compliance requirements. Hennie Duos. Marletta Lor, DO Hennie Duos. Marletta Lor, DO 09/07/2022 11:13:59 AM This report has been signed electronically. Number of Addenda: 0

## 2022-09-07 NOTE — Transfer of Care (Signed)
Immediate Anesthesia Transfer of Care Note  Patient: Kimberly Ramsey  Procedure(s) Performed: COLONOSCOPY WITH PROPOFOL  Patient Location: Endoscopy Unit  Anesthesia Type:General  Level of Consciousness: awake  Airway & Oxygen Therapy: Patient Spontanous Breathing  Post-op Assessment: Report given to RN and Post -op Vital signs reviewed and stable  Post vital signs: Reviewed and stable  Last Vitals:  Vitals Value Taken Time  BP 102/54 09/07/22 1114  Temp 36.6 C 09/07/22 1114  Pulse 80 09/07/22 1114  Resp 21 09/07/22 1114  SpO2 98 % 09/07/22 1114    Last Pain:  Vitals:   09/07/22 1114  TempSrc: Axillary  PainSc:       Patients Stated Pain Goal: 8 (09/07/22 1024)  Complications: No notable events documented.

## 2022-09-07 NOTE — H&P (Signed)
Primary Care Physician:  Tollie Eth, NP Primary Gastroenterologist:  Dr. Marletta Lor  Pre-Procedure History & Physical: HPI:  Kimberly Ramsey is a 37 y.o. female is here for a colonoscopy to be performed for high risk colon cancer screening purposes, family history of colon cancer in father at age 44  Past Medical History:  Diagnosis Date   Cleft lip and palate    Down syndrome in child of prior pregnancy, currently pregnant 01/16/2013   8yo daughter, echo normal, no complications   Encounter for IUD insertion 10/03/2013   Mirena 10/03/13           08/01/18 Removal, insertion Liletta   Rectal bleeding 06/29/2017   Thrombocytopenia complicating pregnancy (HCC)    Thyroid disease    followed by Dr. Fransico Him    Past Surgical History:  Procedure Laterality Date   CLEFT PALATE REPAIR     COLONOSCOPY N/A 07/21/2017   TI normal, torturous colon, internal hemorrhoids s/p banding. External hemorrhoids. Repeat colonoscopy in 5 years.    HEMORRHOID BANDING N/A 07/21/2017   Procedure: [place] Lions;  Surgeon: West Bali, MD;  [facility]: [facility];  Service: Endoscopy;  Laterality: N/A;   HIP SURGERY     took bone from hip to fix cleft palate.    Prior to Admission medications   Medication Sig Start Date End Date Taking? Authorizing Provider  aspirin-acetaminophen-caffeine (EXCEDRIN MIGRAINE) [phone] MG tablet Take 2 tablets by mouth 2 (two) times daily as needed for headache.   Yes [provider]  fluconazole (DIFLUCAN) 100 MG tablet Take 1 tablet (100 mg total) by mouth daily for 14 days. 08/26/22 09/09/22 Yes Adline Potter, NP  hydrocortisone (ANUSOL-HC) 2.5 % rectal cream Place 1 Application rectally 2 (two) times daily as needed (irritation/discomfort). 03/26/22  Yes [provider]  L-Lysine 1000 MG TABS Take 1,000 mg by mouth every evening.   Yes [provider]  levothyroxine (SYNTHROID) 75 MCG tablet Take 1 tablet (75 mcg total) by mouth daily  before breakfast. 04/01/22  Yes Nida, Denman George, MD  Multiple Vitamins-Minerals (MULTIVITAMIN WOMEN PO) Take 1 tablet by mouth every evening.   Yes [provider]    Allergies as of 08/04/2022 - Review Complete 08/03/2022  Allergen Reaction Noted   Codeine Nausea And Vomiting 12/27/2012    Family History  Problem Relation Age of Onset   Mental illness Mother    Schizophrenia Mother    Lupus Father    Colon cancer Father 77       surgery/chemo   Colon polyps Neg Hx     Social History   Socioeconomic History   Marital status: Single    Spouse name: Not on file   Number of children: Not on file   Years of education: Not on file   Highest education level: Not on file  Occupational History   Not on file  Tobacco Use   Smoking status: Never   Smokeless tobacco: Never  Vaping Use   Vaping status: Never Used  Substance and Sexual Activity   Alcohol use: No   Drug use: No   Sexual activity: Yes    Birth control/protection: I.U.D.  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   Social Determinants of Health   Financial Resource Strain: Low Risk  (05/08/2021)   Overall Financial Resource Strain (CARDIA)    Difficulty of Paying Living  Expenses: Not very hard  Food Insecurity: No Food Insecurity (05/08/2021)   Hunger Vital Sign    Worried About Running Out of Food in the Last Year: Never true    Ran Out of Food in the Last Year: Never true  Transportation Needs: No Transportation Needs (05/08/2021)   PRAPARE - Administrator, Civil Service (Medical): No    Lack of Transportation (Non-Medical): No  Physical Activity: Sufficiently Active (05/08/2021)   Exercise Vital Sign    Days of Exercise per Week: 5 days    Minutes of Exercise per Session: 60 min  Stress: No Stress Concern Present (05/08/2021)   [employer] of Occupational Health -  Occupational Stress Questionnaire    Feeling of Stress : Not at all  Social Connections: Moderately Isolated (05/08/2021)   Social Connection and Isolation Panel [NHANES]    Frequency of Communication with Friends and Family: Three times a week    Frequency of Social Gatherings with Friends and Family: Three times a week    Attends Religious Services: 1 to 4 times per year    Active Member of Clubs or Organizations: No    Attends Banker Meetings: Never    Marital Status: Never married  Intimate Partner Violence: Not At Risk (05/08/2021)   Humiliation, Afraid, Rape, and Kick questionnaire    Fear of Current or Ex-Partner: No    Emotionally Abused: No    Physically Abused: No    Sexually Abused: No    Review of Systems: See HPI, otherwise negative ROS  Physical Exam: Vital signs in last 24 hours: Temp:  [98.1 F (36.7 C)] 98.1 F (36.7 C) (07/30 1024) Pulse Rate:  [75] 75 (07/30 1024) Resp:  [22] 22 (07/30 1024) BP: (135)/(85) 135/85 (07/30 1024) SpO2:  [100 %] 100 % (07/30 1024) Weight:  [68 kg] 68 kg (07/30 1024)   General:   Alert,  Well-developed, well-nourished, pleasant and cooperative in NAD Head:  Normocephalic and atraumatic. Eyes:  Sclera clear, no icterus.   Conjunctiva pink. Ears:  Normal auditory acuity. Nose:  No deformity, discharge,  or lesions. Mouth:  No deformity or lesions, dentition normal. Neck:  Supple; no masses or thyromegaly. Lungs:  Clear throughout to auscultation.   No wheezes, crackles, or rhonchi. No acute distress. Heart:  Regular rate and rhythm; no murmurs, clicks, rubs,  or gallops. Abdomen:  Soft, nontender and nondistended. No masses, hepatosplenomegaly or hernias noted. Normal bowel sounds, without guarding, and without rebound.   Msk:  Symmetrical without gross deformities. Normal posture. Extremities:  Without clubbing or edema. Neurologic:  Alert and  oriented x4;  grossly normal neurologically. Skin:  Intact without  significant lesions or rashes. Cervical Nodes:  No significant cervical adenopathy. Psych:  Alert and cooperative. Normal mood and affect.  Impression/Plan: Kimberly Ramsey is here for a colonoscopy to be performed for high risk colon cancer screening purposes, family history of colon cancer in father at age 58  The risks of the procedure including infection, bleed, or perforation as well as benefits, limitations, alternatives and imponderables have been reviewed with the patient. Questions have been answered. All parties agreeable.

## 2022-09-07 NOTE — Discharge Instructions (Addendum)

## 2022-09-07 NOTE — Anesthesia Preprocedure Evaluation (Signed)
Anesthesia Evaluation  Patient identified by MRN, date of birth, ID band Patient awake    Reviewed: Allergy & Precautions, H&P , NPO status , Patient's Chart, lab work & pertinent test results, reviewed documented beta blocker date and time   Airway Mallampati: II  TM Distance: >3 FB Neck ROM: full    Dental no notable dental hx.    Pulmonary neg pulmonary ROS   Pulmonary exam normal breath sounds clear to auscultation       Cardiovascular Exercise Tolerance: Good negative cardio ROS  Rhythm:regular Rate:Normal     Neuro/Psych negative neurological ROS  negative psych ROS   GI/Hepatic negative GI ROS, Neg liver ROS,,,  Endo/Other  negative endocrine ROS Hyperthyroidism   Renal/GU negative Renal ROS  negative genitourinary   Musculoskeletal   Abdominal   Peds  Hematology negative hematology ROS (+)   Anesthesia Other Findings   Reproductive/Obstetrics negative OB ROS                             Anesthesia Physical Anesthesia Plan  ASA: 2  Anesthesia Plan: General   Post-op Pain Management:    Induction:   PONV Risk Score and Plan: Propofol infusion  Airway Management Planned:   Additional Equipment:   Intra-op Plan:   Post-operative Plan:   Informed Consent: I have reviewed the patients History and Physical, chart, labs and discussed the procedure including the risks, benefits and alternatives for the proposed anesthesia with the patient or authorized representative who has indicated his/her understanding and acceptance.     Dental Advisory Given  Plan Discussed with: CRNA  Anesthesia Plan Comments:        Anesthesia Quick Evaluation

## 2022-09-10 ENCOUNTER — Encounter (HOSPITAL_COMMUNITY): Payer: Self-pay | Admitting: Internal Medicine

## 2022-09-10 NOTE — Anesthesia Postprocedure Evaluation (Signed)
Anesthesia Post Note  Patient: Kimberly Ramsey  Procedure(s) Performed: COLONOSCOPY WITH PROPOFOL  Patient location during evaluation: Phase II Anesthesia Type: General Level of consciousness: awake Pain management: pain level controlled Vital Signs Assessment: post-procedure vital signs reviewed and stable Respiratory status: spontaneous breathing and respiratory function stable Cardiovascular status: blood pressure returned to baseline and stable Postop Assessment: no headache and no apparent nausea or vomiting Anesthetic complications: no Comments: Late entry   No notable events documented.   Last Vitals:  Vitals:   09/07/22 1024 09/07/22 1114  BP: 135/85 (!) 102/54  Pulse: 75 80  Resp: (!) 22 (!) 21  Temp: 36.7 C 36.6 C  SpO2: 100% 98%    Last Pain:  Vitals:   09/07/22 1115  TempSrc:   PainSc: 0-No pain                 Windell Norfolk

## 2022-09-28 ENCOUNTER — Encounter: Payer: Self-pay | Admitting: Gastroenterology

## 2022-09-30 ENCOUNTER — Ambulatory Visit: Payer: 59 | Admitting: "Endocrinology

## 2022-09-30 ENCOUNTER — Encounter: Payer: Self-pay | Admitting: "Endocrinology

## 2022-09-30 VITALS — BP 114/78 | HR 72 | Ht 65.0 in | Wt 171.8 lb

## 2022-09-30 DIAGNOSIS — E890 Postprocedural hypothyroidism: Secondary | ICD-10-CM

## 2022-09-30 DIAGNOSIS — E559 Vitamin D deficiency, unspecified: Secondary | ICD-10-CM | POA: Diagnosis not present

## 2022-09-30 MED ORDER — LEVOTHYROXINE SODIUM 75 MCG PO TABS: 75.0000 ug | ORAL_TABLET | Freq: Every day | ORAL | 1 refills | Status: DC

## 2022-09-30 NOTE — Progress Notes (Signed)
09/30/2022  Endocrinology follow-up note   Subjective:    Patient ID: Kimberly Ramsey, female    DOB: 10-09-1985, PCP Early, Sung Amabile, NP   Past Medical History:  Diagnosis Date   Cleft lip and palate    Down syndrome in child of prior pregnancy, currently pregnant 01/16/2013   37yo daughter, echo normal, no complications   Encounter for IUD insertion 10/03/2013   Mirena 10/03/13           08/01/18 Removal, insertion Liletta   Rectal bleeding 06/29/2017   Thrombocytopenia complicating pregnancy (HCC)    Thyroid disease    followed by Dr. Fransico Him   Past Surgical History:  Procedure Laterality Date   CLEFT PALATE REPAIR     COLONOSCOPY N/A 07/21/2017   TI normal, torturous colon, internal hemorrhoids s/p banding. External hemorrhoids. Repeat colonoscopy in 5 years.    COLONOSCOPY WITH PROPOFOL N/A 09/07/2022   Procedure: COLONOSCOPY WITH PROPOFOL;  Surgeon: Lanelle Bal, DO;  [facility];  Service: Endoscopy;  Laterality: N/A;  12:30pm, asa 2   HEMORRHOID BANDING N/A 07/21/2017   Procedure: HEMORRHOID BANDING;  Surgeon: West Bali, MD;  Location: [facility];  Service: Endoscopy;  Laterality: N/A;   HIP SURGERY     took bone from hip to fix cleft palate.   Social History   Socioeconomic History   Marital status: Single    Spouse name: Not on file   Number of children: Not on file   Years of education: Not on file   Highest education level: Not on file  Occupational History   Not on file  Tobacco Use   Smoking status: Never   Smokeless tobacco: Never  Vaping Use   Vaping status: Never Used  Substance and Sexual Activity   Alcohol use: No   Drug use: No   Sexual activity: Yes    Birth control/protection: I.U.D.  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   Social Determinants of Health   Financial Resource Strain: Low Risk  (05/08/2021)    Overall Financial Resource Strain (CARDIA)    Difficulty of Paying Living Expenses: Not very hard  Food Insecurity: No Food Insecurity (05/08/2021)   Hunger Vital Sign    Worried About Running Out of Food in the Last Year: Never true    Ran Out of Food in the Last Year: Never true  Transportation Needs: No Transportation Needs (05/08/2021)   PRAPARE - Administrator, Civil Service (Medical): No    Lack of Transportation (Non-Medical): No  Physical Activity: Sufficiently Active (05/08/2021)   Exercise Vital Sign    Days of Exercise per Week: 5 days    Minutes of Exercise per Session: 60 min  Stress: No Stress Concern Present (05/08/2021)   [employer] of Occupational Health - Occupational Stress Questionnaire    Feeling of Stress : Not at all  Social Connections: Moderately Isolated (05/08/2021)   Social Connection and Isolation Panel [NHANES]    Frequency of Communication with Friends and Family: Three times a week    Frequency of Social Gatherings with Friends and Family: Three times a week    Attends Religious Services: 1 to 4 times per year    Active Member of Clubs or Organizations: No    Attends Banker Meetings: Never    Marital Status: Never married   Outpatient Encounter  Medications as of 09/30/2022  Medication Sig   aspirin-acetaminophen-caffeine (EXCEDRIN MIGRAINE) 250-250-65 MG tablet Take 2 tablets by mouth 2 (two) times daily as needed for headache.   hydrocortisone (ANUSOL-HC) 2.5 % rectal cream Place 1 Application rectally 2 (two) times daily as needed (irritation/discomfort).   L-Lysine 1000 MG TABS Take 1,000 mg by mouth every evening.   levothyroxine (SYNTHROID) 75 MCG tablet Take 1 tablet (75 mcg total) by mouth daily before breakfast.   Multiple Vitamins-Minerals (MULTIVITAMIN WOMEN PO) Take 1 tablet by mouth every evening.   [DISCONTINUED] levothyroxine (SYNTHROID) 75 MCG tablet Take 1 tablet (75 mcg total) by mouth daily before breakfast.    No facility-administered encounter medications on file as of 09/30/2022.   ALLERGIES: Allergies  Allergen Reactions   Codeine Nausea And Vomiting    VACCINATION STATUS: Immunization History  Administered Date(s) Administered   PPD Test 01/05/2017   Tdap 08/18/2013    HPI 37 year old female patient with medical history as above. She is being seen in follow-up with repeat thyroid function tests for iodine induced hypothyroidism.  See notes from her prior visit.  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   She is currently on levothyroxine 75 mcg p.o. daily before breakfast.  Her previsit thyroid function tests are consistent with appropriate replacement.  She reports feeling better, has no new complaints today.   - She denies palpitations, heat intolerance, tremors.  She has a steady weight since last visit.    She has normal menstrual cycles, mother of 3 children.  She denies any family history of thyroid dysfunction. - She denies dysphagia, shortness of breath, voice change. She is on vitamin D supplement started due to vitamin D deficiency.  Review of Systems  Review of systems  Constitutional: + Minimally fluctuating body weight,  current  Body mass index is 28.59 kg/m. , no fatigue, no subjective hyperthermia, no subjective hypothermia   Objective:    BP 114/78   Pulse 72   Ht 5\' 5"  (1.651 m)   Wt 171 lb 12.8 oz (77.9 kg)   BMI 28.59 kg/m   Wt Readings from Last 3 Encounters:  09/30/22 171 lb 12.8 oz (77.9 kg)  09/07/22 150 lb (68 kg)  08/03/22 168 lb 6.4 oz (76.4 kg)    Physical Exam  Constitutional: + Overweight for height, not in acute distress, normal state of mind.    Eyes: PERRLA, EOMI, no exophthalmos ENT:  + Repaired cleft lip,  moist mucous  membranes, + no gross thyromegaly,  no cervical lymphadenopathy.     Recent Results (from the past 2160 hour(s))  Cervicovaginal ancillary only     Status: Abnormal   Collection Time: 08/24/22 11:00 AM  Result Value Ref Range   Bacterial Vaginitis (gardnerella) Negative    Candida Vaginitis Positive (A)    Candida Glabrata Positive (A)    Trichomonas Negative    Chlamydia Negative    Neisseria Gonorrhea Negative    Comment Normal Reference Range Candida Species - Negative    Comment      Normal Reference Range Bacterial Vaginosis - Negative   Comment Normal Reference Range Candida Galbrata - Negative    Comment Normal Reference Range Trichomonas - Negative    Comment Normal Reference Ranger Chlamydia - Negative    Comment      Normal Reference Range Neisseria  Gonorrhea - Negative  Pregnancy, urine     Status: None   Collection Time: 09/03/22 10:29 AM  Result Value Ref Range   Preg Test, Ur NEGATIVE NEGATIVE    Comment:        THE SENSITIVITY OF THIS METHODOLOGY IS >25 mIU/mL. Performed at [facility], [address], [place], [place] [address]   Surgical pathology     Status: None   Collection Time: 09/07/22 11:05 AM  Result Value Ref Range   SURGICAL PATHOLOGY      SURGICAL PATHOLOGY CASE: [id number] PATIENT: Beverly Sahm Surgical Pathology Report     Clinical History: Family history of colon cancer (crm)     FINAL MICROSCOPIC DIAGNOSIS:  A. COLON, TRANSVERSE, POLYPECTOMY: Tubular adenoma without high grade dysplasia.   GROSS DESCRIPTION:  Received in formalin is a tan, soft tissue fragment that is submitted in toto.  Size: 0.4 cm, 1 block submitted.  [facility] 09/07/2022)   Final Diagnosis performed by Theron Arista, MD.   Electronically signed 09/08/2022 Technical component performed at [facility], [address] [place], [place] [address].  Professional component performed at [employer]. [facility], [address] [place], [place] [address].  Immunohistochemistry Technical component (if applicable) was performed at [facility]. [address], [place], [place] [address].   IMMUNOHISTOCHEMISTRY DISCLAIMER (if applicable): Some of these immunohistoche mical stains may have been developed and the performance characteristics determine by [facility]. Some may not have been cleared or approved by the U.S. Food and Drug Administration. The FDA has determined that such clearance or approval is not necessary. This test is used for clinical purposes. It should not be regarded as investigational or for research. This laboratory is certified under the Clinical Laboratory Improvement Amendments of 1988 (CLIA-88) as qualified to perform high complexity clinical laboratory testing.  The controls stained appropriately.   IHC stains are performed on formalin fixed, paraffin embedded tissue using a 3,3"diaminobenzidine (DAB) chromogen and Leica Bond Autostainer System. The staining intensity of the nucleus is score manually and is reported as the percentage of tumor cell nuclei demonstrating specific nuclear staining. The specimens are fixed in 10% Neutral Formalin for at least 6 hours and up to 72hrs. These tests are va lidated on decalcified tissue. Results should be interpreted with caution given the possibility of false negative results on decalcified specimens. Antibody Clones are as follows ER-clone 25F, PR-clone 16, Ki67- clone MM1. Some of these immunohistochemical stains may have been developed and the performance characteristics determined by [facility].   TSH     Status: Abnormal   Collection Time: 09/27/22  9:29 AM  Result Value Ref Range   TSH 0.308 (L) 0.450 - 4.500 uIU/mL  T4, free     Status: None   Collection Time: 09/27/22  9:29 AM  Result Value Ref Range   Free T4 1.37 0.82 - 1.77 ng/dL     On 13/24/4010 she underwent thyroid uptake and  scan which showed 26% (normal 10-30 percent) uniform uptake with no nodularity.  Thyroid uptake and scan on August 07, 2021: FINDINGS: Cold nodule inferior pole RIGHT lobe.   No additional warm or cold nodules identified.   4 hour I-123 uptake = 30.6% (normal 5-20%), 24 hour I-123 uptake = 47.1% (normal 10-30%)   IMPRESSION: Elevated 4 hour and 24 hour radio iodine uptakes consistent with hyperthyroidism.   Cold nodule at inferior pole RIGHT thyroid lobe; thyroid ultrasound evaluation recommended to  exclude mass/malignancy.  He underwent fine-needle aspiration biopsy with benign outcomes.  Assessment & Plan:    1.  RAI induced hypothyroidism  2.  Left lobe cold nodule- -she is status post radioactive iodine thyroid ablation for hyperthyroidism with thyroid nodule.  Her treatment was administered on September 11, 2021.  She was recently started on levothyroxine for RAI induced hypothyroidism.  Her current dose is 75 mcg of levothyroxine p.o. daily before breakfast.  Her previsit labs are consistent with appropriate replacement.   - We discussed about the correct intake of her thyroid hormone, on empty stomach at fasting, with water, separated by at least 30 minutes from breakfast and other medications,  and separated by more than 4 hours from calcium, iron, multivitamins, acid reflux medications (PPIs). -Patient is made aware of the fact that thyroid hormone replacement is needed for life, dose to be adjusted by periodic monitoring of thyroid function tests.  3. Vitamin D deficiency She is now vitamin D replete at  55 , she only need maintenance dose . She was previously found to have hypervitaminosis B12. Advised to hold.  - I advised patient to maintain close follow up with Early, Sung Amabile, NP for primary care needs.   I spent  21  minutes in the care of the patient today including review of labs from Thyroid Function, CMP, and other relevant labs ; imaging/biopsy records (current and  previous including abstractions from other facilities); face-to-face time discussing  her lab results and symptoms, medications doses, her options of short and long term treatment based on the latest standards of care / guidelines;   and documenting the encounter.  Kimberly Ramsey  participated in the discussions, expressed understanding, and voiced agreement with the above plans.  All questions were answered to her satisfaction. she is encouraged to contact clinic should she have any questions or concerns prior to her return visit.   Follow up plan: Return in about 6 months (around 04/02/2023) for F/U with Pre-visit Labs.  Marquis Lunch, MD Phone: [phone]  Fax: [phone]   09/30/2022, 12:51 PM

## 2022-11-09 ENCOUNTER — Encounter: Payer: Self-pay | Admitting: Women's Health

## 2022-11-10 ENCOUNTER — Other Ambulatory Visit (HOSPITAL_COMMUNITY)
Admission: RE | Admit: 2022-11-10 | Discharge: 2022-11-10 | Disposition: A | Discharge status: Home / Self Care | Payer: 59 | Source: Ambulatory Visit | Attending: Obstetrics & Gynecology | Admitting: Obstetrics & Gynecology

## 2022-11-10 ENCOUNTER — Other Ambulatory Visit (INDEPENDENT_AMBULATORY_CARE_PROVIDER_SITE_OTHER): Payer: 59 | Admitting: *Deleted

## 2022-11-10 DIAGNOSIS — N898 Other specified noninflammatory disorders of vagina: Secondary | ICD-10-CM | POA: Insufficient documentation

## 2022-11-10 DIAGNOSIS — N9489 Other specified conditions associated with female genital organs and menstrual cycle: Secondary | ICD-10-CM

## 2022-11-10 NOTE — Progress Notes (Signed)
   NURSE VISIT- VAGINITIS/STD  SUBJECTIVE:  Kimberly Ramsey is a 37 y.o. [id number] GYN patientfemale here for a vaginal swab for vaginitis screening, STD screen.  She reports the following symptoms:  vaginal discharge, vaginal itching and vaginal burning  for 1 week. Denies abnormal vaginal bleeding, significant pelvic pain, fever, or UTI symptoms.  OBJECTIVE:  There were no vitals taken for this visit.  Appears well, in no apparent distress  ASSESSMENT: Vaginal swab for vaginitis screening & STD screening  PLAN: Self-collected vaginal probe for Gonorrhea, Chlamydia, Trichomonas, Bacterial Vaginosis, Yeast sent to lab Treatment: to be determined once results are received Follow-up as needed if symptoms persist/worsen, or new symptoms develop  Malachy Mood  11/10/2022 11:13 AM

## 2022-11-15 LAB — CERVICOVAGINAL ANCILLARY ONLY
Bacterial Vaginitis (gardnerella): NEGATIVE
Candida Glabrata: NEGATIVE
Candida Vaginitis: POSITIVE — AB
Chlamydia: NEGATIVE
Comment: NEGATIVE
Comment: NEGATIVE
Comment: NEGATIVE
Comment: NEGATIVE
Comment: NEGATIVE
Comment: NORMAL
Neisseria Gonorrhea: NEGATIVE
Trichomonas: NEGATIVE

## 2022-11-16 ENCOUNTER — Other Ambulatory Visit: Payer: Self-pay | Admitting: Adult Health

## 2022-11-16 MED ORDER — FLUCONAZOLE 150 MG PO TABS: ORAL_TABLET | ORAL | 1 refills | Status: DC

## 2023-02-07 ENCOUNTER — Other Ambulatory Visit: Payer: Self-pay | Admitting: Women's Health

## 2023-02-07 ENCOUNTER — Encounter: Payer: Self-pay | Admitting: Women's Health

## 2023-02-07 MED ORDER — VALACYCLOVIR HCL 1 G PO TABS: 1000.0000 mg | ORAL_TABLET | Freq: Every day | ORAL | 3 refills | Status: AC

## 2023-02-09 DIAGNOSIS — R519 Headache, unspecified: Secondary | ICD-10-CM

## 2023-02-09 HISTORY — DX: Headache, unspecified: R51.9

## 2023-02-12 ENCOUNTER — Encounter: Payer: Self-pay | Admitting: Women's Health

## 2023-02-28 ENCOUNTER — Encounter: Payer: Self-pay | Admitting: Obstetrics & Gynecology

## 2023-02-28 ENCOUNTER — Ambulatory Visit: Payer: 59 | Admitting: Obstetrics & Gynecology

## 2023-02-28 VITALS — BP 127/80 | HR 81 | Ht 65.0 in | Wt 176.0 lb

## 2023-02-28 DIAGNOSIS — N83201 Unspecified ovarian cyst, right side: Secondary | ICD-10-CM | POA: Diagnosis not present
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
## 2023-03-31 LAB — TSH: TSH: 1.21 u[IU]/mL (ref 0.450–4.500)

## 2023-03-31 LAB — T4, FREE: Free T4: 1.33 ng/dL (ref 0.82–1.77)

## 2023-04-04 ENCOUNTER — Other Ambulatory Visit: Payer: Self-pay | Admitting: "Endocrinology

## 2023-04-05 ENCOUNTER — Encounter: Payer: Self-pay | Admitting: "Endocrinology

## 2023-04-05 ENCOUNTER — Ambulatory Visit: Payer: 59 | Admitting: "Endocrinology

## 2023-04-05 VITALS — BP 126/88 | HR 72 | Ht 65.0 in | Wt 172.2 lb

## 2023-04-05 DIAGNOSIS — E890 Postprocedural hypothyroidism: Secondary | ICD-10-CM | POA: Diagnosis not present

## 2023-04-05 DIAGNOSIS — E559 Vitamin D deficiency, unspecified: Secondary | ICD-10-CM | POA: Diagnosis not present

## 2023-04-05 NOTE — Progress Notes (Signed)
 04/05/2023  Endocrinology follow-up note   Subjective:    Patient ID: Kimberly Ramsey, female    DOB: 1985-07-05, PCP Early, Sung Amabile, NP   Past Medical History:  Diagnosis Date   Cleft lip and palate    Down syndrome in child of prior pregnancy, currently pregnant 01/16/2013   38yo daughter, echo normal, no complications   Encounter for IUD insertion 10/03/2013   Mirena 10/03/13           08/01/18 Removal, insertion Liletta   Rectal bleeding 06/29/2017   Thrombocytopenia complicating pregnancy (HCC)    Thyroid disease    followed by Dr. Fransico Him   Past Surgical History:  Procedure Laterality Date   CLEFT PALATE REPAIR     COLONOSCOPY N/A 07/21/2017   TI normal, torturous colon, internal hemorrhoids s/p banding. External hemorrhoids. Repeat colonoscopy in 5 years.    COLONOSCOPY WITH PROPOFOL N/A 09/07/2022   Procedure: COLONOSCOPY WITH PROPOFOL;  Surgeon: Lanelle Bal, DO;  Location: [facility];  Service: Endoscopy;  Laterality: N/A;  12:30pm, asa 2   HEMORRHOID BANDING N/A 07/21/2017   Procedure: HEMORRHOID BANDING;  Surgeon: West Bali, MD;  Location: [facility];  Service: Endoscopy;  Laterality: N/A;   HIP SURGERY     took bone from hip to fix cleft palate.   Social History   Socioeconomic History   Marital status: Single    Spouse name: Not on file   Number of children: Not on file   Years of education: Not on file   Highest education level: Not on file  Occupational History   Not on file  Tobacco Use   Smoking status: Never   Smokeless tobacco: Never  Vaping Use   Vaping status: Never Used  Substance and Sexual Activity   Alcohol use: No   Drug use: No   Sexual activity: Yes    Birth control/protection: I.U.D.  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   Social Drivers of Corporate investment banker Strain: Low Risk  (05/08/2021)    Overall Financial Resource Strain (CARDIA)    Difficulty of Paying Living Expenses: Not very hard  Food Insecurity: No Food Insecurity (05/08/2021)   Hunger Vital Sign    Worried About Running Out of Food in the Last Year: Never true    Ran Out of Food in the Last Year: Never true  Transportation Needs: No Transportation Needs (05/08/2021)   PRAPARE - Administrator, Civil Service (Medical): No    Lack of Transportation (Non-Medical): No  Physical Activity: Sufficiently Active (05/08/2021)   Exercise Vital Sign    Days of Exercise per Week: 5 days    Minutes of Exercise per Session: 60 min  Stress: No Stress Concern Present (05/08/2021)   [employer] of Occupational Health - Occupational Stress Questionnaire    Feeling of Stress : Not at all  Social Connections: Moderately Isolated (05/08/2021)   Social Connection and Isolation Panel [NHANES]    Frequency of Communication with Friends and Family: Three times a week    Frequency of Social Gatherings with Friends and Family: Three times a week    Attends Religious Services: 1 to 4 times per year    Active Member of Clubs or Organizations: No    Attends Banker Meetings: Never    Marital Status: Never married   Outpatient Encounter  Medications as of 04/05/2023  Medication Sig   fluconazole (DIFLUCAN) 150 MG tablet Take 1 now and 1 in 3 days (Patient not taking: Reported on 02/28/2023)   aspirin-acetaminophen-caffeine (EXCEDRIN MIGRAINE) 250-250-65 MG tablet Take 2 tablets by mouth 2 (two) times daily as needed for headache.   hydrocortisone (ANUSOL-HC) 2.5 % rectal cream Place 1 Application rectally 2 (two) times daily as needed (irritation/discomfort). (Patient not taking: Reported on 02/28/2023)   L-Lysine 1000 MG TABS Take 1,000 mg by mouth. (Patient not taking: Reported on 02/28/2023)   levothyroxine (SYNTHROID) 75 MCG tablet TAKE 1 TABLET BY MOUTH DAILY BEFORE BREAKFAST.   Multiple Vitamins-Minerals  (MULTIVITAMIN WOMEN PO) Take 1 tablet by mouth.   valACYclovir (VALTREX) 1000 MG tablet Take 1 tablet (1,000 mg total) by mouth daily.   No facility-administered encounter medications on file as of 04/05/2023.   ALLERGIES: Allergies  Allergen Reactions   Codeine Nausea And Vomiting    VACCINATION STATUS: Immunization History  Administered Date(s) Administered   PPD Test 01/05/2017   Tdap 08/18/2013    HPI 38 year old female patient with medical history as above. She is being seen in follow-up with repeat thyroid function tests for iodine induced hypothyroidism.  See notes from her prior visit.  After appropriate workup including with thyroid uptake and scan on August 07, 2021 confirmed primary hyperthyroidism from Lamping' disease she underwent radioactive iodine thyroid ablation in August 2023 subsequent response with RAI induced hypothyroidism.   She is currently on levothyroxine 75 mcg p.o. daily before breakfast.    Her previsit thyroid function tests are consistent with appropriate replacement.  She reports feeling better, has no new complaints today.   - She denies palpitations, heat intolerance, tremors.  She has a steady weight since last visit.    She has normal menstrual cycles, mother of 3 children.  She denies any family history of thyroid dysfunction. - She denies dysphagia, shortness of breath, voice change. She is on vitamin D supplement started due to vitamin D deficiency.  Review of Systems  Review of systems  Constitutional: + Minimally fluctuating body weight,  current  Body mass index is 28.66 kg/m. , no fatigue, no subjective hyperthermia, no subjective hypothermia   Objective:    BP 126/88   Pulse 72   Ht 5\' 5"  (1.651 m)   Wt 172 lb 3.2 oz (78.1 kg)   BMI 28.66 kg/m   Wt Readings from Last 3 Encounters:  04/05/23 172 lb 3.2 oz (78.1 kg)  02/28/23 176 lb (79.8 kg)  09/30/22 171 lb 12.8 oz (77.9 kg)    Physical Exam  Constitutional: + Overweight  for height, not in acute distress, normal state of mind.    Eyes: PERRLA, EOMI, no exophthalmos ENT:  + Repaired cleft lip,  moist mucous membranes, + no gross thyromegaly,  no cervical lymphadenopathy.     Recent Results (from the past 2160 hours)  TSH     Status: None   Collection Time: 03/30/23  8:00 AM  Result Value Ref Range   TSH 1.210 0.450 - 4.500 uIU/mL  T4, free     Status: None   Collection Time: 03/30/23  8:00 AM  Result Value Ref Range   Free T4 1.33 0.82 - 1.77 ng/dL     On 09/81/1914 she underwent thyroid uptake and scan which showed 26% (normal 10-30 percent) uniform uptake with no nodularity.  Thyroid uptake and scan on August 07, 2021: FINDINGS: Cold nodule inferior pole RIGHT lobe.   No additional  warm or cold nodules identified.   4 hour I-123 uptake = 30.6% (normal 5-20%), 24 hour I-123 uptake = 47.1% (normal 10-30%)   IMPRESSION: Elevated 4 hour and 24 hour radio iodine uptakes consistent with hyperthyroidism.   Cold nodule at inferior pole RIGHT thyroid lobe; thyroid ultrasound evaluation recommended to exclude mass/malignancy.  He underwent fine-needle aspiration biopsy with benign outcomes.  Assessment & Plan:    1.  RAI induced hypothyroidism  2.  Left lobe cold nodule-FNA negative for malignancy. -she is status post radioactive iodine thyroid ablation for hyperthyroidism with thyroid nodule.  Her treatment was administered on September 11, 2021.  She was subsequently started on levothyroxine for RAI induced hypothyroidism.  She is currently on levothyroxine 75 mcg p.o. daily before breakfast.  Her previsit labs are consistent with appropriate replacement.  She is advised to continue same dose.  - We discussed about the correct intake of her thyroid hormone, on empty stomach at fasting, with water, separated by at least 30 minutes from breakfast and other medications,  and separated by more than 4 hours from calcium, iron, multivitamins, acid reflux  medications (PPIs). -Patient is made aware of the fact that thyroid hormone replacement is needed for life, dose to be adjusted by periodic monitoring of thyroid function tests.  3. Vitamin D deficiency She is not on vitamin D currently.  She will have vitamin D be measured before her next visit.     - I advised patient to maintain close follow up with Early, Sung Amabile, NP for primary care needs.   I spent  21  minutes in the care of the patient today including review of labs from Thyroid Function, CMP, and other relevant labs ; imaging/biopsy records (current and previous including abstractions from other facilities); face-to-face time discussing  her lab results and symptoms, medications doses, her options of short and long term treatment based on the latest standards of care / guidelines;   and documenting the encounter.  Kimberly Ramsey  participated in the discussions, expressed understanding, and voiced agreement with the above plans.  All questions were answered to her satisfaction. she is encouraged to contact clinic should she have any questions or concerns prior to her return visit.    Follow up plan: Return in about 6 months (around 10/03/2023) for Fasting Labs  in AM B4 8.  Marquis Lunch, MD Phone: [phone]  Fax: [phone]   04/05/2023, 12:43 PM

## 2023-04-07 ENCOUNTER — Other Ambulatory Visit: Payer: Self-pay | Admitting: Neurology

## 2023-04-07 DIAGNOSIS — G44309 Post-traumatic headache, unspecified, not intractable: Secondary | ICD-10-CM

## 2023-04-18 ENCOUNTER — Ambulatory Visit
Admission: RE | Admit: 2023-04-18 | Discharge: 2023-04-18 | Disposition: A | Discharge status: Home / Self Care | Payer: Worker's Compensation | Source: Ambulatory Visit | Attending: Neurology | Admitting: Neurology

## 2023-04-18 DIAGNOSIS — G44309 Post-traumatic headache, unspecified, not intractable: Secondary | ICD-10-CM

## 2023-05-19 ENCOUNTER — Other Ambulatory Visit (HOSPITAL_COMMUNITY)
Admission: RE | Admit: 2023-05-19 | Discharge: 2023-05-19 | Disposition: A | Discharge status: Home / Self Care | Source: Ambulatory Visit | Attending: Women's Health | Admitting: Women's Health

## 2023-05-19 ENCOUNTER — Ambulatory Visit: Admitting: Women's Health

## 2023-05-19 ENCOUNTER — Encounter: Payer: Self-pay | Admitting: Women's Health

## 2023-05-19 VITALS — BP 120/82 | HR 80 | Ht 65.0 in | Wt 170.0 lb

## 2023-05-19 DIAGNOSIS — Z01419 Encounter for gynecological examination (general) (routine) without abnormal findings: Secondary | ICD-10-CM | POA: Diagnosis not present

## 2023-05-19 DIAGNOSIS — N920 Excessive and frequent menstruation with regular cycle: Secondary | ICD-10-CM | POA: Insufficient documentation

## 2023-05-19 DIAGNOSIS — D179 Benign lipomatous neoplasm, unspecified: Secondary | ICD-10-CM

## 2023-05-19 NOTE — Addendum Note (Signed)
 Addended by: Shawna Clamp R on: 05/19/2023 11:18 AM   Modules accepted: Orders
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
## 2023-05-19 NOTE — Patient Instructions (Signed)
[facility] [phone]

## 2023-05-20 DIAGNOSIS — N920 Excessive and frequent menstruation with regular cycle: Secondary | ICD-10-CM | POA: Diagnosis present

## 2023-05-24 ENCOUNTER — Encounter: Payer: Self-pay | Admitting: Women's Health

## 2023-05-24 LAB — CERVICOVAGINAL ANCILLARY ONLY
Bacterial Vaginitis (gardnerella): POSITIVE — AB
Candida Glabrata: POSITIVE — AB
Candida Vaginitis: NEGATIVE
Chlamydia: NEGATIVE
Comment: NEGATIVE
Comment: NEGATIVE
Comment: NEGATIVE
Comment: NEGATIVE
Comment: NEGATIVE
Comment: NORMAL
Neisseria Gonorrhea: NEGATIVE
Trichomonas: NEGATIVE

## 2023-05-24 MED ORDER — METRONIDAZOLE 500 MG PO TABS: 500.0000 mg | ORAL_TABLET | Freq: Two times a day (BID) | ORAL | 0 refills | Status: DC

## 2023-05-24 MED ORDER — BORIC ACID VAGINAL 600 MG VA SUPP: 600.0000 mg | Freq: Every day | VAGINAL | 0 refills | Status: DC

## 2023-05-24 NOTE — Addendum Note (Signed)
 Addended by: Ferd Householder on: 05/24/2023 03:46 PM   Modules accepted: Orders

## 2023-05-31 ENCOUNTER — Ambulatory Visit: Admitting: Obstetrics and Gynecology

## 2023-06-16 ENCOUNTER — Ambulatory Visit: Admitting: Women's Health

## 2023-06-16 ENCOUNTER — Encounter: Payer: Self-pay | Admitting: Women's Health

## 2023-06-16 ENCOUNTER — Other Ambulatory Visit (HOSPITAL_COMMUNITY)
Admission: RE | Admit: 2023-06-16 | Discharge: 2023-06-16 | Disposition: A | Discharge status: Home / Self Care | Source: Ambulatory Visit | Attending: Women's Health | Admitting: Women's Health

## 2023-06-16 ENCOUNTER — Ambulatory Visit: Payer: Self-pay | Admitting: Surgery

## 2023-06-16 VITALS — BP 127/88 | HR 82 | Ht 66.0 in | Wt 166.0 lb

## 2023-06-16 DIAGNOSIS — Z30430 Encounter for insertion of intrauterine contraceptive device: Secondary | ICD-10-CM

## 2023-06-16 DIAGNOSIS — Z5189 Encounter for other specified aftercare: Secondary | ICD-10-CM | POA: Diagnosis present

## 2023-06-16 LAB — POCT URINE PREGNANCY: Preg Test, Ur: NEGATIVE

## 2023-06-16 MED ORDER — LEVONORGESTREL 20 MCG/DAY IU IUD
1.0000 | INTRAUTERINE_SYSTEM | Freq: Once | INTRAUTERINE | Status: AC
  Administered 2023-06-16: 1 via INTRAUTERINE

## 2023-06-16 NOTE — Patient Instructions (Signed)
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
## 2023-06-16 NOTE — Progress Notes (Signed)
 IUD INSERTION Patient name: Kimberly Ramsey MRN [id number]  Date of birth: 05/17/1985 Subjective Findings:   Kimberly Ramsey is a 38 y.o. [id number] African American female being seen today for insertion of a Mirena  IUD. Had BV & candida glabrata in April, finished all meds, wants to do swab to make sure all neg.  Patient's last menstrual period was 05/02/2023. Last sexual intercourse was >2wks ago Last pap3/31/23. Results were: NILM w/ HRHPV negative  The risks and benefits of the method and placement have been thouroughly reviewed with the patient and all questions were answered.  Specifically the patient is aware of failure rate of 02/998, expulsion of the IUD and of possible perforation.  The patient is aware of irregular bleeding due to the method and understands the incidence of irregular bleeding diminishes with time.  Signed copy of informed consent in chart.      05/19/2023   10:28 AM 05/17/2022    8:47 AM 05/08/2021   10:52 AM 04/28/2021    2:15 PM 08/07/2020   10:46 AM  Depression screen PHQ 2/9  Decreased Interest 0 0 0 0 0  Down, Depressed, Hopeless 0 0 0 0 0  PHQ - 2 Score 0 0 0 0 0  Altered sleeping 2 1 0  0  Tired, decreased energy 1 1 0  0  Change in appetite 1 0 0  0  Feeling bad or failure about yourself  0 0 0  0  Trouble concentrating 0 0 0  0  Moving slowly or fidgety/restless 0 0 0  0  Suicidal thoughts 0 0 0  0  PHQ-9 Score 4 2 0  0  Difficult doing work/chores     Not difficult at all        05/19/2023   10:28 AM 05/17/2022    8:47 AM 05/08/2021   10:52 AM 03/26/2020    9:33 AM  GAD 7 : Generalized Anxiety Score  Nervous, Anxious, on Edge 0 0 0 0  Control/stop worrying 2 0 0 0  Worry too much - different things 1 0 0 0  Trouble relaxing 1 0 0 0  Restless 0 0 0 0  Easily annoyed or irritable 0 0 0 0  Afraid - awful might happen 0 0 0 0  Total GAD 7 Score 4 0 0 0  Anxiety Difficulty    Not difficult at all     Pertinent History Reviewed:   Reviewed past  medical,surgical, social, obstetrical and family history.  Reviewed problem list, medications and allergies. Objective Findings & Procedure:   Vitals:   06/16/23 1034  BP: 127/88  Pulse: 82  Weight: 166 lb (75.3 kg)  Height: 5\' 6"  (1.676 m)  Body mass index is 26.79 kg/m.  Results for orders placed or performed in visit on 06/16/23 (from the past 24 hours)  POCT urine pregnancy   Collection Time: 06/16/23 10:34 AM  Result Value Ref Range   Preg Test, Ur Negative Negative     Time out was performed.  A Sumler speculum was placed in the vagina.  The cervix was visualized, prepped using Betadine, and grasped with a single tooth tenaculum. The uterus was found to be retroflexed and it sounded to 8 cm.  Mirena   IUD placed per manufacturer's recommendations. The strings were trimmed to approximately 3 cm. The patient tolerated the procedure well.   Informal transvaginal sonogram was performed and the proper placement of the IUD was verified.  Chaperone: Lorean Rodes Assessment &  Plan:   1) Mirena  IUD insertion The patient was given post procedure instructions, including signs and symptoms of infection and to check for the strings after each menses or each month, and refraining from intercourse or anything in the vagina for 3 days. She was given a care card with date IUD placed, and date IUD to be removed. She is scheduled for a f/u appointment in 4 weeks.  2) Recent BV and candida glabrata> s/p tx, wanted to do CV swab to make sure all neg  Orders Placed This Encounter  Procedures   POCT urine pregnancy    Return in about 4 weeks (around 07/14/2023) for IUD f/u, CNM, in person.  Ferd Householder CNM, [facility] 06/16/2023 11:09 AM

## 2023-06-17 LAB — CERVICOVAGINAL ANCILLARY ONLY
Bacterial Vaginitis (gardnerella): NEGATIVE
Candida Glabrata: NEGATIVE
Candida Vaginitis: NEGATIVE
Chlamydia: NEGATIVE
Comment: NEGATIVE
Comment: NEGATIVE
Comment: NEGATIVE
Comment: NEGATIVE
Comment: NEGATIVE
Comment: NORMAL
Neisseria Gonorrhea: NEGATIVE
Trichomonas: NEGATIVE

## 2023-06-20 ENCOUNTER — Encounter: Payer: Self-pay | Admitting: Women's Health

## 2023-07-07 NOTE — Progress Notes (Signed)
 COVID Vaccine received:  [x]  No []  Yes Date of any COVID positive Test in last 90 days: none  PCP - Karlton Overly at [facility] [phone]   Cardiologist - none Endocrinology- Frann Ivans, MD  Chest x-ray - 09-25-2016  2v  Epic EKG - 07-08-2023 Stress Test -  ECHO -  Cardiac Cath -  CT Coronary Calcium score:   Bowel Prep - [x]  No  []   Yes ______  Pacemaker / ICD device [x]  No []  Yes   Spinal Cord Stimulator:[x]  No []  Yes       History of Sleep Apnea? [x]  No []  Yes   CPAP used?- [x]  No []  Yes    Does the patient monitor blood sugar?   [x]  N/A   []  No []  Yes  Patient has: [x]  NO Hx DM   []  Pre-DM   []  DM1  []   DM2 Last A1c was:  4.9 on  04-28-2021      Blood Thinner / Instructions: none Aspirin Instructions:   none  ERAS Protocol Ordered: [x]  No  []  Yes Patient is to be NPO after: MN  Dental hx: []  Dentures:  []  N/A      [x]  Bridge or Partial: has top retainer that come out. Bottom retainer is permanent.                   []  Loose or Damaged teeth:       Activity level: Able to walk up 2 flights of stairs without becoming significantly short of breath or having chest pain?  []  No   [x]    Yes   Anesthesia review: S/p cleft lip / palate repair 2003, Hashimoto's thyroiditis, thrombocytopenia  Patient denies shortness of breath, fever, cough and chest pain at PAT appointment.  Patient verbalized understanding and agreement to the Pre-Surgical Instructions that were given to them at this PAT appointment. Patient was also educated of the need to review these PAT instructions again prior to her surgery.I reviewed the appropriate phone numbers to call if they have any and questions or concerns.

## 2023-07-07 NOTE — Patient Instructions (Signed)
 SURGICAL WAITING ROOM VISITATION Patients having surgery or a procedure may have no more than 2 support people in the waiting area - these visitors may rotate in the visitor waiting room.   If the patient needs to stay at the hospital during part of their recovery, the visitor guidelines for inpatient rooms apply.  PRE-OP VISITATION  Pre-op nurse will coordinate an appropriate time for 1 support person to accompany the patient in pre-op.  This support person may not rotate.  This visitor will be contacted when the time is appropriate for the visitor to come back in the pre-op area.  Please refer to the [facility] website for the visitor guidelines for Inpatients (after your surgery is over and you are in a regular room).  You are not required to quarantine at this time prior to your surgery. However, you must do this: Hand Hygiene often Do NOT share personal items Notify your provider if you are in close contact with someone who has COVID or you develop fever 100.4 or greater, new onset of sneezing, cough, sore throat, shortness of breath or body aches.  If you test positive for Covid or have been in contact with anyone that has tested positive in the last 10 days please notify you surgeon.    Your procedure is scheduled on: Wednesday   July 13, 2023   Report to [facility] Main Entrance: [address] entrance where the [employer] is available.   Report to admitting at:   07:45  AM  Call this number if you have any questions or problems the morning of surgery [phone]  DO NOT EAT OR DRINK ANYTHING AFTER MIDNIGHT THE NIGHT PRIOR TO YOUR SURGERY / PROCEDURE.   FOLLOW  ANY ADDITIONAL PRE OP INSTRUCTIONS YOU RECEIVED FROM YOUR SURGEON'S OFFICE!!!   Oral Hygiene is also important to reduce your risk of infection.        Remember - BRUSH YOUR TEETH THE MORNING OF SURGERY WITH YOUR REGULAR TOOTHPASTE  Do NOT smoke after Midnight the night before surgery.  STOP TAKING all  Vitamins, Herbs and supplements 1 week before your surgery.   Take ONLY these medicines the morning of surgery with A SIP OF WATER : levothyroxine                    You may not have any metal on your body including hair pins, jewelry, and body piercing  Do not wear make-up, lotions, powders, perfumes or deodorant  Do not wear nail polish including gel and S&S, artificial / acrylic nails, or any other type of covering on natural nails including finger and toenails. If you have artificial nails, gel coating, etc., that needs to be removed by a nail salon, Please have this removed prior to surgery. Not doing so may mean that your surgery could be cancelled or delayed if the Surgeon or anesthesia staff feels like they are unable to monitor you safely.   Do not shave 48 hours prior to surgery to avoid nicks in your skin which may contribute to postoperative infections.   Contacts, Hearing Aids, dentures or bridgework may not be worn into surgery. DENTURES WILL BE REMOVED PRIOR TO SURGERY PLEASE DO NOT APPLY "Poly grip" OR ADHESIVES!!!  Patients discharged on the day of surgery will not be allowed to drive home.  Someone NEEDS to stay with you for the first 24 hours after anesthesia.  Do not bring your home medications to the hospital. The Pharmacy will dispense medications listed  on your medication list to you during your admission in the Hospital.  Please read over the following fact sheets you were given: IF YOU HAVE QUESTIONS ABOUT YOUR PRE-OP INSTRUCTIONS, PLEASE CALL [phone].   [place] - Preparing for Surgery Before surgery, you can play an important role.  Because skin is not sterile, your skin needs to be as free of germs as possible.  You can reduce the number of germs on your skin by washing with CHG (chlorahexidine gluconate) soap before surgery.  CHG is an antiseptic cleaner which kills germs and bonds with the skin to continue killing germs even after washing. Please DO NOT use  if you have an allergy to CHG or antibacterial soaps.  If your skin becomes reddened/irritated stop using the CHG and inform your nurse when you arrive at Short Stay. Do not shave (including legs and underarms) for at least 48 hours prior to the first CHG shower.  You may shave your face/neck.  Please follow these instructions carefully:  1.  Shower with CHG Soap the night before surgery and the  morning of surgery.  2.  If you choose to wash your hair, wash your hair first as usual with your normal  shampoo.  3.  After you shampoo, rinse your hair and body thoroughly to remove the shampoo.                             4.  Use CHG as you would any other liquid soap.  You can apply chg directly to the skin and wash.  Gently with a scrungie or clean washcloth.  5.  Apply the CHG Soap to your body ONLY FROM THE NECK DOWN.   Do not use on face/ open                           Wound or open sores. Avoid contact with eyes, ears mouth and genitals (private parts).                       Wash face,  Genitals (private parts) with your normal soap.             6.  Wash thoroughly, paying special attention to the area where your  surgery  will be performed.  7.  Thoroughly rinse your body with warm water  from the neck down.  8.  DO NOT shower/wash with your normal soap after using and rinsing off the CHG Soap.            9.  Pat yourself dry with a clean towel.            10.  Wear clean pajamas.            11.  Place clean sheets on your bed the night of your first shower and do not  sleep with pets.  ON THE DAY OF SURGERY : Do not apply any lotions/deodorants the morning of surgery.  Please wear clean clothes to the hospital/surgery center.    FAILURE TO FOLLOW THESE INSTRUCTIONS MAY RESULT IN THE CANCELLATION OF YOUR SURGERY  PATIENT SIGNATURE_________________________________  NURSE SIGNATURE__________________________________  ________________________________________________________________________

## 2023-07-08 ENCOUNTER — Other Ambulatory Visit: Payer: Self-pay

## 2023-07-08 ENCOUNTER — Encounter (HOSPITAL_COMMUNITY)
Admission: RE | Admit: 2023-07-08 | Discharge: 2023-07-08 | Disposition: A | Discharge status: Home / Self Care | Source: Ambulatory Visit | Attending: Surgery | Admitting: Surgery

## 2023-07-08 ENCOUNTER — Encounter (HOSPITAL_COMMUNITY): Payer: Self-pay

## 2023-07-08 VITALS — BP 120/80 | HR 82 | Temp 98.7°F | Resp 20 | Ht 65.5 in | Wt 171.0 lb

## 2023-07-08 DIAGNOSIS — Z01818 Encounter for other preprocedural examination: Secondary | ICD-10-CM | POA: Insufficient documentation

## 2023-07-08 DIAGNOSIS — E039 Hypothyroidism, unspecified: Secondary | ICD-10-CM | POA: Insufficient documentation

## 2023-07-08 DIAGNOSIS — D696 Thrombocytopenia, unspecified: Secondary | ICD-10-CM | POA: Insufficient documentation

## 2023-07-08 DIAGNOSIS — K439 Ventral hernia without obstruction or gangrene: Secondary | ICD-10-CM | POA: Diagnosis not present

## 2023-07-08 DIAGNOSIS — Z87730 Personal history of (corrected) cleft lip and palate: Secondary | ICD-10-CM | POA: Diagnosis not present

## 2023-07-08 HISTORY — DX: Gastro-esophageal reflux disease without esophagitis: K21.9

## 2023-07-08 HISTORY — DX: Disease of blood and blood-forming organs, unspecified: D75.9

## 2023-07-08 HISTORY — DX: Hypothyroidism, unspecified: E03.9

## 2023-07-08 LAB — CBC
HCT: 44.9 % (ref 36.0–46.0)
Hemoglobin: 14.7 g/dL (ref 12.0–15.0)
MCH: 28.8 pg (ref 26.0–34.0)
MCHC: 32.7 g/dL (ref 30.0–36.0)
MCV: 87.9 fL (ref 80.0–100.0)
Platelets: 153 10*3/uL (ref 150–400)
RBC: 5.11 MIL/uL (ref 3.87–5.11)
RDW: 14.8 % (ref 11.5–15.5)
WBC: 5.7 10*3/uL (ref 4.0–10.5)
nRBC: 0 % (ref 0.0–0.2)

## 2023-07-08 LAB — BASIC METABOLIC PANEL WITH GFR
Anion gap: 5 (ref 5–15)
BUN: 11 mg/dL (ref 6–20)
CO2: 26 mmol/L (ref 22–32)
Calcium: 9.4 mg/dL (ref 8.9–10.3)
Chloride: 105 mmol/L (ref 98–111)
Creatinine, Ser: 0.76 mg/dL (ref 0.44–1.00)
GFR, Estimated: >60 mL/min (ref >60)
Glucose, Bld: 92 mg/dL (ref 70–99)
Potassium: 4.6 mmol/L (ref 3.5–5.1)
Sodium: 136 mmol/L (ref 135–145)
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
## 2023-07-13 ENCOUNTER — Ambulatory Visit (HOSPITAL_COMMUNITY)
Admission: RE | Admit: 2023-07-13 | Discharge: 2023-07-13 | Disposition: A | Discharge status: Home / Self Care | Attending: Surgery | Admitting: Surgery

## 2023-07-13 ENCOUNTER — Ambulatory Visit (HOSPITAL_COMMUNITY): Admitting: Physician Assistant

## 2023-07-13 ENCOUNTER — Encounter (HOSPITAL_COMMUNITY)
Admission: RE | Disposition: A | Discharge status: Home / Self Care | Payer: Self-pay | Source: Home / Self Care | Attending: Surgery

## 2023-07-13 ENCOUNTER — Encounter (HOSPITAL_COMMUNITY): Payer: Self-pay | Admitting: Surgery

## 2023-07-13 ENCOUNTER — Other Ambulatory Visit: Payer: Self-pay

## 2023-07-13 ENCOUNTER — Ambulatory Visit (HOSPITAL_COMMUNITY): Admitting: Anesthesiology

## 2023-07-13 DIAGNOSIS — R519 Headache, unspecified: Secondary | ICD-10-CM | POA: Insufficient documentation

## 2023-07-13 DIAGNOSIS — K219 Gastro-esophageal reflux disease without esophagitis: Secondary | ICD-10-CM | POA: Insufficient documentation

## 2023-07-13 DIAGNOSIS — E039 Hypothyroidism, unspecified: Secondary | ICD-10-CM | POA: Diagnosis not present

## 2023-07-13 DIAGNOSIS — Z01818 Encounter for other preprocedural examination: Secondary | ICD-10-CM

## 2023-07-13 DIAGNOSIS — K436 Other and unspecified ventral hernia with obstruction, without gangrene: Secondary | ICD-10-CM

## 2023-07-13 HISTORY — PX: EPIGASTRIC HERNIA REPAIR: SHX404

## 2023-07-13 LAB — POCT PREGNANCY, URINE: Preg Test, Ur: NEGATIVE

## 2023-07-13 SURGERY — REPAIR, HERNIA, EPIGASTRIC, ADULT
Anesthesia: General

## 2023-07-13 MED ORDER — CEFAZOLIN SODIUM-DEXTROSE 2-4 GM/100ML-% IV SOLN
2.0000 g | INTRAVENOUS | Status: AC
  Administered 2023-07-13: 2 g via INTRAVENOUS
  Filled 2023-07-13: qty 100

## 2023-07-13 MED ORDER — PROPOFOL 10 MG/ML IV BOLUS
INTRAVENOUS | Status: DC | PRN
  Administered 2023-07-13: 200 mg via INTRAVENOUS

## 2023-07-13 MED ORDER — DEXAMETHASONE SODIUM PHOSPHATE 10 MG/ML IJ SOLN
INTRAMUSCULAR | Status: AC
  Filled 2023-07-13: qty 1

## 2023-07-13 MED ORDER — LIDOCAINE HCL (PF) 2 % IJ SOLN
INTRAMUSCULAR | Status: AC
  Filled 2023-07-13: qty 5

## 2023-07-13 MED ORDER — OXYCODONE HCL 5 MG/5ML PO SOLN: 5.0000 mg | Freq: Once | ORAL | Status: DC | PRN

## 2023-07-13 MED ORDER — ACETAMINOPHEN 500 MG PO TABS: 1000.0000 mg | ORAL_TABLET | Freq: Once | ORAL | Status: DC

## 2023-07-13 MED ORDER — ONDANSETRON HCL 4 MG/2ML IJ SOLN
INTRAMUSCULAR | Status: AC
  Filled 2023-07-13: qty 2

## 2023-07-13 MED ORDER — ACETAMINOPHEN 650 MG RE SUPP
650.0000 mg | RECTAL | Status: DC | PRN
Start: 2023-07-13 — End: 2023-07-13

## 2023-07-13 MED ORDER — DOCUSATE SODIUM 100 MG PO CAPS: 100.0000 mg | ORAL_CAPSULE | Freq: Two times a day (BID) | ORAL | 0 refills | Status: AC

## 2023-07-13 MED ORDER — LACTATED RINGERS IV SOLN: INTRAVENOUS | Status: DC

## 2023-07-13 MED ORDER — GABAPENTIN 300 MG PO CAPS
300.0000 mg | ORAL_CAPSULE | ORAL | Status: AC
  Administered 2023-07-13: 300 mg via ORAL
  Filled 2023-07-13: qty 1

## 2023-07-13 MED ORDER — CHLORHEXIDINE GLUCONATE 4 % EX SOLN: 60.0000 mL | Freq: Once | CUTANEOUS | Status: DC

## 2023-07-13 MED ORDER — ONDANSETRON HCL 4 MG/2ML IJ SOLN
INTRAMUSCULAR | Status: DC | PRN
  Administered 2023-07-13: 4 mg via INTRAVENOUS

## 2023-07-13 MED ORDER — MIDAZOLAM HCL 2 MG/2ML IJ SOLN
INTRAMUSCULAR | Status: DC | PRN
  Administered 2023-07-13: 2 mg via INTRAVENOUS

## 2023-07-13 MED ORDER — AMISULPRIDE (ANTIEMETIC) 5 MG/2ML IV SOLN: 10.0000 mg | Freq: Once | INTRAVENOUS | Status: DC | PRN

## 2023-07-13 MED ORDER — OXYCODONE HCL 5 MG PO TABS: 5.0000 mg | ORAL_TABLET | Freq: Once | ORAL | Status: DC | PRN

## 2023-07-13 MED ORDER — FENTANYL CITRATE PF 50 MCG/ML IJ SOSY: 25.0000 ug | PREFILLED_SYRINGE | INTRAMUSCULAR | Status: DC | PRN

## 2023-07-13 MED ORDER — 0.9 % SODIUM CHLORIDE (POUR BTL) OPTIME
TOPICAL | Status: DC | PRN
  Administered 2023-07-13: 1000 mL

## 2023-07-13 MED ORDER — ROCURONIUM BROMIDE 10 MG/ML (PF) SYRINGE
PREFILLED_SYRINGE | INTRAVENOUS | Status: AC
  Filled 2023-07-13: qty 10

## 2023-07-13 MED ORDER — KETOROLAC TROMETHAMINE 30 MG/ML IJ SOLN
INTRAMUSCULAR | Status: DC | PRN
  Administered 2023-07-13: 30 mg via INTRAVENOUS

## 2023-07-13 MED ORDER — BUPIVACAINE-EPINEPHRINE (PF) 0.25% -1:200000 IJ SOLN
INTRAMUSCULAR | Status: AC
  Filled 2023-07-13: qty 30

## 2023-07-13 MED ORDER — ACETAMINOPHEN 325 MG PO TABS: 650.0000 mg | ORAL_TABLET | ORAL | Status: DC | PRN

## 2023-07-13 MED ORDER — FENTANYL CITRATE (PF) 100 MCG/2ML IJ SOLN
INTRAMUSCULAR | Status: DC | PRN
  Administered 2023-07-13 (×2): 25 ug via INTRAVENOUS
  Administered 2023-07-13: 50 ug via INTRAVENOUS

## 2023-07-13 MED ORDER — STERILE WATER FOR IRRIGATION IR SOLN
Status: DC | PRN
  Administered 2023-07-13: 1000 mL

## 2023-07-13 MED ORDER — ORAL CARE MOUTH RINSE: 15.0000 mL | Freq: Once | OROMUCOSAL | Status: AC

## 2023-07-13 MED ORDER — OXYCODONE HCL 5 MG PO TABS: 5.0000 mg | ORAL_TABLET | Freq: Three times a day (TID) | ORAL | 0 refills | Status: AC | PRN

## 2023-07-13 MED ORDER — BUPIVACAINE-EPINEPHRINE 0.25% -1:200000 IJ SOLN
INTRAMUSCULAR | Status: DC | PRN
  Administered 2023-07-13: 30 mL

## 2023-07-13 MED ORDER — MIDAZOLAM HCL 2 MG/2ML IJ SOLN
INTRAMUSCULAR | Status: AC
  Filled 2023-07-13: qty 2

## 2023-07-13 MED ORDER — ACETAMINOPHEN 500 MG PO TABS
1000.0000 mg | ORAL_TABLET | ORAL | Status: AC
  Administered 2023-07-13: 1000 mg via ORAL
  Filled 2023-07-13: qty 2

## 2023-07-13 MED ORDER — FENTANYL CITRATE (PF) 100 MCG/2ML IJ SOLN
INTRAMUSCULAR | Status: AC
Start: 2023-07-13 — End: ?
  Filled 2023-07-13: qty 2

## 2023-07-13 MED ORDER — LIDOCAINE HCL (CARDIAC) PF 100 MG/5ML IV SOSY
PREFILLED_SYRINGE | INTRAVENOUS | Status: DC | PRN
  Administered 2023-07-13: 100 mg via INTRAVENOUS

## 2023-07-13 MED ORDER — OXYCODONE HCL 5 MG PO TABS: 5.0000 mg | ORAL_TABLET | ORAL | Status: DC | PRN

## 2023-07-13 MED ORDER — KETOROLAC TROMETHAMINE 30 MG/ML IJ SOLN
INTRAMUSCULAR | Status: AC
  Filled 2023-07-13: qty 1

## 2023-07-13 MED ORDER — PROPOFOL 10 MG/ML IV BOLUS
INTRAVENOUS | Status: AC
  Filled 2023-07-13: qty 20

## 2023-07-13 MED ORDER — CHLORHEXIDINE GLUCONATE 0.12 % MT SOLN
15.0000 mL | Freq: Once | OROMUCOSAL | Status: AC
  Administered 2023-07-13: 15 mL via OROMUCOSAL

## 2023-07-13 MED ORDER — BUPIVACAINE LIPOSOME 1.3 % IJ SUSP
INTRAMUSCULAR | Status: AC
  Filled 2023-07-13: qty 20

## 2023-07-13 MED ORDER — DEXMEDETOMIDINE HCL IN NACL 80 MCG/20ML IV SOLN
INTRAVENOUS | Status: DC | PRN
  Administered 2023-07-13: 12 ug via INTRAVENOUS

## 2023-07-13 MED ORDER — DEXAMETHASONE SODIUM PHOSPHATE 10 MG/ML IJ SOLN
INTRAMUSCULAR | Status: DC | PRN
  Administered 2023-07-13: 10 mg via INTRAVENOUS

## 2023-07-13 SURGICAL SUPPLY — 27 items
BAG COUNTER SPONGE SURGICOUNT (BAG) IMPLANT
BENZOIN TINCTURE PRP APPL 2/3 (GAUZE/BANDAGES/DRESSINGS) IMPLANT
CHLORAPREP W/TINT 26 (MISCELLANEOUS) ×1 IMPLANT
CLSR STERI-STRIP ANTIMIC 1/2X4 (GAUZE/BANDAGES/DRESSINGS) IMPLANT
COVER SURGICAL LIGHT HANDLE (MISCELLANEOUS) ×1 IMPLANT
DRAPE LAPAROSCOPIC ABDOMINAL (DRAPES) ×1 IMPLANT
ELECT REM PT RETURN 15FT ADLT (MISCELLANEOUS) ×1 IMPLANT
GAUZE SPONGE 4X4 12PLY STRL (GAUZE/BANDAGES/DRESSINGS) IMPLANT
GLOVE BIO SURGEON STRL SZ 6 (GLOVE) ×1 IMPLANT
GLOVE INDICATOR 6.5 STRL GRN (GLOVE) ×1 IMPLANT
GLOVE SS BIOGEL STRL SZ 6 (GLOVE) ×1 IMPLANT
GOWN STRL REUS W/ TWL LRG LVL3 (GOWN DISPOSABLE) ×1 IMPLANT
GOWN STRL REUS W/ TWL XL LVL3 (GOWN DISPOSABLE) IMPLANT
KIT BASIN OR (CUSTOM PROCEDURE TRAY) ×1 IMPLANT
KIT TURNOVER KIT A (KITS) IMPLANT
NDL HYPO 22X1.5 SAFETY MO (MISCELLANEOUS) IMPLANT
NEEDLE HYPO 22X1.5 SAFETY MO (MISCELLANEOUS) IMPLANT
PACK GENERAL/GYN (CUSTOM PROCEDURE TRAY) ×1 IMPLANT
SPIKE FLUID TRANSFER (MISCELLANEOUS) ×1 IMPLANT
STRIP CLOSURE SKIN 1/2X4 (GAUZE/BANDAGES/DRESSINGS) IMPLANT
SUT ETHIBOND 0 MO6 C/R (SUTURE) IMPLANT
SUT MNCRL AB 4-0 PS2 18 (SUTURE) ×1 IMPLANT
SUT PROLENE 2 0 CT2 30 (SUTURE) IMPLANT
SUT VIC AB 3-0 SH 27XBRD (SUTURE) IMPLANT
SYR CONTROL 10ML LL (SYRINGE) IMPLANT
TAPE CLOTH SOFT 2X10 (GAUZE/BANDAGES/DRESSINGS) IMPLANT
TOWEL OR 17X26 10 PK STRL BLUE (TOWEL DISPOSABLE) ×1 IMPLANT

## 2023-07-13 NOTE — Discharge Instructions (Signed)
 HERNIA REPAIR: POST OP INSTRUCTIONS   EAT Gradually transition to a high fiber diet with a fiber supplement over the next few weeks after discharge.  Start with a pureed / full liquid diet (see below)  WALK Walk an hour a day (cumulative- not all at once).  Control your pain to do that.    CONTROL PAIN Control pain so that you can walk, sleep, tolerate sneezing/coughing, and go up/down stairs.  HAVE A BOWEL MOVEMENT DAILY Keep your bowels regular to avoid problems.  OK to try a laxative to override constipation.  OK to use an antidiarrheal to slow down diarrhea.  Call if not better after 2 tries  CALL IF YOU HAVE PROBLEMS/CONCERNS Call if you are still struggling despite following these instructions. Call if you have concerns not answered by these instructions  ######################################################################    DIET: Follow a light bland diet & liquids the first 24 hours after arrival home, such as soup, liquids, starches, etc.  Be sure to drink plenty of fluids.  Quickly advance to a usual solid diet within a few days.  Avoid fast food or heavy meals initially as you are more likely to get nauseated or have irregular bowels.  A low-sugar, high-fiber diet for the rest of your life is ideal.   Take your usually prescribed home medications unless otherwise directed.  PAIN CONTROL: Pain is best controlled by a usual combination of three different methods TOGETHER: Ice/Heat Over the counter pain medication Prescription pain medication Most patients will experience some swelling and bruising around the hernia(s) such as the bellybutton, groins, or old incisions.  Ice packs or heating pads (30-60 minutes up to 6 times a day) will help. Use ice for the first few days to help decrease swelling and bruising, then switch to heat to help relax tight/sore spots and speed recovery.  Some people prefer to use ice alone, heat alone, alternating between ice & heat.  Experiment  to what works for you.  Swelling and bruising can take several weeks to resolve.   It is helpful to take an over-the-counter pain medication regularly for the first days: Naproxen  (Aleve , etc)  Two 220mg  tabs twice a day OR Ibuprofen  (Advil , etc) Three 200mg  tabs four times a day (every meal & bedtime) AND Acetaminophen  (Tylenol , etc) 325-650mg  four times a day (every meal & bedtime) A  prescription for pain medication should be given to you upon discharge.  Take your pain medication as prescribed, IF NEEDED.  If you are having problems/concerns with the prescription medicine (does not control pain, nausea, vomiting, rash, itching, etc), please call us  [phone] to see if we need to switch you to a different pain medicine that will work better for you and/or control your side effect better. If you need a refill on your pain medication, please contact your pharmacy.  They will contact our office to request authorization. Prescriptions will not be filled after 5 pm or on week-ends.  Avoid getting constipated.  Between the surgery and the pain medications, it is common to experience some constipation.  Increasing fluid intake and taking a fiber supplement (such as Metamucil, Citrucel, FiberCon, MiraLax, etc) 1-2 times a day regularly will usually help prevent this problem from occurring.  A mild laxative (prune juice, Milk of Magnesia, MiraLax, etc) should be taken according to package directions if there are no bowel movements after 48 hours.    Wash / shower every day, starting 2 days after surgery.  You may shower over  the steri strips which are waterproof.  No rubbing, scrubbing, lotions or ointments to incision(s). Do not soak or submerge incision.   Remove your outer bandage (gauze and tape) 2 days after surgery. Steri strips (small white tapes) will peel off after 1-2 weeks. You may leave the incision open to air.  You may replace a dressing/Band-Aid to cover an incision for comfort if you wish.   Continue to shower over incision(s) after the dressing is off.  ACTIVITIES as tolerated:   You may resume regular (light) daily activities beginning the next day--such as daily self-care, walking, climbing stairs--gradually increasing activities as tolerated.  Control your pain so that you can walk an hour a day.  If you can walk 30 minutes without difficulty, it is safe to try more intense activity such as jogging, treadmill, bicycling, low-impact aerobics, swimming, etc. Refrain from the most intensive and strenuous activity such as sit-ups, heavy lifting, contact sports, etc  Refrain from any heavy lifting or straining until 6 weeks after surgery.   DO NOT PUSH THROUGH PAIN.  Let pain be your guide: If it hurts to do something, don't do it.  Pain is your body warning you to avoid that activity for another week until the pain goes down. You may drive when you are no longer taking prescription pain medication, you can comfortably wear a seatbelt, and you can safely maneuver your car and apply brakes. You may have sexual intercourse when it is comfortable.   FOLLOW UP in our office Please call [facility] at [phone] to set up an appointment to see your surgeon in the office for a follow-up appointment approximately 2-3 weeks after your surgery. Make sure that you call for this appointment the day you arrive home to insure a convenient appointment time.  9.  If you have disability of FMLA / Family leave forms, please bring the forms to the office for processing.  (do not give to your surgeon).  WHEN TO CALL US  [phone]: Poor pain control Reactions / problems with new medications (rash/itching, nausea, etc)  Fever over 101.5 F (38.5 C) Inability to urinate Nausea and/or vomiting Worsening swelling or bruising Continued bleeding from incision. Increased pain, redness, or drainage from the incision   The clinic staff is available to answer your questions during regular business hours  (8:30am-5pm).  Please don't hesitate to call and ask to speak to one of our nurses for clinical concerns.   If you have a medical emergency, go to the nearest emergency room or call 911.  A surgeon from [facility] is always on call at the hospitals in [facility], [place] [address], [place], [place]  [address] ?  [address] [address], [place], [place]   [address] MAIN: [phone] ? TOLL FREE: [phone] ? FAX: [phone] [email or web address]

## 2023-07-13 NOTE — Op Note (Signed)
 Operative Note  Kimberly Ramsey  [id number]  [id number]  07/13/2023   Surgeon: Aldon Hung MD FACS   Procedure performed: Open repair of chronically incarcerated epigastric hernia, fascial defect 7 mm   Preop diagnosis: Chronically incarcerated epigastric hernia, fascial defect not palpable Post-op diagnosis/intraop findings: Same, containing preperitoneal fat/falciform ligament, fascial defect 7 mm   Specimens: no Retained items: no  EBL: minimal  Complications: none   Description of procedure: After confirming informed consent the patient was taken to the operating room and placed supine on operating room table where general LMA anesthesia was initiated, preoperative antibiotics were administered, SCDs applied, and a formal timeout was performed.  The abdomen was prepped and draped in usual sterile fashion.  After infiltration with local (0.25% marcaine  with epinephrine), a small vertical midline incision was made just above the umbilicus in the region of the palpable subcutaneous mass.  The soft tissues were dissected with cautery until the hernia sac was encountered.  This was dissected circumferentially down to the level of the fascia where the hernia sac was excised and noted to contain chronically incarcerated preperitoneal fat and falciform ligament.  These contents were partially amputated ensuring hemostasis with cautery.  The remaining preperitoneal fat was reduced and the fascial defect cleared off circumferentially and noted to measure approximately 7 mm transverse by 3 mm cephalocaudad.  This was closed transversely with simple interrupted 0 Ethibond sutures.  Additional local was infiltrated in the fascia and surrounding soft tissue.  The wound was then closed with deep soft tissue simple interrupted 3-0 Vicryl's followed by deep dermal simple interrupted 3-0 Vicryl's and running subcuticular 4-0 Monocryl.  Benzoin, Steri-Strips, and a light pressure dressing of gauze and tape were  then applied.  The patient was then awakened, extubated and taken to PACU in stable condition.    All counts were correct at the completion of the case.

## 2023-07-13 NOTE — Anesthesia Preprocedure Evaluation (Addendum)
 Anesthesia Evaluation  Patient identified by MRN, date of birth, ID band Patient awake    Reviewed: Allergy & Precautions, NPO status , Patient's Chart, lab work & pertinent test results  Airway Mallampati: II  TM Distance: >3 FB Neck ROM: Full    Dental  (+) Dental Advisory Given, Teeth Intact Bottom permanent retainer:   Pulmonary neg pulmonary ROS   Pulmonary exam normal breath sounds clear to auscultation       Cardiovascular negative cardio ROS Normal cardiovascular exam Rhythm:Regular Rate:Normal     Neuro/Psych  Headaches  negative psych ROS   GI/Hepatic Neg liver ROS,GERD  Controlled,,  Endo/Other  Hypothyroidism    Renal/GU negative Renal ROS  negative genitourinary   Musculoskeletal negative musculoskeletal ROS (+)    Abdominal   Peds  Hematology negative hematology ROS (+)   Anesthesia Other Findings   Reproductive/Obstetrics                             Anesthesia Physical Anesthesia Plan  ASA: 2  Anesthesia Plan: General   Post-op Pain Management: Tylenol  PO (pre-op)*   Induction: Intravenous  PONV Risk Score and Plan: 3 and Midazolam , Dexamethasone and Ondansetron   Airway Management Planned: Oral ETT  Additional Equipment:   Intra-op Plan:   Post-operative Plan: Extubation in OR  Informed Consent: I have reviewed the patients History and Physical, chart, labs and discussed the procedure including the risks, benefits and alternatives for the proposed anesthesia with the patient or authorized representative who has indicated his/her understanding and acceptance.     Dental advisory given  Plan Discussed with: CRNA  Anesthesia Plan Comments:        Anesthesia Quick Evaluation

## 2023-07-13 NOTE — Anesthesia Procedure Notes (Signed)
 Procedure Name: LMA Insertion Date/Time: 07/13/2023 9:50 AM  Performed by: Woodrum, Chelsey L, MDPatient Re-evaluated:Patient Re-evaluated prior to induction Oxygen Delivery Method: Circle system utilized Preoxygenation: Pre-oxygenation with 100% oxygen Induction Type: IV induction LMA: LMA inserted LMA Size: 4.0 Number of attempts: 1 Placement Confirmation: positive ETCO2 and breath sounds checked- equal and bilateral Tube secured with: Tape Dental Injury: Teeth and Oropharynx as per pre-operative assessment

## 2023-07-13 NOTE — Anesthesia Postprocedure Evaluation (Signed)
 Anesthesia Post Note  Patient: Kimberly Ramsey  Procedure(s) Performed: REPAIR, HERNIA, EPIGASTRIC, ADULT     Patient location during evaluation: PACU Anesthesia Type: General Level of consciousness: awake and alert Pain management: pain level controlled Vital Signs Assessment: post-procedure vital signs reviewed and stable Respiratory status: spontaneous breathing, nonlabored ventilation, respiratory function stable and patient connected to nasal cannula oxygen Cardiovascular status: blood pressure returned to baseline and stable Postop Assessment: no apparent nausea or vomiting Anesthetic complications: no  No notable events documented.  Last Vitals:  Vitals:   07/13/23 1200 07/13/23 1215  BP: 110/67 (!) 124/91  Pulse: 74 85  Resp: 16 16  Temp:  36.7 C  SpO2: 97% 97%    Last Pain:  Vitals:   07/13/23 1215  TempSrc: Oral  PainSc: 0-No pain                 Sapir Lavey L Janete Quilling

## 2023-07-13 NOTE — Interval H&P Note (Signed)
 History and Physical Interval Note:  07/13/2023 8:11 AM  Kimberly Ramsey  has presented today for surgery, with the diagnosis of EPIGASTRIC HERNIA.  The various methods of treatment have been discussed with the patient and family. After consideration of risks, benefits and other options for treatment, the patient has consented to  Procedure(s): REPAIR, HERNIA, EPIGASTRIC, ADULT (N/A) as a surgical intervention.  The patient's history has been reviewed, patient examined, no change in status, stable for surgery.  I have reviewed the patient's chart and labs.  Questions were answered to the patient's satisfaction.     Denisia Harpole Loyola Rummage

## 2023-07-13 NOTE — Transfer of Care (Signed)
 Immediate Anesthesia Transfer of Care Note  Patient: Kimberly Ramsey  Procedure(s) Performed: REPAIR, HERNIA, EPIGASTRIC, ADULT  Patient Location: PACU  Anesthesia Type:General  Level of Consciousness: drowsy  Airway & Oxygen Therapy: Patient Spontanous Breathing and Patient connected to face mask oxygen  Post-op Assessment: Report given to RN and Post -op Vital signs reviewed and stable  Post vital signs: Reviewed and stable  Last Vitals:  Vitals Value Taken Time  BP    Temp    Pulse 107 07/13/23 1039  Resp 21 07/13/23 1039  SpO2 100 % 07/13/23 1039  Vitals shown include unfiled device data.  Last Pain:  Vitals:   07/13/23 0801  TempSrc:   PainSc: 0-No pain         Complications: No notable events documented.

## 2023-07-14 ENCOUNTER — Encounter (HOSPITAL_COMMUNITY): Payer: Self-pay | Admitting: Surgery

## 2023-07-15 ENCOUNTER — Encounter: Payer: Self-pay | Admitting: Women's Health

## 2023-07-19 ENCOUNTER — Ambulatory Visit: Admitting: Women's Health

## 2023-07-21 ENCOUNTER — Ambulatory Visit: Admitting: Adult Health

## 2023-07-21 ENCOUNTER — Encounter: Payer: Self-pay | Admitting: Adult Health

## 2023-07-21 VITALS — BP 121/83 | HR 84 | Ht 66.0 in | Wt 171.0 lb

## 2023-07-21 DIAGNOSIS — Z30431 Encounter for routine checking of intrauterine contraceptive device: Secondary | ICD-10-CM

## 2023-07-21 NOTE — Progress Notes (Signed)
  Subjective:     Patient ID: Kimberly Ramsey, female   DOB: 1985-07-14, 38 y.o.   MRN: [id number]  HPI Kimberly Ramsey is a 38 year old black female, single, [id number], in for IUD check, she had mirena  inserted 06/16/23. Period started 5/23 and still spotting.     Component Value Date/Time   DIAGPAP  05/08/2021 1044    - Negative for intraepithelial lesion or malignancy (NILM)   DIAGPAP  05/01/2018 0000    NEGATIVE FOR INTRAEPITHELIAL LESIONS OR MALIGNANCY.   HPVHIGH Negative 05/08/2021 1044   ADEQPAP  05/08/2021 1044    Satisfactory for evaluation; transformation zone component PRESENT.   ADEQPAP  05/01/2018 0000    Satisfactory for evaluation  endocervical/transformation zone component PRESENT.    PCP is S  Early NP  Review of Systems For IUD check Spotting from 07/01/23 Reviewed past medical,surgical, social and family history. Reviewed medications and allergies.     Objective:   Physical Exam BP 121/83 (BP Location: Left Arm, Patient Position: Sitting, Cuff Size: Normal)   Pulse 84   Ht 5' 6 (1.676 m)   Wt 171 lb (77.6 kg)   LMP 07/01/2023 (Exact Date)   BMI 27.60 kg/m     Skin warm and dry.Pelvic: external genitalia is normal in appearance no lesions, vagina:pink, urethra has no lesions or masses noted, cervix:smooth and bulbous,+IUD strings at os, uterus: normal size, shape and contour, non tender, no masses felt, adnexa: no masses or tenderness noted. Bladder is non tender and no masses felt.  Upstream - 07/21/23 1215       Pregnancy Intention Screening   Does the patient want to become pregnant in the next year? No    Does the patient's partner want to become pregnant in the next year? No    Would the patient like to discuss contraceptive options today? No      Contraception Wrap Up   Current Method IUD or IUS    End Method IUD or IUS    Contraception Counseling Provided Yes         Examination chaperoned by Alphonso Aschoff LPN  Assessment:     1. IUD check up  (Primary) Mirena  placed 06/16/23 +strings at os     Plan:     Return in 1 year for pap and physical

## 2023-09-27 LAB — TSH: TSH: 2.33 u[IU]/mL (ref 0.450–4.500)

## 2023-09-27 LAB — VITAMIN D 25 HYDROXY (VIT D DEFICIENCY, FRACTURES): Vit D, 25-Hydroxy: 38.9 ng/mL (ref 30.0–100.0)

## 2023-09-27 LAB — LIPID PANEL
Chol/HDL Ratio: 3.7 ratio (ref 0.0–4.4)
Cholesterol, Total: 155 mg/dL (ref 100–199)
HDL: 42 mg/dL (ref >39)
LDL Chol Calc (NIH): 99 mg/dL (ref 0–99)
Triglycerides: 73 mg/dL (ref 0–149)
VLDL Cholesterol Cal: 14 mg/dL (ref 5–40)

## 2023-09-27 LAB — T4, FREE: Free T4: 1.23 ng/dL (ref 0.82–1.77)

## 2023-10-05 ENCOUNTER — Ambulatory Visit: Payer: 59 | Admitting: "Endocrinology

## 2023-10-05 ENCOUNTER — Encounter: Payer: Self-pay | Admitting: "Endocrinology

## 2023-10-05 VITALS — BP 124/86 | HR 72 | Ht 66.0 in | Wt 173.8 lb

## 2023-10-05 DIAGNOSIS — E890 Postprocedural hypothyroidism: Secondary | ICD-10-CM

## 2023-10-05 DIAGNOSIS — E559 Vitamin D deficiency, unspecified: Secondary | ICD-10-CM

## 2023-10-05 MED ORDER — LEVOTHYROXINE SODIUM 75 MCG PO TABS: 75.0000 ug | ORAL_TABLET | Freq: Every day | ORAL | 1 refills | Status: AC
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
## 2024-01-18 ENCOUNTER — Ambulatory Visit: Attending: Cardiovascular Disease | Admitting: Cardiovascular Disease

## 2024-01-18 ENCOUNTER — Encounter: Payer: Self-pay | Admitting: Cardiovascular Disease

## 2024-01-18 VITALS — BP 127/87 | HR 87 | Ht 65.0 in | Wt 178.6 lb

## 2024-01-18 DIAGNOSIS — Z8249 Family history of ischemic heart disease and other diseases of the circulatory system: Secondary | ICD-10-CM

## 2024-01-18 DIAGNOSIS — E0590 Thyrotoxicosis, unspecified without thyrotoxic crisis or storm: Secondary | ICD-10-CM

## 2024-01-18 NOTE — Patient Instructions (Addendum)
 Medication Instructions:   No changes *If you need a refill on your cardiac medications before your next appointment, please call your pharmacy*   Lab Work: Not needed If you have labs (blood work) drawn today and your tests are completely normal, you will receive your results only by: MyChart Message (if you have MyChart) OR A paper copy in the mail If you have any lab test that is abnormal or we need to change your treatment, we will call you to review the results.   Testing/Procedures: 1)Your physician has requested that you have an echocardiogram. Echocardiography is a painless test that uses sound waves to create images of your heart. It provides your doctor with information about the size and shape of your heart and how well your hearts chambers and valves are working. This procedure takes approximately one hour. There are no restrictions for this procedure. Please do NOT wear cologne, perfume, aftershave, or lotions (deodorant is allowed). Please arrive 15 minutes prior to your appointment time.  Please note: We ask at that you not bring children with you during ultrasound (echo/ vascular) testing. Due to room size and safety concerns, children are not allowed in the ultrasound rooms during exams. Our front office staff cannot provide observation of children in our lobby area while testing is being conducted. An adult accompanying a patient to their appointment will only be allowed in the ultrasound room at the discretion of the ultrasound technician under special circumstances. We apologize for any inconvenience.   2) Your provider would like for you to have a Calcium Score CT. This test is painless. This is a non-contrast CT of the heat to look for calcified lesions in the coronary arteries. The cost of this is test cost $99 out of pocket and is not submitted to your insurance. The test can be performed at our [facility] Location in [place].  You can get the test  scheduled in our office or you can call [phone].  Then press option 4, Then press option 2 Finally press option 2 and get your test scheduled.    Follow-Up: At [facility], you and your health needs are our priority.  As part of our continuing mission to provide you with exceptional heart care, we have created designated Provider Care Teams.  These Care Teams include your primary Cardiologist (physician) and Advanced Practice Providers (APPs -  Physician Assistants and Nurse Practitioners) who all work together to provide you with the care you need, when you need it.     Your next appointment:   12 month visit  The format for your next appointment:   In Person  Provider:   Maude Emmer, MD

## 2024-02-11 ENCOUNTER — Emergency Department (HOSPITAL_BASED_OUTPATIENT_CLINIC_OR_DEPARTMENT_OTHER)
Admission: EM | Admit: 2024-02-11 | Discharge: 2024-02-11 | Disposition: A | Discharge status: Home / Self Care | Source: Home / Self Care | Attending: Emergency Medicine | Admitting: Emergency Medicine

## 2024-02-11 ENCOUNTER — Encounter (HOSPITAL_BASED_OUTPATIENT_CLINIC_OR_DEPARTMENT_OTHER): Payer: Self-pay | Admitting: *Deleted

## 2024-02-11 ENCOUNTER — Other Ambulatory Visit: Payer: Self-pay

## 2024-02-11 DIAGNOSIS — E039 Hypothyroidism, unspecified: Secondary | ICD-10-CM | POA: Insufficient documentation

## 2024-02-11 DIAGNOSIS — G43809 Other migraine, not intractable, without status migrainosus: Secondary | ICD-10-CM | POA: Insufficient documentation

## 2024-02-11 DIAGNOSIS — R519 Headache, unspecified: Secondary | ICD-10-CM | POA: Diagnosis present

## 2024-02-11 MED ORDER — DIPHENHYDRAMINE HCL 50 MG/ML IJ SOLN
12.5000 mg | Freq: Once | INTRAMUSCULAR | Status: AC
  Administered 2024-02-11: 12.5 mg via INTRAVENOUS
  Filled 2024-02-11: qty 1

## 2024-02-11 MED ORDER — MAGNESIUM SULFATE 2 GM/50ML IV SOLN
2.0000 g | Freq: Once | INTRAVENOUS | Status: AC
  Administered 2024-02-11: 2 g via INTRAVENOUS
  Filled 2024-02-11: qty 50

## 2024-02-11 MED ORDER — PROCHLORPERAZINE EDISYLATE 10 MG/2ML IJ SOLN
10.0000 mg | Freq: Once | INTRAMUSCULAR | Status: AC
  Administered 2024-02-11: 10 mg via INTRAVENOUS
  Filled 2024-02-11: qty 2

## 2024-02-11 NOTE — Discharge Instructions (Signed)
 You were seen today for migraine.  Follow-up with your neurologist for ongoing options for rescue medications.
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
## 2024-02-11 NOTE — ED Triage Notes (Signed)
 Pt reports for two days she has had a migraine headache (frontal, right sided) associated with nausea, dizziness and photophobia. She has taken her prescribed sumatriptan and gabapentin  doses without relief.

## 2024-03-01 ENCOUNTER — Ambulatory Visit (HOSPITAL_COMMUNITY)
Admission: RE | Admit: 2024-03-01 | Discharge: 2024-03-01 | Disposition: A | Discharge status: Home / Self Care | Payer: Self-pay | Source: Ambulatory Visit | Attending: Cardiovascular Disease | Admitting: Cardiovascular Disease

## 2024-03-01 ENCOUNTER — Ambulatory Visit (HOSPITAL_COMMUNITY)
Admission: RE | Admit: 2024-03-01 | Discharge: 2024-03-01 | Disposition: A | Discharge status: Home / Self Care | Source: Ambulatory Visit | Attending: Cardiovascular Disease | Admitting: Cardiovascular Disease

## 2024-03-01 DIAGNOSIS — Z8249 Family history of ischemic heart disease and other diseases of the circulatory system: Secondary | ICD-10-CM | POA: Insufficient documentation

## 2024-03-01 DIAGNOSIS — E0590 Thyrotoxicosis, unspecified without thyrotoxic crisis or storm: Secondary | ICD-10-CM | POA: Diagnosis present

## 2024-03-01 LAB — ECHOCARDIOGRAM COMPLETE
Area-P 1/2: 6.65 cm2
S' Lateral: 2.8 cm

## 2024-03-02 ENCOUNTER — Ambulatory Visit: Payer: Self-pay | Admitting: Cardiovascular Disease

## 2024-04-06 ENCOUNTER — Ambulatory Visit: Admitting: "Endocrinology
